# Patient Record
Sex: Female | Born: 1965 | Race: White | Hispanic: No | Smoking: Former smoker
Health system: Southern US, Community
[De-identification: ages and names within clinical notes are randomized; demographics above are authoritative.]

## PROBLEM LIST (undated history)

## (undated) DIAGNOSIS — O24419 Gestational diabetes mellitus in pregnancy, unspecified control: Secondary | ICD-10-CM

## (undated) DIAGNOSIS — R87619 Unspecified abnormal cytological findings in specimens from cervix uteri: Secondary | ICD-10-CM

## (undated) DIAGNOSIS — M2550 Pain in unspecified joint: Secondary | ICD-10-CM

## (undated) DIAGNOSIS — K589 Irritable bowel syndrome without diarrhea: Secondary | ICD-10-CM

## (undated) DIAGNOSIS — F329 Major depressive disorder, single episode, unspecified: Secondary | ICD-10-CM

## (undated) DIAGNOSIS — F419 Anxiety disorder, unspecified: Secondary | ICD-10-CM

## (undated) DIAGNOSIS — F32A Depression, unspecified: Secondary | ICD-10-CM

## (undated) DIAGNOSIS — IMO0001 Reserved for inherently not codable concepts without codable children: Secondary | ICD-10-CM

## (undated) DIAGNOSIS — G8929 Other chronic pain: Secondary | ICD-10-CM

## (undated) DIAGNOSIS — K219 Gastro-esophageal reflux disease without esophagitis: Secondary | ICD-10-CM

## (undated) DIAGNOSIS — A0472 Enterocolitis due to Clostridium difficile, not specified as recurrent: Secondary | ICD-10-CM

## (undated) HISTORY — PX: ESOPHAGOGASTRODUODENOSCOPY: SHX1529

## (undated) HISTORY — DX: Gastro-esophageal reflux disease without esophagitis: K21.9

## (undated) HISTORY — DX: Depression, unspecified: F32.A

## (undated) HISTORY — PX: TONSILLECTOMY: SUR1361

## (undated) HISTORY — PX: SHOULDER SURGERY: SHX246

## (undated) HISTORY — DX: Anxiety disorder, unspecified: F41.9

## (undated) HISTORY — PX: BREAST BIOPSY: SHX20

## (undated) HISTORY — DX: Unspecified abnormal cytological findings in specimens from cervix uteri: R87.619

## (undated) HISTORY — DX: Pain in unspecified joint: M25.50

## (undated) HISTORY — DX: Enterocolitis due to Clostridium difficile, not specified as recurrent: A04.72

## (undated) HISTORY — PX: REDUCTION MAMMAPLASTY: SUR839

## (undated) HISTORY — DX: Irritable bowel syndrome, unspecified: K58.9

## (undated) HISTORY — DX: Major depressive disorder, single episode, unspecified: F32.9

## (undated) HISTORY — DX: Gestational diabetes mellitus in pregnancy, unspecified control: O24.419

## (undated) HISTORY — DX: Other chronic pain: G89.29

## (undated) HISTORY — DX: Reserved for inherently not codable concepts without codable children: IMO0001

---

## 1991-01-03 HISTORY — PX: TUBAL LIGATION: SHX77

## 1993-01-02 HISTORY — PX: LAPAROSCOPIC CHOLECYSTECTOMY: SUR755

## 2000-01-03 HISTORY — PX: ABDOMINAL HYSTERECTOMY: SHX81

## 2001-04-11 ENCOUNTER — Encounter: Payer: Self-pay | Admitting: Obstetrics and Gynecology

## 2001-04-11 ENCOUNTER — Encounter: Admission: RE | Admit: 2001-04-11 | Discharge: 2001-04-11 | Payer: Self-pay | Admitting: Obstetrics and Gynecology

## 2002-04-28 ENCOUNTER — Ambulatory Visit (HOSPITAL_COMMUNITY): Admission: RE | Admit: 2002-04-28 | Discharge: 2002-04-28 | Payer: Self-pay | Admitting: Internal Medicine

## 2002-04-29 ENCOUNTER — Encounter: Payer: Self-pay | Admitting: Internal Medicine

## 2002-04-29 ENCOUNTER — Ambulatory Visit (HOSPITAL_COMMUNITY): Admission: RE | Admit: 2002-04-29 | Discharge: 2002-04-29 | Payer: Self-pay | Admitting: Internal Medicine

## 2002-09-06 ENCOUNTER — Encounter: Payer: Self-pay | Admitting: Family Medicine

## 2002-09-06 ENCOUNTER — Emergency Department (HOSPITAL_COMMUNITY): Admission: EM | Admit: 2002-09-06 | Discharge: 2002-09-06 | Payer: Self-pay | Admitting: *Deleted

## 2002-09-24 ENCOUNTER — Encounter: Payer: Self-pay | Admitting: Family Medicine

## 2002-09-24 ENCOUNTER — Ambulatory Visit (HOSPITAL_COMMUNITY): Admission: RE | Admit: 2002-09-24 | Discharge: 2002-09-24 | Payer: Self-pay | Admitting: Family Medicine

## 2002-09-30 ENCOUNTER — Encounter: Payer: Self-pay | Admitting: Family Medicine

## 2002-09-30 ENCOUNTER — Ambulatory Visit (HOSPITAL_COMMUNITY): Admission: RE | Admit: 2002-09-30 | Discharge: 2002-09-30 | Payer: Self-pay | Admitting: Family Medicine

## 2002-12-05 ENCOUNTER — Ambulatory Visit (HOSPITAL_COMMUNITY): Admission: RE | Admit: 2002-12-05 | Discharge: 2002-12-05 | Payer: Self-pay | Admitting: Gastroenterology

## 2003-01-03 HISTORY — PX: BACK SURGERY: SHX140

## 2003-01-15 ENCOUNTER — Ambulatory Visit (HOSPITAL_COMMUNITY): Admission: RE | Admit: 2003-01-15 | Discharge: 2003-01-15 | Payer: Self-pay | Admitting: Family Medicine

## 2003-03-26 ENCOUNTER — Inpatient Hospital Stay (HOSPITAL_COMMUNITY): Admission: RE | Admit: 2003-03-26 | Discharge: 2003-03-28 | Payer: Self-pay | Admitting: Neurosurgery

## 2006-06-18 ENCOUNTER — Encounter (HOSPITAL_COMMUNITY): Admission: RE | Admit: 2006-06-18 | Discharge: 2006-07-18 | Payer: Self-pay | Admitting: Family Medicine

## 2006-07-27 ENCOUNTER — Ambulatory Visit (HOSPITAL_COMMUNITY): Admission: RE | Admit: 2006-07-27 | Discharge: 2006-07-27 | Payer: Self-pay | Admitting: Obstetrics & Gynecology

## 2007-08-19 ENCOUNTER — Other Ambulatory Visit: Admission: RE | Admit: 2007-08-19 | Discharge: 2007-08-19 | Payer: Self-pay | Admitting: Obstetrics and Gynecology

## 2007-08-23 ENCOUNTER — Ambulatory Visit (HOSPITAL_COMMUNITY): Admission: RE | Admit: 2007-08-23 | Discharge: 2007-08-23 | Payer: Self-pay | Admitting: Obstetrics & Gynecology

## 2008-03-09 ENCOUNTER — Ambulatory Visit (HOSPITAL_COMMUNITY): Admission: RE | Admit: 2008-03-09 | Discharge: 2008-03-09 | Payer: Self-pay | Admitting: Family Medicine

## 2008-08-24 ENCOUNTER — Ambulatory Visit (HOSPITAL_COMMUNITY): Admission: RE | Admit: 2008-08-24 | Discharge: 2008-08-24 | Payer: Self-pay | Admitting: Obstetrics & Gynecology

## 2008-08-24 ENCOUNTER — Other Ambulatory Visit: Admission: RE | Admit: 2008-08-24 | Discharge: 2008-08-24 | Payer: Self-pay | Admitting: Obstetrics and Gynecology

## 2009-07-03 ENCOUNTER — Emergency Department (HOSPITAL_COMMUNITY): Admission: EM | Admit: 2009-07-03 | Discharge: 2009-07-03 | Payer: Self-pay | Admitting: Emergency Medicine

## 2009-08-26 ENCOUNTER — Ambulatory Visit (HOSPITAL_COMMUNITY): Admission: RE | Admit: 2009-08-26 | Discharge: 2009-08-26 | Payer: Self-pay | Admitting: Obstetrics & Gynecology

## 2009-11-09 ENCOUNTER — Ambulatory Visit (HOSPITAL_COMMUNITY): Admission: RE | Admit: 2009-11-09 | Discharge: 2009-11-09 | Payer: Self-pay | Admitting: Family Medicine

## 2010-01-02 HISTORY — PX: MASTOPEXY: SUR857

## 2010-03-20 LAB — BASIC METABOLIC PANEL WITH GFR
BUN: 9 mg/dL (ref 6–23)
CO2: 27 meq/L (ref 19–32)
Calcium: 9.2 mg/dL (ref 8.4–10.5)
Chloride: 107 meq/L (ref 96–112)
Creatinine, Ser: 0.74 mg/dL (ref 0.4–1.2)
GFR calc non Af Amer: >60 mL/min
Glucose, Bld: 96 mg/dL (ref 70–99)
Potassium: 3.9 meq/L (ref 3.5–5.1)
Sodium: 138 meq/L (ref 135–145)

## 2010-03-20 LAB — DIFFERENTIAL
Basophils Absolute: 0 10*3/uL (ref 0.0–0.1)
Basophils Relative: 0 % (ref 0–1)
Eosinophils Relative: 1 % (ref 0–5)
Monocytes Absolute: 0.3 10*3/uL (ref 0.1–1.0)

## 2010-03-20 LAB — CBC
HCT: 37.9 % (ref 36.0–46.0)
MCHC: 35.4 g/dL (ref 30.0–36.0)
MCV: 91.7 fL (ref 78.0–100.0)
RDW: 12.6 % (ref 11.5–15.5)

## 2010-05-20 NOTE — Consult Note (Signed)
NAME:  Angel Silva, Angel Silva                       ACCOUNT NO.:  [id number]   MEDICAL RECORD NO.:  [id number]                  PATIENT TYPE:   LOCATION:                                       FACILITY:   PHYSICIAN:  R. Roetta Sessions, M.D.              DATE OF BIRTH:  1965/01/11   DATE OF CONSULTATION:  DATE OF DISCHARGE:                                   CONSULTATION   REQUESTING PHYSICIAN:  Donna Bernard, M.D.   REASON FOR CONSULTATION:  Upper abdominal pain.   HISTORY OF PRESENT ILLNESS:  The patient is a pleasant, 45 year old  [name], patient of Dr. Lubertha South, who presents today for  further evaluation of upper abdominal pain. She states that she has had  epigastric/left upper quadrant abdominal pain which radiates into her back  intermittently for several years.  Over the last several months this has  been progressively worsening.  Dr. Jena Gauss saw her for similar pain in October  1998.  At that time she presented to the ED at [facility].  She had elevation of SGOT to 327, amylase 283, lipase 112, total bilirubin  1.9.  Ultrasound revealed a common bile duct measuring 6 mm but no obvious  choledocholithiasis.  It was felt that she may have developed biliary  pancreatitis due to a small calculus in the common bile duct which passed  through the ampulla of Vater.   The patient states that over the last several months he pain has been  worsening.  It occurs in her epigastric and left upper quadrant region and  radiates into her back.  This was severe sharp back pain. She notices it  particularly in the morning and sometimes it wakes her up at night.  It is  not necessarily related to eating.  She also has had a lot of bad  indigestion especially at nighttime.  She recently began taking her  prednisone daily and has noted some improvement of these symptoms. She  denies dysphagia or odynophagia.  Notably Prevacid has not improved her  upper abdominal  pain.  She is having a bowel movement every few days. She  has chronically had constipation.  She denies any melena or rectal bleeding.  She has gained about 15 pounds in the last several months.  She denies any  fever or chills.  She states that she was treated for H. pylori several  years ago which seemed to help some of symptoms similar to this.   CURRENT MEDICATIONS:  1. Prevacid 30 mg daily.  2. Sucralfate 1 g tablet q.a.c. and q.h.s.  3. Effexor XR 75 mg b.i.d.  4. Tylenol p.r.n.   ALLERGIES:  No known drug allergies.   PAST MEDICAL HISTORY:  1. Anxiety depression.  2. Stress induced hypertension.  3. Gastroesophageal reflux disease.   PAST SURGICAL HISTORY:  1. Tonsillectomy.  2. Cesarean section 1987.  3. Cholecystectomy for cholelithiasis in 1995.  4. Tubal ligation in 1993.  5. Exploratory surgery for lower abdominal pain in 2002 followed by a     partial hysterectomy.   FAMILY HISTORY:  Father has Crohn's disease; his family history is unknown  as he was adopted.  The patient's maternal aunt has a history of multiple  colonic polyps requiring frequent colonoscopy.   SOCIAL HISTORY:  She is divorced, has 3 children ages 22, 52, and 85.  She  is employed with [employer].  She has never been a smoker.  Denies any alcohol use.   REVIEW OF SYSTEMS:  Please see HPI for GI and General. CARDIOPULMONARY:  Denies any chest pain or shortness of breath.   PHYSICAL EXAMINATION:  VITAL SIGNS:  Weight 141.  Height 5 feet 3 inches.  Temperature 97.4, blood pressure 110/70, pulse 66.  GENERAL:  A pleasant, well-nourished, well-developed, [name] in no  acute distress.  SKIN:  Warm and dry.  No jaundice.  HEENT:  Conjunctivae are pink.  Sclerae anicteric.  Oropharyngeal mucosa  moist and pink.  No lesions, erythema or exudate.  No lymphadenopathy or  thyromegaly or carotid bruits.  CHEST:  Lungs are clear to auscultation.  CARDIOVASCULAR:  Cardiac exam  reveals regular rate and rhythm.  Normal S1,  S2.  No murmurs, rubs, or gallops.  ABDOMEN:  Positive bowel sounds, soft, nondistended.  She has moderate  epigastric tenderness to deep palpation. No organomegaly or masses.  No  rebound, tenderness or guarding.  EXTREMITIES:  No edema.   IMPRESSION:  The patient is a pleasant 45 year old lady who has had a  several year history of intermittent epigastric and left upper quadrant  abdominal pain.  In 1998 it is felt that she had biliary pancreatitis due to  a small calculus.  She did not require any interventional therapy.  In the  last month her upper abdominal pain seems to have been worsening.  She has  also had typical reflux symptoms which have responded to Prevacid.  Given  chronicity of symptoms, I feel that she should undergo EGD for further  evaluation of her symptoms.  Also, I am going to repeat her LFTs, amylase  and lipase; although I feel that it would be very unusual for her to have  developed a biliary pancreatitis at this point. I cannot rule out  pancreatitis due to other causes, however.   PLAN:  1. EGD in the near future.  2. LFTs, amylase, lipase, and a CBC.  3.     She will continue Prevacid 30 mg p.o. daily.  I will provide her with #30     samples. She will call when she finds out the preferred PPI therapy for     her drug coverage and will call in a prescription.  She may continue     sucralfate for now.  I would like to thank Dr. Donna Bernard for     allowing [place] to take part in the care of this patient.     Tana Coast, Pricilla Larsson, M.D.    LL/MEDQ  D:  04/22/2002  T:  04/22/2002  Job:  [id number]   cc:   R. Roetta Sessions, M.D.  [address]  [place]  [place] [address]  Fax: [phone]   W. Simone Curia, M.D.  [address]  [place]  [place] [address]  Fax: [phone]

## 2010-05-20 NOTE — Op Note (Signed)
NAME:  Angel Silva, Angel Silva                     ACCOUNT NO.:  [id number]   MEDICAL RECORD NO.:  [id number]                   PATIENT TYPE:  INP   LOCATION:  3004                                 FACILITY:  [facility]   PHYSICIAN:  Danae Orleans. Venetia Maxon, M.D.               DATE OF BIRTH:  [birth year]   DATE OF PROCEDURE:  03/26/2003  DATE OF DISCHARGE:                                 OPERATIVE REPORT   PREOPERATIVE DIAGNOSIS:  Herniated lumbar disc L5-S1 with spondylosis,  degenerative disc disease, and radiculopathy.   POSTOPERATIVE DIAGNOSIS:  Herniated lumbar disc L5-S1 with spondylosis,  degenerative disc disease, and radiculopathy.   PROCEDURE:  Bilateral L5-S1 laminectomies with L5 through S1 bilateral  discectomies with transverse lumbar interbody fusion with 8 mm carbon fiber  TLIF cage and morselized autograft and pedicle screw fixation L5 through S1  bilaterally with posterolateral arthrodesis.   SURGEON:  Danae Orleans. Venetia Maxon, M.D.   ASSISTANT:  Hewitt Shorts, M.D.   ANESTHESIA:  General endotracheal anesthesia.   ESTIMATED BLOOD LOSS:  100 mL.   COMPLICATIONS:  None.   DISPOSITION:  Recovery room.   INDICATIONS FOR PROCEDURE:  Angel Silva is a 45 year old woman with  low back pain and bilateral lower extremity pain with bilobed disc  herniation at L5-S1 with significant S1 nerve root compression bilaterally.  It was elected to take her to surgery for lumbar decompression and fusion at  the affected level.   DESCRIPTION OF PROCEDURE:  Angel Silva was brought to the operating room.  Following satisfactory uncomplicated induction of general endotracheal  anesthesia and placement of a intravenous  lines, the patient was placed in  the prone position on the operating room table.  A Foley catheter was placed  prior to turning.  Her low back was then prepped and draped in the usual  sterile fashion.  The area of planned incision was infiltrated with 0.25%  Marcaine and 0.5%  lidocaine with 1:200,000 epinephrine.  An incision was  made in the midline overlying the L5-S1 interspace and carried through to  the lumbodorsal fascia which was incised bilaterally.  Subperiosteal  dissection was performed exposing  the L5 transverse processes and sacral  ala bilaterally.  Self-retaining Versatack retractor was placed to  facilitate exposure.  After confirmatory lateral lumbar radiograph  demonstrating marker probes at the L5 transverse process and overlying the  L5-S1 interspace, the hemilaminectomy of L5 was performed bilaterally with  decompression of the lateral aspect of the thecal sac, L5 nerve root and the  lateral recess of the spinal canal.  Using loupe magnification, the S1 nerve  root was initially mobilized on the left which demonstrated a very large  disc herniation contained within the annulus.  This was then incised with a  15 blade and disc material was removed in a piecemeal fashion.  Large  osteophytes were taken down with an osteophyte tool and Kerrison rongeurs.  Subsequently, similar decompression was  performed on the right side and,  again, a very large foraminal disc herniation was identified and this was  removed.  An interspace TLIF spreader was then placed, initially an 8 mm  spreader and then a 9 mm spreader and this opened up the interspace  considerably.  Using a variety of TLIF box cutting curets and straight  curets, the endplates of L5 and S1 were stripped of residual disc material  and cartilaginous material was also removed.  Subsequently, an 8 mm trial  sizer was placed and this was found to fit snugly and the 8 mm lordotic  carbon fiber TLIF cage was then filled with morselized bone autograft mixed  with platelet concentrate from the Symphony system and this was tapped into  position and counter sunk appropriately.  It was positioned just at the  anterior aspect of the interspace.  The morselized bone autograft was then  used to fill  the interspace posteriorly and this was tapped into position.  Subsequently, using Alphatek pedicle screw fixation system, pedicle screws  were placed, 6.5 by 40 mm screws at the sacrum and 5.5 by 40 mm screws were  placed at L5.  All screws had excellent purchase and the position was  confirmed on AP and lateral fluoroscopy.  The 60 mm pre-lordosed rod was  then cut to two 30 mm pieces and this was placed over the screws and locking  caps were placed and torqued into position.  The construct was compressed  prior to torquing the screws final torque.  Morselized bone allograft which  was also reconstituted with Symphony platelet concentrating system was then  placed over the decorticated transverse processes of L5 and S1 bilaterally  and this was also tapped into position.  Prior to doing so, the wound was  copiously irrigated with Bacitracin saline.  The soft tissues were inspected  and found to be in good repair.  The self-retaining retractor was removed  and the lumbodorsal fascia was closed with #1 Vicryl sutures, the  subcutaneous tissues were reapproximated with 2-0 Vicryl interrupted  inverted sutures, and the skin edges were reapproximated with interrupted 3-  0 Vicryl subcuticular stitch.  The wound was dressed with Dermabond.  The  patient was extubated in the operating room and taken to the recovery room  in stable condition having tolerated the operation well.  Counts were  correct at the end of the case.                                               Danae Orleans. Venetia Maxon, M.D.    JDS/MEDQ  D:  03/26/2003  T:  03/27/2003  Job:  [id number]

## 2010-05-20 NOTE — Group Therapy Note (Signed)
   NAME:  Angel Silva, Angel Silva                     ACCOUNT NO.:  [id number]   MEDICAL RECORD NO.:  [id number]                   PATIENT TYPE:  EMS   LOCATION:  ED                                   FACILITY:  [facility]   PHYSICIAN:  Scott A. Gerda Diss, M.D.               DATE OF BIRTH:  March 23, 1965   DATE OF PROCEDURE:  DATE OF DISCHARGE:  09/06/2002                                   PROGRESS NOTE   EMERGENCY ROOM VISIT   CHIEF COMPLAINT:  Abdominal pain, discomfort.   HISTORY OF PRESENT ILLNESS:  This lady presents with severe abdominal pain  and discomfort present over the past couple of days, kept her up during the  night, felt nausea.  The patient has had ongoing abdominal pains and  discomfort for months that was treated as an outpatient and then about a  month ago had a surgical procedure done to place a stent in the pancreas via  ERCP.  This triggered a severe pancreatitis which in turn caused near-death  experience due to the complications of the pancreatitis.   PAST MEDICAL HISTORY:  See per above.   FAMILY HISTORY:  Noncontributory.   REVIEW OF SYSTEMS:  Negative headache, fever, chills, cough.  Positive  vomiting, positive severe upper abdominal pain.  Negative for diarrhea,  dysuria, urinary frequency, or swelling in the legs.   PHYSICAL EXAMINATION:  GENERAL:  Looks to feel ill.  HEENT:  Benign.  NECK:  Supple.  CHEST:  CTA, R&L.  HEART:  Regular.  ABDOMEN:  Soft with moderate tenderness in the epigastrium region.  No  guarding or rebound.  EXTREMITIES:  Warm, dry.  NEUROLOGIC:  Grossly normal.   LABORATORY DATA:  Shows a normal liver, lipase, CBC, MET-7.   ASSESSMENT AND PLAN:  Severe abdominal pain.  Do not find evidence of  pancreatitis reoccurring.  If the pain is to get worse she is to notify us.  Follow-up otherwise.  She can use Lortab 10 mg one q.4h. p.r.n. severe pain,  cautioned drowsiness, along with Protonix 40 mg one daily and she is to  follow up with Dr.  Lubertha South in the office in a couple of days, sooner if  any problems.     SAL/MEDQ  D:  09/11/2002  T:  09/11/2002  Job:  [id number]

## 2010-05-20 NOTE — Op Note (Signed)
   NAME:  Angel Silva, Angel Silva                     ACCOUNT NO.:  [id number]   MEDICAL RECORD NO.:  [id number]                   PATIENT TYPE:  AMB   LOCATION:  DAY                                  FACILITY:  [facility]   PHYSICIAN:  R. Roetta Sessions, M.D.              DATE OF BIRTH:  03-18-1965   DATE OF PROCEDURE:  04/28/2002  DATE OF DISCHARGE:                                 OPERATIVE REPORT   PROCEDURE:  Diagnostic esophagogastroduodenoscopy.   INDICATIONS:  The patient is a 45 year old lady with a history of  intermittent epigastric and left upper quadrant abdominal pain and a bout of  pancreatitis in 1998 felt to be biliary in origin.  She has typical reflux  symptoms.  She has had good improvement of the symptoms with a course of  Prevacid more recently as well as Carafate.  Her LFTs, amylase, lipase are  normal.  EGD is now being done to further evaluate her symptoms.  This  approach has been discussed with the patient previously and again at the  bedside.  The potential risks, benefits and alternatives have been reviewed,  questions answered and she is agreeable.  Please see my dictated H&P for  more information.   DESCRIPTION OF PROCEDURE:  Oxygen saturation, blood pressure, pulse and  respiration were monitored throughout the entire procedure.  Conscious  sedation with Versed 3 mg IV, Demerol 50 mg IV in divided doses.  The  instrument was the Olympus video tip adult gastroscope.   FINDINGS:  Esophagus:  Examination of the tubular esophagus revealed no  mucosal abnormalities.  The EG junction was easily traversed.  Stomach:  The gastric cavity was emptied and insufflated well with air.  A  thorough examination of the gastric mucosa including retroflexed view of the  proximal stomach and esophagogastric junction demonstrated no abnormalities.  Pylorus was patent and easily traversed.  Duodenum:  Bulb and second portion appeared normal.   THERAPY AND DIAGNOSTIC MANEUVERS:   None.  The patient tolerated the procedure well and was reactive after endoscopy.   IMPRESSION:  Normal esophagus, stomach and D1, D2.    RECOMMENDATIONS:  Will proceed with abdominal ultrasound.  Continue Carafate  and Prevacid for the time being.  Further recommendations to follow.                                                Jonathon Bellows, M.D.    RMR/MEDQ  D:  04/28/2002  T:  04/28/2002  Job:  [id number]   cc:   Donna Bernard, M.D.  [address]  [place]  [place] [address]  Fax: [phone]

## 2010-07-20 ENCOUNTER — Other Ambulatory Visit: Payer: Self-pay | Admitting: Obstetrics & Gynecology

## 2010-07-20 DIAGNOSIS — Z139 Encounter for screening, unspecified: Secondary | ICD-10-CM

## 2010-08-29 ENCOUNTER — Ambulatory Visit (HOSPITAL_COMMUNITY)
Admission: RE | Admit: 2010-08-29 | Discharge: 2010-08-29 | Disposition: A | Discharge status: Home / Self Care | Payer: BC Managed Care – PPO | Source: Ambulatory Visit | Attending: Obstetrics & Gynecology | Admitting: Obstetrics & Gynecology

## 2010-08-29 DIAGNOSIS — Z139 Encounter for screening, unspecified: Secondary | ICD-10-CM

## 2010-08-29 DIAGNOSIS — Z1231 Encounter for screening mammogram for malignant neoplasm of breast: Secondary | ICD-10-CM | POA: Insufficient documentation

## 2011-03-20 ENCOUNTER — Ambulatory Visit (HOSPITAL_COMMUNITY)
Admission: RE | Admit: 2011-03-20 | Discharge: 2011-03-20 | Disposition: A | Discharge status: Home / Self Care | Payer: BC Managed Care – PPO | Source: Ambulatory Visit | Attending: Physical Medicine and Rehabilitation | Admitting: Physical Medicine and Rehabilitation

## 2011-03-20 ENCOUNTER — Other Ambulatory Visit (HOSPITAL_COMMUNITY): Payer: Self-pay | Admitting: Physical Medicine and Rehabilitation

## 2011-03-20 DIAGNOSIS — M25569 Pain in unspecified knee: Secondary | ICD-10-CM | POA: Insufficient documentation

## 2011-03-20 DIAGNOSIS — M47812 Spondylosis without myelopathy or radiculopathy, cervical region: Secondary | ICD-10-CM

## 2011-03-20 DIAGNOSIS — M542 Cervicalgia: Secondary | ICD-10-CM

## 2011-07-04 IMAGING — MG MM DIGITAL SCREENING
4 series · 4 of 4 positions shown · non-contrast
Comparison: none

DG SCREEN MAMMOGRAM BILATERAL
Bilateral CC and MLO view(s) were taken.
Technologist: [REDACTED]

DIGITAL SCREENING MAMMOGRAM WITH CAD:
The breast tissue is heterogeneously dense.  No masses or malignant type calcifications are 
identified.  Compared with prior studies.
Images were processed with CAD.

[L CC]
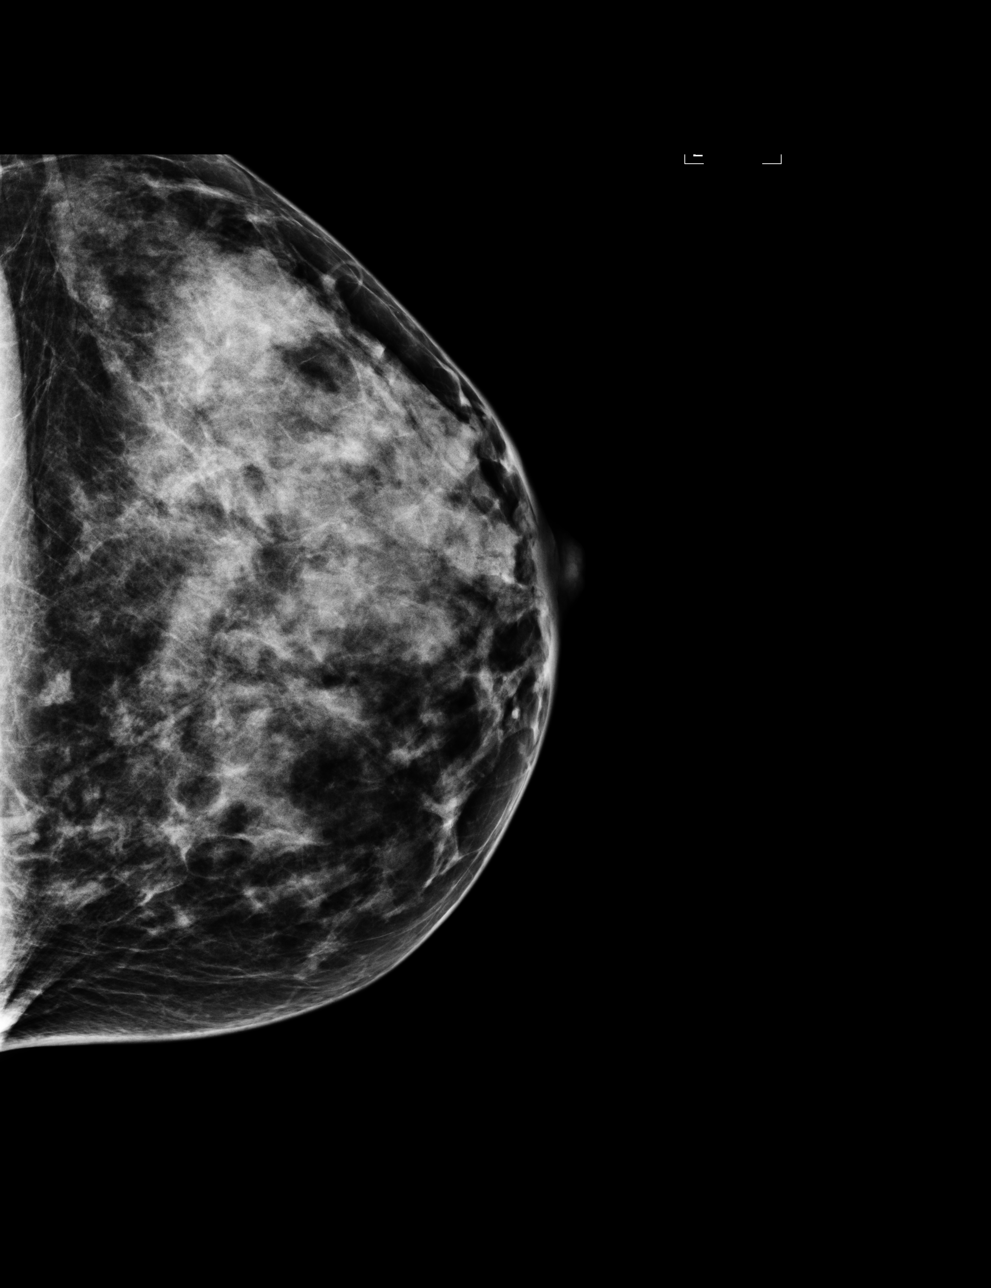

[L MLO]
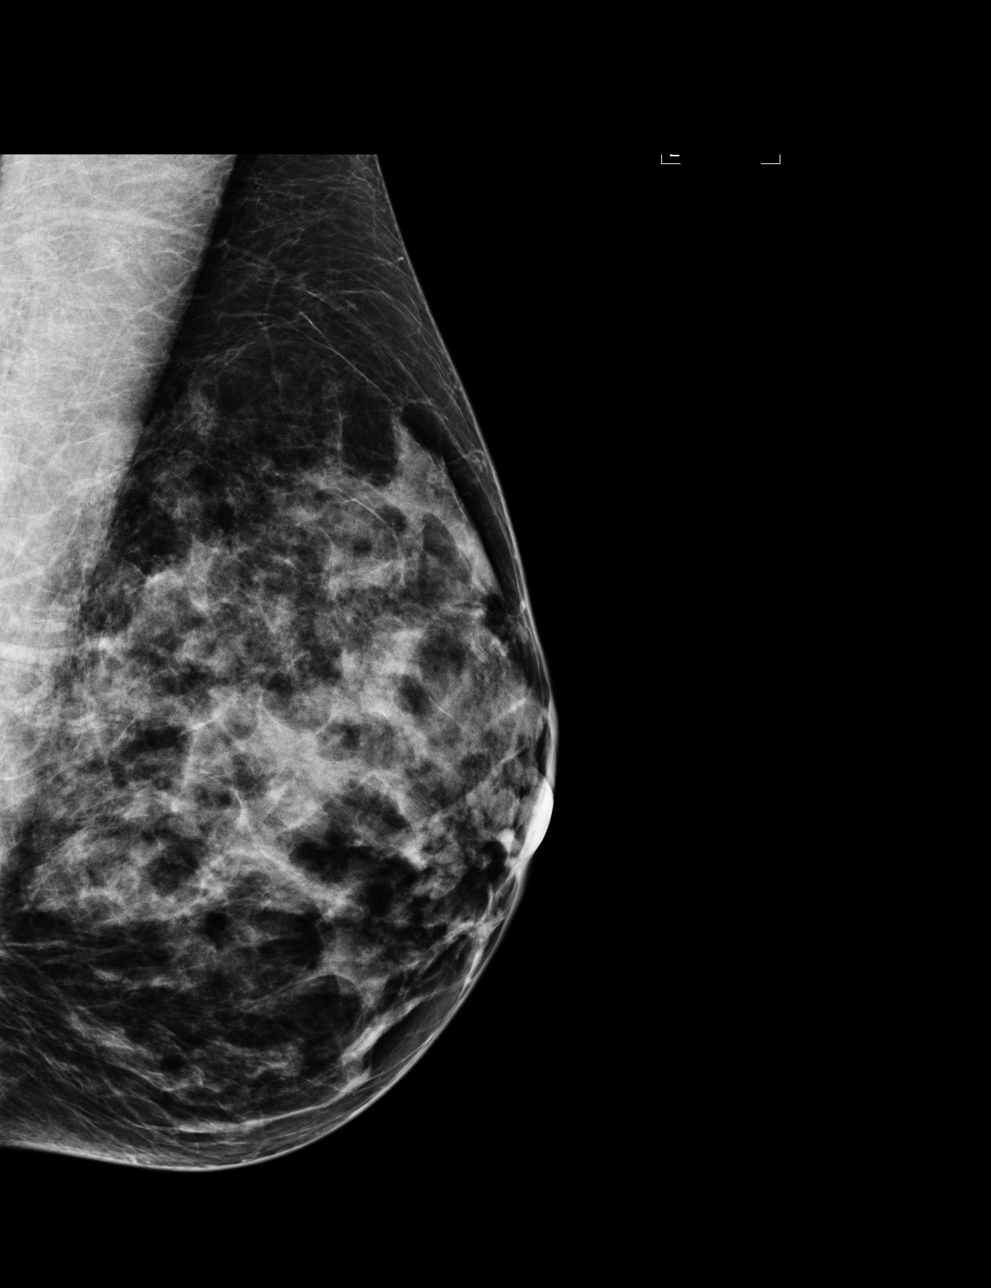

[R CC]
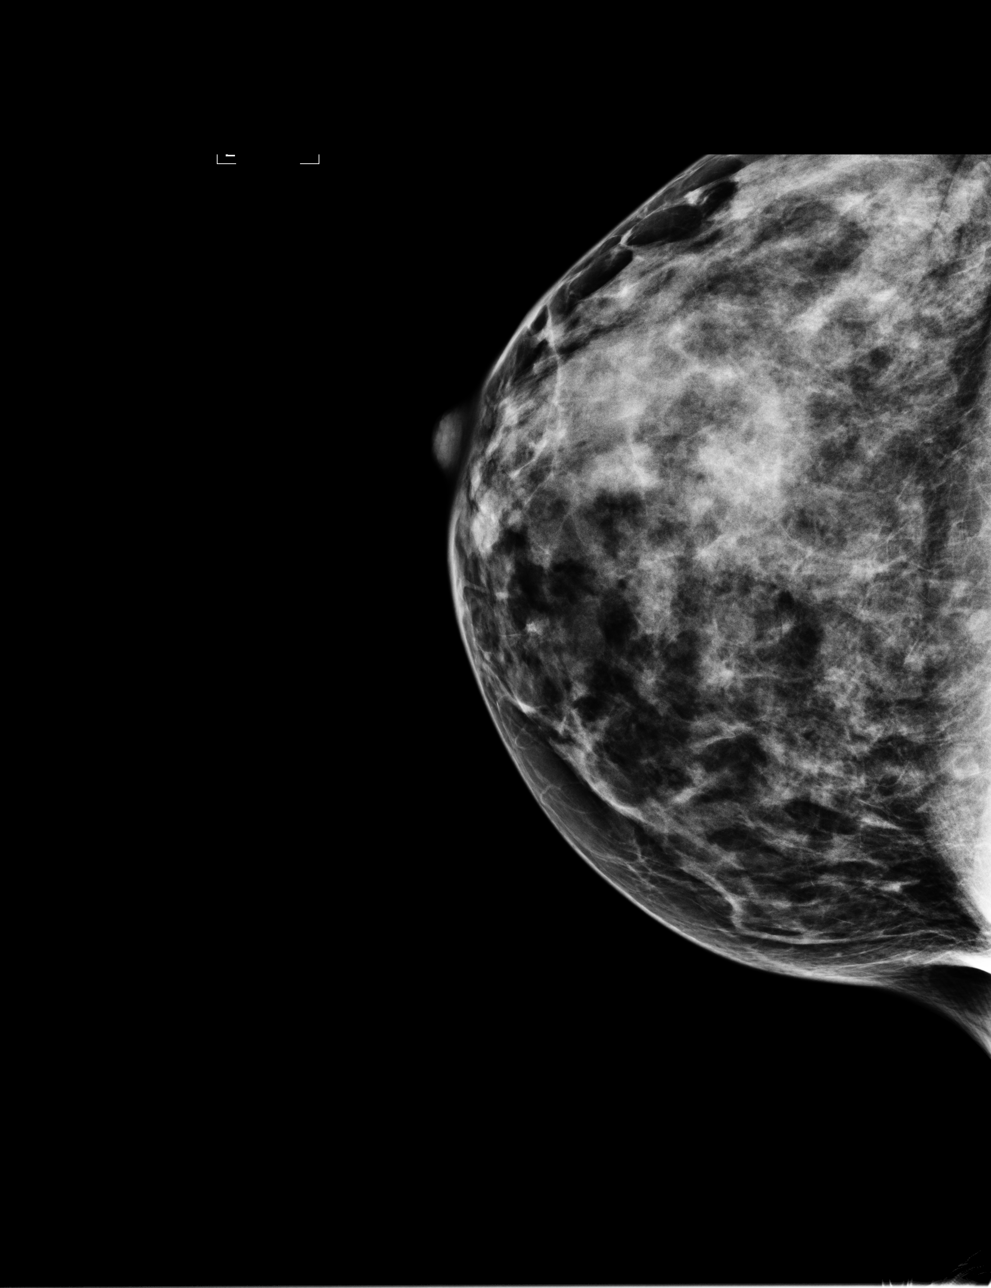

[R MLO]
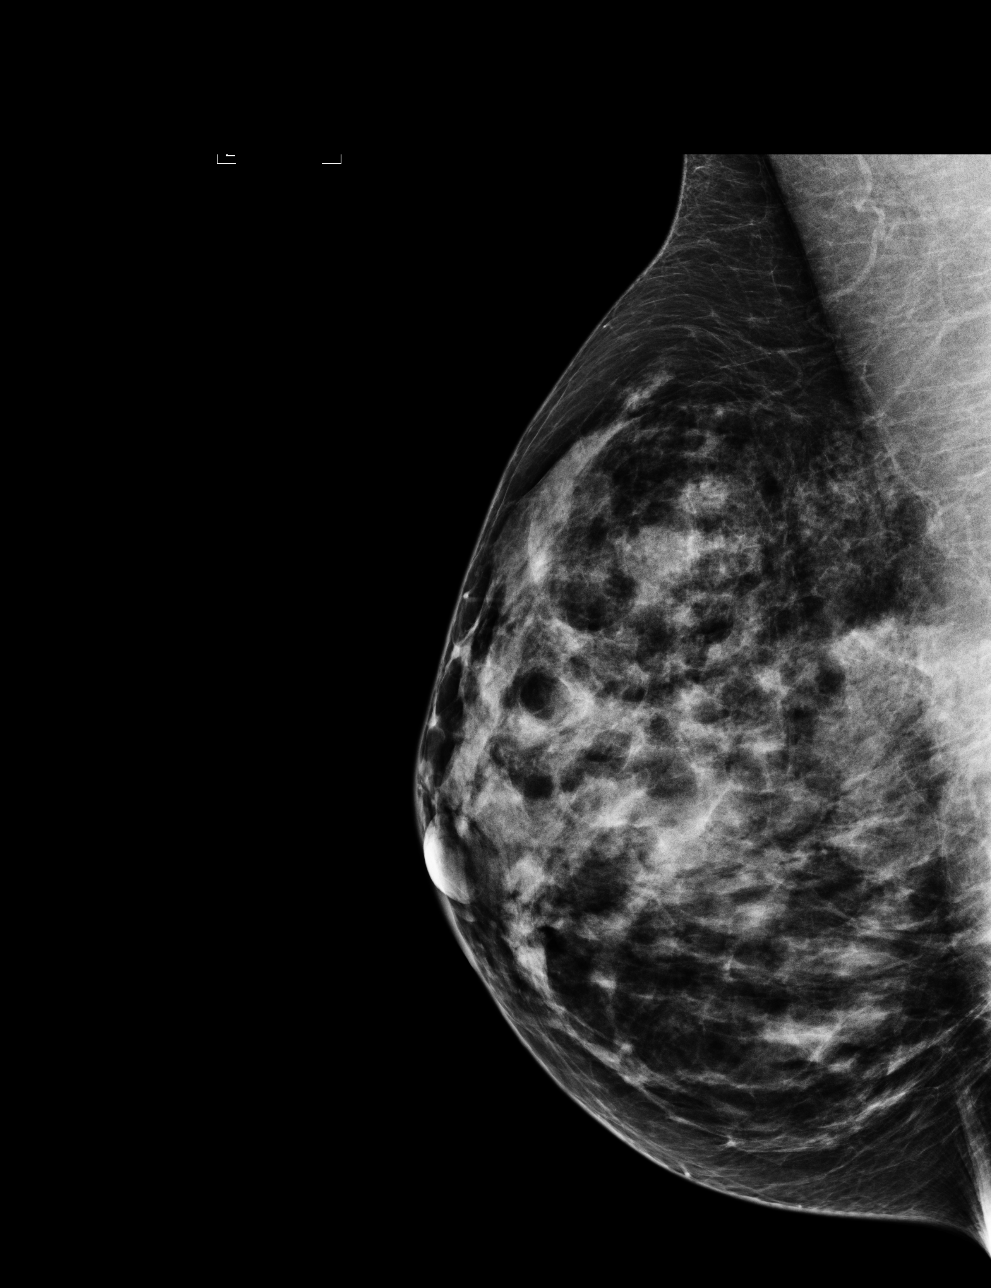

[4 of 4 positions shown; findings below may reference images not displayed]

IMPRESSION: No specific mammographic evidence of malignancy.  Next screening mammogram is recommended in one 
year.

A result letter of this screening mammogram will be mailed directly to the patient.

ASSESSMENT: Negative - BI-RADS 1

Screening mammogram in 1 year.
,

## 2012-03-01 ENCOUNTER — Other Ambulatory Visit (HOSPITAL_COMMUNITY): Payer: Self-pay | Admitting: Obstetrics and Gynecology

## 2012-03-01 DIAGNOSIS — Z139 Encounter for screening, unspecified: Secondary | ICD-10-CM

## 2012-03-07 ENCOUNTER — Ambulatory Visit (HOSPITAL_COMMUNITY)
Admission: RE | Admit: 2012-03-07 | Discharge: 2012-03-07 | Disposition: A | Discharge status: Home / Self Care | Payer: BC Managed Care – PPO | Source: Ambulatory Visit | Attending: Obstetrics and Gynecology | Admitting: Obstetrics and Gynecology

## 2012-03-07 DIAGNOSIS — Z139 Encounter for screening, unspecified: Secondary | ICD-10-CM

## 2012-03-07 DIAGNOSIS — Z1231 Encounter for screening mammogram for malignant neoplasm of breast: Secondary | ICD-10-CM | POA: Insufficient documentation

## 2012-05-09 ENCOUNTER — Encounter: Payer: Self-pay | Admitting: *Deleted

## 2012-05-10 ENCOUNTER — Ambulatory Visit: Payer: Self-pay | Admitting: Family Medicine

## 2012-05-29 ENCOUNTER — Ambulatory Visit (INDEPENDENT_AMBULATORY_CARE_PROVIDER_SITE_OTHER): Payer: BC Managed Care – PPO | Admitting: Family Medicine

## 2012-05-29 ENCOUNTER — Encounter: Payer: Self-pay | Admitting: Family Medicine

## 2012-05-29 VITALS — BP 132/88 | Temp 98.3°F | Wt 123.4 lb

## 2012-05-29 DIAGNOSIS — F329 Major depressive disorder, single episode, unspecified: Secondary | ICD-10-CM

## 2012-05-29 DIAGNOSIS — R05 Cough: Secondary | ICD-10-CM

## 2012-05-29 DIAGNOSIS — F32A Depression, unspecified: Secondary | ICD-10-CM

## 2012-05-29 DIAGNOSIS — R059 Cough, unspecified: Secondary | ICD-10-CM

## 2012-05-29 DIAGNOSIS — R053 Chronic cough: Secondary | ICD-10-CM

## 2012-05-29 DIAGNOSIS — K219 Gastro-esophageal reflux disease without esophagitis: Secondary | ICD-10-CM

## 2012-05-29 NOTE — Patient Instructions (Addendum)
Use your omeprazole and zyrtec daily for thirty days, and the antibiotic until finished

## 2012-05-29 NOTE — Progress Notes (Signed)
  Subjective:    Patient ID: Angel Silva, female    DOB: 10-Feb-1965, 47 y.o.   MRN: [id number]  Sinus Problem This is a chronic problem. The current episode started more than 1 month ago. The problem has been gradually worsening since onset. There has been no fever. Her pain is at a severity of 6/10. The pain is moderate. Associated symptoms include coughing, headaches and sinus pressure. Past treatments include acetaminophen (severe cold and sinus lallergy and mucinex.).   No sig change in reflux. Not nec cough associated.   Chem expsoure as work Printmaker too. Patient does not smoke. She has been having some challenges with allergies this spring. Cough is almost always at night.  Patient has headache frontal in nature, worse with positional change.  Review of Systems  HENT: Positive for sinus pressure.   Respiratory: Positive for cough.   Neurological: Positive for headaches.   ROS otherwise negative.    Objective:   Physical Exam Alert HEENT moderate nasal congestion neck supple. Lungs clear. Heart regular rate and rhythm. Possible frontal and maxillary tenderness to palpation.       Assessment & Plan:  Impression #1 chronic cough discussed at great length. With 4 months duration, patient advised that we need to cleared up with our initial approach. If this does not happen patient will require further workup. Multifactorial nature of chronic cough discussed at great length. 25 minutes spent most in discussion. Plan Zyrtec 10 mg each bedtime for a month. Omeprazole faithfully each morning for a month. Appropriate antibiotics prescribed. WSL

## 2012-05-30 ENCOUNTER — Other Ambulatory Visit: Payer: Self-pay | Admitting: *Deleted

## 2012-05-30 ENCOUNTER — Telehealth: Payer: Self-pay | Admitting: Family Medicine

## 2012-05-30 DIAGNOSIS — F32A Depression, unspecified: Secondary | ICD-10-CM | POA: Insufficient documentation

## 2012-05-30 DIAGNOSIS — R053 Chronic cough: Secondary | ICD-10-CM | POA: Insufficient documentation

## 2012-05-30 DIAGNOSIS — K219 Gastro-esophageal reflux disease without esophagitis: Secondary | ICD-10-CM | POA: Insufficient documentation

## 2012-05-30 DIAGNOSIS — F329 Major depressive disorder, single episode, unspecified: Secondary | ICD-10-CM | POA: Insufficient documentation

## 2012-05-30 DIAGNOSIS — R05 Cough: Secondary | ICD-10-CM | POA: Insufficient documentation

## 2012-05-30 MED ORDER — OMEPRAZOLE 40 MG PO CPDR: 40.0000 mg | DELAYED_RELEASE_CAPSULE | Freq: Every day | ORAL | Status: DC

## 2012-05-30 MED ORDER — CEFPROZIL 500 MG PO TABS: 500.0000 mg | ORAL_TABLET | Freq: Two times a day (BID) | ORAL | Status: DC

## 2012-05-30 MED ORDER — CETIRIZINE HCL 10 MG PO TABS: 10.0000 mg | ORAL_TABLET | Freq: Every day | ORAL | Status: DC

## 2012-05-30 NOTE — Telephone Encounter (Signed)
Pt to check with pharmacy later this morning.  Medications ordered.

## 2012-05-30 NOTE — Telephone Encounter (Signed)
Patient was seen on 05/29/12.  Patient called this AM to let us know [facility] in [place] did not receive her medications yesterday.  States she was informed there should be Antibiotic, Allergy Medication and Omeprazole 40mg .  Please call patient.  Thanks

## 2012-07-02 ENCOUNTER — Ambulatory Visit (INDEPENDENT_AMBULATORY_CARE_PROVIDER_SITE_OTHER): Payer: BC Managed Care – PPO | Admitting: Family Medicine

## 2012-07-02 ENCOUNTER — Encounter: Payer: Self-pay | Admitting: Family Medicine

## 2012-07-02 VITALS — BP 122/78 | Temp 98.1°F | Wt 124.0 lb

## 2012-07-02 DIAGNOSIS — R21 Rash and other nonspecific skin eruption: Secondary | ICD-10-CM

## 2012-07-02 MED ORDER — PREDNISONE (PAK) 10 MG PO TABS: 10.0000 mg | ORAL_TABLET | Freq: Every day | ORAL | Status: DC

## 2012-07-02 NOTE — Progress Notes (Signed)
  Subjective:    Patient ID: Angel Silva, female    DOB: 03-26-1965, 47 y.o.   MRN: [id number]  Poison [place]ce [place] This is a new problem. The current episode started in the past 7 days. The problem has been gradually worsening since onset. The rash is diffuse. The rash is characterized by blistering, redness, itchiness and burning.   History a reaction the past not this severe.   Review of Systems    no headache no chest pain no abdominal pain. ROS otherwise negative. Objective:   Physical Exam  Alert lungs clear. Heart regular rate and rhythm. HEENT intermittent macular rash. Some blistering. Similar on arm.      Assessment & Plan:  Impression reduced dermatitis-discussed. Plan prednisone taper. Topical agents discussed. WSL

## 2012-10-07 ENCOUNTER — Ambulatory Visit (INDEPENDENT_AMBULATORY_CARE_PROVIDER_SITE_OTHER): Payer: BC Managed Care – PPO | Admitting: Nurse Practitioner

## 2012-10-07 ENCOUNTER — Encounter: Payer: Self-pay | Admitting: Family Medicine

## 2012-10-07 ENCOUNTER — Encounter: Payer: Self-pay | Admitting: Nurse Practitioner

## 2012-10-07 VITALS — BP 122/86 | Ht 63.0 in | Wt 124.2 lb

## 2012-10-07 DIAGNOSIS — G4700 Insomnia, unspecified: Secondary | ICD-10-CM

## 2012-10-07 DIAGNOSIS — F329 Major depressive disorder, single episode, unspecified: Secondary | ICD-10-CM

## 2012-10-07 DIAGNOSIS — F32A Depression, unspecified: Secondary | ICD-10-CM

## 2012-10-07 DIAGNOSIS — Z634 Disappearance and death of family member: Secondary | ICD-10-CM

## 2012-10-07 DIAGNOSIS — Z733 Stress, not elsewhere classified: Secondary | ICD-10-CM

## 2012-10-07 DIAGNOSIS — F4321 Adjustment disorder with depressed mood: Secondary | ICD-10-CM

## 2012-10-07 MED ORDER — TEMAZEPAM 30 MG PO CAPS: 30.0000 mg | ORAL_CAPSULE | Freq: Every day | ORAL | Status: DC

## 2012-10-09 ENCOUNTER — Encounter: Payer: Self-pay | Admitting: Nurse Practitioner

## 2012-10-09 NOTE — Assessment & Plan Note (Signed)
.   temazepam (RESTORIL) 30 MG capsule    Sig: Take 1 capsule (30 mg total) by mouth at bedtime.    Dispense:  30 capsule    Refill:  5    Order Specific Question:  Supervising Provider    Answer:  Riccardo Dubin   Continue followup with mental health for other medications. Recheck here as needed.

## 2012-10-09 NOTE — Progress Notes (Signed)
Subjective:  Presents requesting a refill on her temazepam for sleep. Currently under care of mental health for other medications. Lost her father in August. Has noticed an increase in anxiety and depression symptoms. Denies suicidal or homicidal thoughts or ideation. Temazepam is not working as well as it did but does help some with sleep.  Objective:   BP 122/86  Ht 5\' 3"  (1.6 m)  Wt 124 lb 4 oz (56.359 kg)  BMI 22.02 kg/m2 NAD. Alert, oriented. Crying at times during office visit. Lungs clear. Heart regular rate rhythm. Thoughts logical coherent and relevant.  Assessment:Insomnia  Bereavement   Plan: Meds ordered this encounter  Medications  . temazepam (RESTORIL) 30 MG capsule    Sig: Take 1 capsule (30 mg total) by mouth at bedtime.    Dispense:  30 capsule    Refill:  5    Order Specific Question:  Supervising Provider    Answer:  Riccardo Dubin   Continue followup with mental health for other medications. Recheck here as needed.

## 2012-10-22 ENCOUNTER — Other Ambulatory Visit: Payer: Self-pay | Admitting: Family Medicine

## 2012-11-24 ENCOUNTER — Other Ambulatory Visit: Payer: Self-pay | Admitting: Family Medicine

## 2012-11-25 ENCOUNTER — Other Ambulatory Visit: Payer: Self-pay | Admitting: *Deleted

## 2012-11-25 MED ORDER — OMEPRAZOLE 40 MG PO CPDR: 40.0000 mg | DELAYED_RELEASE_CAPSULE | Freq: Every day | ORAL | Status: DC

## 2013-01-03 ENCOUNTER — Ambulatory Visit (HOSPITAL_COMMUNITY)
Admission: RE | Admit: 2013-01-03 | Discharge: 2013-01-03 | Disposition: A | Discharge status: Home / Self Care | Payer: BC Managed Care – PPO | Source: Ambulatory Visit | Attending: Family Medicine | Admitting: Family Medicine

## 2013-01-03 ENCOUNTER — Encounter: Payer: Self-pay | Admitting: Family Medicine

## 2013-01-03 ENCOUNTER — Ambulatory Visit (INDEPENDENT_AMBULATORY_CARE_PROVIDER_SITE_OTHER): Payer: BC Managed Care – PPO | Admitting: Family Medicine

## 2013-01-03 VITALS — BP 140/82 | Temp 98.4°F | Ht 63.0 in | Wt 129.8 lb

## 2013-01-03 DIAGNOSIS — R0609 Other forms of dyspnea: Secondary | ICD-10-CM

## 2013-01-03 DIAGNOSIS — R0789 Other chest pain: Secondary | ICD-10-CM | POA: Insufficient documentation

## 2013-01-03 DIAGNOSIS — R5381 Other malaise: Secondary | ICD-10-CM

## 2013-01-03 DIAGNOSIS — R5383 Other fatigue: Secondary | ICD-10-CM

## 2013-01-03 DIAGNOSIS — R079 Chest pain, unspecified: Secondary | ICD-10-CM

## 2013-01-03 DIAGNOSIS — R0600 Dyspnea, unspecified: Secondary | ICD-10-CM

## 2013-01-03 DIAGNOSIS — R0989 Other specified symptoms and signs involving the circulatory and respiratory systems: Secondary | ICD-10-CM

## 2013-01-03 NOTE — Progress Notes (Signed)
   Subjective:    Patient ID: Angel Silva, female    DOB: 12/24/1965, 48 y.o.   MRN: [id number]  HPI Patient arrives with tightness in chest, ear pain, congestion, cough and sore throat for a few weeks. Patient states this been going on for a few weeks. She states that there is been off and on tightness in the chest off and on shortness of breath sometimes occurs with activity sometimes at rest denies any sweats with it no crushing chest pain. Energy level otherwise fairly good. Been under some stress. Denies being depressed There is a family history of heart disease although not premature Feels tight and heavy in chest DOE over past couple months  Review of Systems  Constitutional: Positive for fatigue. Negative for activity change and appetite change.  Respiratory: Positive for chest tightness and shortness of breath. Negative for cough and wheezing.   Cardiovascular: Positive for chest pain and palpitations. Negative for leg swelling.  Gastrointestinal: Positive for constipation. Negative for abdominal pain, blood in stool and rectal pain.  Endocrine: Negative for polydipsia and polyphagia.  Genitourinary: Negative for urgency, frequency, decreased urine volume, vaginal bleeding, menstrual problem and pelvic pain.  Neurological: Negative for weakness.  Psychiatric/Behavioral: Negative for confusion.       Objective:   Physical Exam  Vitals reviewed. Constitutional: She appears well-nourished. No distress.  Cardiovascular: Normal rate, regular rhythm and normal heart sounds.   No murmur heard. Pulmonary/Chest: Effort normal and breath sounds normal. No respiratory distress.  Musculoskeletal: She exhibits no edema.  Lymphadenopathy:    She has no cervical adenopathy.  Neurological: She is alert. She exhibits normal muscle tone.  Psychiatric: Her behavior is normal.          Assessment & Plan:  Chest discomfort intermittent both with rest and with activity also dyspnea  on exertion we will go ahead and do some lab work including BNP d-dimer and also CBC thyroid function chest x-ray. EKG looked good. If lab work looks good next step would be to order echo of the heart plus also will schedule followup do not feel patient has coronary artery disease I don't feel she needs to see cardiology currently she is under some stress this could be stress related but we need to do these tests to rule out other problems. 25 minutes spent with patient. If worse call us.

## 2013-01-06 ENCOUNTER — Telehealth: Payer: Self-pay | Admitting: Family Medicine

## 2013-01-06 LAB — CBC WITH DIFFERENTIAL/PLATELET
BASOS PCT: 0 % (ref 0–1)
Basophils Absolute: 0 10*3/uL (ref 0.0–0.1)
Eosinophils Absolute: 0 10*3/uL (ref 0.0–0.7)
Eosinophils Relative: 1 % (ref 0–5)
HEMATOCRIT: 41.8 % (ref 36.0–46.0)
HEMOGLOBIN: 15 g/dL (ref 12.0–15.0)
LYMPHS PCT: 24 % (ref 12–46)
Lymphs Abs: 1.2 10*3/uL (ref 0.7–4.0)
MCH: 31.6 pg (ref 26.0–34.0)
MCHC: 35.9 g/dL (ref 30.0–36.0)
MCV: 88 fL (ref 78.0–100.0)
MONO ABS: 0.4 10*3/uL (ref 0.1–1.0)
MONOS PCT: 7 % (ref 3–12)
NEUTROS ABS: 3.4 10*3/uL (ref 1.7–7.7)
NEUTROS PCT: 68 % (ref 43–77)
Platelets: 276 10*3/uL (ref 150–400)
RBC: 4.75 MIL/uL (ref 3.87–5.11)
RDW: 12.7 % (ref 11.5–15.5)
WBC: 5 10*3/uL (ref 4.0–10.5)

## 2013-01-06 LAB — BASIC METABOLIC PANEL
BUN: 10 mg/dL (ref 6–23)
CALCIUM: 9.6 mg/dL (ref 8.4–10.5)
CO2: 27 mEq/L (ref 19–32)
CREATININE: 0.8 mg/dL (ref 0.50–1.10)
Chloride: 103 mEq/L (ref 96–112)
Glucose, Bld: 71 mg/dL (ref 70–99)
Potassium: 4.4 mEq/L (ref 3.5–5.3)
SODIUM: 137 meq/L (ref 135–145)

## 2013-01-06 LAB — TSH: TSH: 2.116 u[IU]/mL (ref 0.350–4.500)

## 2013-01-06 NOTE — Telephone Encounter (Signed)
Spoke with patient and explained to her that one of the tests (brain) is to be done in April, but the other ones could be done now. Pt verbalized understanding.

## 2013-01-06 NOTE — Telephone Encounter (Signed)
Patient was instructed by Dr Lorin PicketScott to have blood work over the weekend done, but [facility] that [facility] was some kind of internal note saying that she could not have this done until April .Marland Kitchen. Please advise.

## 2013-01-07 ENCOUNTER — Ambulatory Visit (HOSPITAL_COMMUNITY)
Admission: RE | Admit: 2013-01-07 | Discharge: 2013-01-07 | Disposition: A | Discharge status: Home / Self Care | Payer: BC Managed Care – PPO | Source: Ambulatory Visit | Attending: Family Medicine | Admitting: Family Medicine

## 2013-01-07 ENCOUNTER — Other Ambulatory Visit: Payer: Self-pay | Admitting: Family Medicine

## 2013-01-07 DIAGNOSIS — R079 Chest pain, unspecified: Secondary | ICD-10-CM

## 2013-01-07 DIAGNOSIS — R799 Abnormal finding of blood chemistry, unspecified: Secondary | ICD-10-CM | POA: Insufficient documentation

## 2013-01-07 DIAGNOSIS — R0602 Shortness of breath: Secondary | ICD-10-CM | POA: Insufficient documentation

## 2013-01-07 LAB — BRAIN NATRIURETIC PEPTIDE

## 2013-01-07 LAB — D-DIMER, QUANTITATIVE (NOT AT ARMC): D DIMER QUANT: 0.63 ug{FEU}/mL — AB (ref 0.00–0.48)

## 2013-01-07 MED ORDER — IOHEXOL 350 MG/ML SOLN
100.0000 mL | Freq: Once | INTRAVENOUS | Status: AC | PRN
  Administered 2013-01-07: 100 mL via INTRAVENOUS

## 2013-01-07 NOTE — Progress Notes (Signed)
Dr. Lorin PicketScott spoke with patient on the phone

## 2013-01-07 NOTE — Progress Notes (Signed)
Spoke with patient and told her she needs to have a STAT CT scan done of the chest d/t elevated D-Dimer. Pt verbalized understanding.

## 2013-01-07 NOTE — Addendum Note (Signed)
Addended byOneal Deputy: Rudolph Daoust D on: 01/07/2013 01:30 PM   Modules accepted: Orders

## 2013-01-08 ENCOUNTER — Encounter: Payer: Self-pay | Admitting: Family Medicine

## 2013-01-08 NOTE — Progress Notes (Signed)
Notified patient that she is set up for echo at [facility] next Wed (Jan 14) at 9:15am. Patient verbalized understanding.

## 2013-01-08 NOTE — Addendum Note (Signed)
Addended byOneal Deputy: Frederich Montilla D on: 01/08/2013 08:13 AM   Modules accepted: Orders

## 2013-01-15 ENCOUNTER — Ambulatory Visit (HOSPITAL_COMMUNITY)
Admission: RE | Admit: 2013-01-15 | Discharge: 2013-01-15 | Disposition: A | Discharge status: Home / Self Care | Payer: BC Managed Care – PPO | Source: Ambulatory Visit | Attending: Family Medicine | Admitting: Family Medicine

## 2013-01-15 DIAGNOSIS — R0600 Dyspnea, unspecified: Secondary | ICD-10-CM

## 2013-01-15 DIAGNOSIS — R072 Precordial pain: Secondary | ICD-10-CM

## 2013-01-15 DIAGNOSIS — R079 Chest pain, unspecified: Secondary | ICD-10-CM

## 2013-01-15 DIAGNOSIS — R0989 Other specified symptoms and signs involving the circulatory and respiratory systems: Secondary | ICD-10-CM | POA: Insufficient documentation

## 2013-01-15 DIAGNOSIS — R0609 Other forms of dyspnea: Secondary | ICD-10-CM | POA: Insufficient documentation

## 2013-01-15 NOTE — Progress Notes (Signed)
*  PRELIMINARY RESULTS* Echocardiogram 2D Echocardiogram has been performed.  Angel Silva 01/15/2013, 10:52 AM

## 2013-01-17 ENCOUNTER — Encounter: Payer: Self-pay | Admitting: Cardiology

## 2013-01-17 ENCOUNTER — Ambulatory Visit (INDEPENDENT_AMBULATORY_CARE_PROVIDER_SITE_OTHER): Payer: BC Managed Care – PPO | Admitting: Cardiology

## 2013-01-17 VITALS — BP 136/87 | HR 84 | Ht 63.0 in | Wt 131.0 lb

## 2013-01-17 DIAGNOSIS — K219 Gastro-esophageal reflux disease without esophagitis: Secondary | ICD-10-CM

## 2013-01-17 DIAGNOSIS — R0789 Other chest pain: Secondary | ICD-10-CM | POA: Insufficient documentation

## 2013-01-17 NOTE — Assessment & Plan Note (Signed)
Could potentially be aggravating at least some of her symptoms.

## 2013-01-17 NOTE — Assessment & Plan Note (Signed)
Present intermittently for the last few months, reportedly has been improving of late. Recent testing reviewed and overall reassuring including ECG, echocardiogram, and chest CT angiogram. She does have some family history of premature CAD on her mother's side. No known personal risk factors. PFTs are being considered with further primary care workup. Will obtain a basic GXT for ischemic screening, although obstructive CAD unlikely as etiology.

## 2013-01-17 NOTE — Patient Instructions (Signed)
Your physician recommends that you schedule a follow-up appointment in: to be determined  The office will call you of your results  Your physician has requested that you have an exercise tolerance test. For further information please visit [email or web address]. Please also follow instruction sheet, as given.

## 2013-01-17 NOTE — Progress Notes (Signed)
Clinical Summary Ms. Jodelle GreenWhitley is a 48 y.o.female referred for cardiology consultation by Dr. Gerda DissLuking. She describes a two-month history of chest tightness and shortness of breath, fairly prolonged at times, not always with exertion. She seems to notice it more when she is in a hot environment, does not report any wheezing, occasionally has had some cough. She thought that it may be related to stress as she has been working long hours, and her father also passed away around the time that the symptoms began.  Recent ECG reviewed finding normal sinus rhythm. Echocardiogram from January 14 showed normal LVEF of 55-60% without wall motion abnormalities, normal diastolic function, no significant valvular abnormalities. Recent lab work shows potassium 4.4, BUN 10, creatinine 0.8, hemoglobin 15.0, platelets 276, TSH 2.1, normal BNP, d-dimer 0.63. CT and examined the chest also done recently showed no evidence of pulmonary embolus - overall normal findings.  She has never undergone any ischemic testing.   Allergies  Allergen Reactions  . Ativan [Lorazepam]     Side effects  . Effexor [Venlafaxine Hcl] Nausea Only  . Prozac [Fluoxetine Hcl]     Side effects  . Relafen [Nabumetone]     Side effects  . Xanax [Alprazolam]   . Zoloft [Sertraline Hcl] Rash    Current Outpatient Prescriptions  Medication Sig Dispense Refill  . buPROPion (WELLBUTRIN XL) 300 MG 24 hr tablet Take 300 mg by mouth daily.      . cetirizine (ZYRTEC ALLERGY) 10 MG tablet Take 1 tablet (10 mg total) by mouth daily.  30 tablet  5  . cycloSPORINE (RESTASIS) 0.05 % ophthalmic emulsion Place 1 drop into both eyes 2 (two) times daily.      . metroNIDAZOLE (METROGEL) 1 % gel Apply topically daily.      . Multiple Vitamin (MULTIVITAMIN) tablet Take 1 tablet by mouth daily.      Marland Kitchen. omeprazole (PRILOSEC) 40 MG capsule Take 1 capsule (40 mg total) by mouth daily.  90 capsule  1  . temazepam (RESTORIL) 30 MG capsule Take 1 capsule (30  mg total) by mouth at bedtime.  30 capsule  5  . Vortioxetine HBr (BRINTELLIX) 5 MG TABS Take by mouth.       No current facility-administered medications for this visit.    Past Medical History  Diagnosis Date  . IBS (irritable bowel syndrome)   . Reflux   . Chronic joint pain   . Depression   . Anxiety     Past Surgical History  Procedure Laterality Date  . Cesarean section    . Tonsillectomy    . Esophagogastroduodenoscopy    . Abdominal hysterectomy      Family History  Problem Relation Age of Onset  . Hypertension Mother   . Diabetes Mother   . Hypertension Father   . Heart attack Maternal Grandfather   . Atrial fibrillation Father   . Cancer Father   . CAD Maternal Uncle     Social History Ms. Jodelle GreenWhitley reports that she has never smoked. She does not have any smokeless tobacco history on file. Ms. Jodelle GreenWhitley reports that she does not drink alcohol.  Review of Systems No palpitations, dizziness, or syncope. No claudication. No orthopnea or PND. Stable appetite.   Physical Examination Filed Vitals:   01/17/13 0842  BP: 136/87  Pulse: 84   Filed Weights   01/17/13 0842  Weight: 131 lb (59.421 kg)   Normally nourished woman, appears comfortable at rest. HEENT: Conjunctiva and lids normal,  oropharynx clear. Neck: Supple, no elevated JVP or carotid bruits, no thyromegaly. Lungs: Clear to auscultation, nonlabored breathing at rest. Cardiac: Regular rate and rhythm, no S3 or significant systolic murmur, no pericardial rub. Abdomen: Soft, nontender, bowel sounds present, no guarding or rebound. Extremities: No pitting edema, distal pulses 2+. Skin: Warm and dry. Musculoskeletal: No kyphosis. Neuropsychiatric: Alert and oriented x3, affect grossly appropriate.   Problem List and Plan   Atypical chest pain Present intermittently for the last few months, reportedly has been improving of late. Recent testing reviewed and overall reassuring including ECG,  echocardiogram, and chest CT angiogram. She does have some family history of premature CAD on her mother's side. No known personal risk factors. PFTs are being considered with further primary care workup. Will obtain a basic GXT for ischemic screening, although obstructive CAD unlikely as etiology.  Esophageal reflux Could potentially be aggravating at least some of her symptoms.    Jonelle Sidle, M.D., F.A.C.C.

## 2013-01-20 ENCOUNTER — Encounter: Payer: Self-pay | Admitting: Cardiology

## 2013-01-24 ENCOUNTER — Encounter: Payer: Self-pay | Admitting: Cardiology

## 2013-01-24 ENCOUNTER — Ambulatory Visit (HOSPITAL_COMMUNITY)
Admission: RE | Admit: 2013-01-24 | Discharge: 2013-01-24 | Disposition: A | Discharge status: Home / Self Care | Payer: BC Managed Care – PPO | Source: Ambulatory Visit | Attending: Cardiology | Admitting: Cardiology

## 2013-01-24 ENCOUNTER — Telehealth: Payer: Self-pay

## 2013-01-24 DIAGNOSIS — R079 Chest pain, unspecified: Secondary | ICD-10-CM

## 2013-01-24 DIAGNOSIS — R0789 Other chest pain: Secondary | ICD-10-CM

## 2013-01-24 DIAGNOSIS — R0609 Other forms of dyspnea: Secondary | ICD-10-CM | POA: Insufficient documentation

## 2013-01-24 DIAGNOSIS — R0989 Other specified symptoms and signs involving the circulatory and respiratory systems: Secondary | ICD-10-CM | POA: Insufficient documentation

## 2013-01-24 DIAGNOSIS — R0600 Dyspnea, unspecified: Secondary | ICD-10-CM

## 2013-01-24 NOTE — Telephone Encounter (Signed)
Left message on patients personal vm that GXT normal

## 2013-01-24 NOTE — Progress Notes (Signed)
Stress Lab Nurses Notes - [facility]   Wynona NeatCatherine L Whitley  01/24/2013  Reason for doing test: Chest Pain and Dyspnea  Type of test: Regular GTX  Nurse performing test: Parke PoissonPhyllis Billingsly, RN  MD performing test: S. Divinity Kyler/K.LawrenceNP  Family MD: Lilyan PuntScott Luking  Test explained and consent signed: Yes  IV started: No IV started  Symptoms: Dizziness  Treatment/Intervention: None  Reason test stopped: Fatigue and reached target HR  After recovery IV was: NA  Patient discharged: Home  Patient's Condition upon discharge was: Stable  Comments: During test peak BP 164/86 & HR 160. Recovery BP 133/89 & HR 88. Symptoms resolved in recovery.  Erskine SpeedBillingsley, Phyllis T  Attending note:  Patient exercise on a Bruce protocol for 9:10 reaching peak HR 162 BPM, 93% MPHR. Maximum workload 10.6 METS. No chest pain was reported. Peak BP 164/86. No diagnostic ST segment changes or significant arrhythmia were noted. Negative for ischemia with low risk Duke treadmill score of 8.  Jonelle SidleSamuel G. Jiovani Mccammon, M.D., F.A.C.C.

## 2013-01-24 NOTE — Progress Notes (Signed)
Stress Lab Nurses Notes - [facility]  Wynona NeatCatherine L Whitley 01/24/2013 Reason for doing test: Chest Pain and Dyspnea Type of test: Regular GTX Nurse performing test: Parke PoissonPhyllis Billingsly, RN Nuclear Medicine Tech: Not Applicable Echo Tech: Not Applicable MD performing test: S. McDowell/K.LawrenceNP Family MD: Lilyan PuntScott Luking  Test explained and consent signed: yes IV started: No IV started Symptoms: Dizziness  Treatment/Intervention: None Reason test stopped: fatigue and reached target HR After recovery IV was: NA Patient to return to Nuc. Med at : NA Patient discharged: Home Patient's Condition upon discharge was: stable Comments: During test peak BP 164/86 & HR 160.  Recovery BP 133/89 & HR 88.  Symptoms resolved in recovery. Erskine SpeedBillingsley, Burns Timson T

## 2013-01-24 NOTE — Telephone Encounter (Signed)
Message copied by Nori RiisARLTON, Clover A on Fri Jan 24, 2013  2:59 PM ------      Message from: Jonelle SidleMCDOWELL, SAMUEL G      Created: Fri Jan 24, 2013  2:41 PM       See GXT report. Please let her know that test indicates low risk for cardiac events at this time.      She can followup with Dr. Gerda DissLuking.            ----- Message -----         From: Jonelle SidleSamuel G McDowell, MD         Sent: 01/24/2013   2:40 PM           To: Jonelle SidleSamuel G McDowell, MD                   ------

## 2013-02-05 ENCOUNTER — Encounter: Payer: Self-pay | Admitting: Obstetrics and Gynecology

## 2013-02-07 ENCOUNTER — Ambulatory Visit: Payer: Self-pay | Admitting: Gynecology

## 2013-02-07 ENCOUNTER — Ambulatory Visit: Payer: Self-pay | Admitting: Obstetrics and Gynecology

## 2013-02-07 ENCOUNTER — Telehealth: Payer: Self-pay | Admitting: Gynecology

## 2013-02-07 NOTE — Telephone Encounter (Signed)
Patient canceled her aex appointment today @9 :30 due to a "stomach bug". Patient rescheduled 05/14/13 @ 12:30 with Dr. Farrel GobbleLathrop.

## 2013-02-28 ENCOUNTER — Other Ambulatory Visit: Payer: Self-pay

## 2013-02-28 DIAGNOSIS — Z1231 Encounter for screening mammogram for malignant neoplasm of breast: Secondary | ICD-10-CM

## 2013-02-28 DIAGNOSIS — Z9882 Breast implant status: Secondary | ICD-10-CM

## 2013-03-14 ENCOUNTER — Ambulatory Visit
Admission: RE | Admit: 2013-03-14 | Discharge: 2013-03-14 | Disposition: A | Discharge status: Home / Self Care | Payer: BC Managed Care – PPO | Source: Ambulatory Visit

## 2013-03-14 DIAGNOSIS — Z1231 Encounter for screening mammogram for malignant neoplasm of breast: Secondary | ICD-10-CM

## 2013-03-14 DIAGNOSIS — Z9882 Breast implant status: Secondary | ICD-10-CM

## 2013-03-18 ENCOUNTER — Encounter: Payer: Self-pay | Admitting: Family Medicine

## 2013-03-18 ENCOUNTER — Ambulatory Visit (INDEPENDENT_AMBULATORY_CARE_PROVIDER_SITE_OTHER): Payer: BC Managed Care – PPO | Admitting: Family Medicine

## 2013-03-18 VITALS — BP 128/80 | Temp 98.8°F | Ht 63.0 in | Wt 132.0 lb

## 2013-03-18 DIAGNOSIS — Z0289 Encounter for other administrative examinations: Secondary | ICD-10-CM

## 2013-03-18 DIAGNOSIS — T300 Burn of unspecified body region, unspecified degree: Secondary | ICD-10-CM

## 2013-03-18 MED ORDER — SILVER SULFADIAZINE 1 % EX CREA: 1.0000 "application " | TOPICAL_CREAM | Freq: Two times a day (BID) | CUTANEOUS | Status: DC
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
## 2013-03-19 ENCOUNTER — Encounter: Payer: Self-pay | Admitting: Family Medicine

## 2013-03-24 ENCOUNTER — Other Ambulatory Visit: Payer: Self-pay | Admitting: Family Medicine

## 2013-04-10 ENCOUNTER — Encounter: Payer: Self-pay | Admitting: Family Medicine

## 2013-04-10 ENCOUNTER — Ambulatory Visit (INDEPENDENT_AMBULATORY_CARE_PROVIDER_SITE_OTHER): Payer: BC Managed Care – PPO | Admitting: Family Medicine

## 2013-04-10 VITALS — BP 110/80 | Ht 63.0 in | Wt 132.0 lb

## 2013-04-10 DIAGNOSIS — F329 Major depressive disorder, single episode, unspecified: Secondary | ICD-10-CM

## 2013-04-10 DIAGNOSIS — K219 Gastro-esophageal reflux disease without esophagitis: Secondary | ICD-10-CM

## 2013-04-10 DIAGNOSIS — G4700 Insomnia, unspecified: Secondary | ICD-10-CM

## 2013-04-10 DIAGNOSIS — F32A Depression, unspecified: Secondary | ICD-10-CM

## 2013-04-10 DIAGNOSIS — F3289 Other specified depressive episodes: Secondary | ICD-10-CM

## 2013-04-10 DIAGNOSIS — R0789 Other chest pain: Secondary | ICD-10-CM

## 2013-04-10 NOTE — Patient Instructions (Signed)
Every other day for thirty days  Every third day for 30 days  Then stop the med  If in three mo would like to stop wellbutrin cal or come in (might not be a good idea)

## 2013-04-10 NOTE — Progress Notes (Signed)
   Subjective:    Patient ID: Angel Silva, female    DOB: 11/21/1965, 48 y.o.   MRN: [id number]  HPIFollow up on depression. Has been seeing a specialist. Patient wants to come off of meds. Taking wellbutrin and Brintellix.   Feels clumsy and unsteady and naueseated, feels it most with the mreds  Feeling anxious about the meds in terms of liver   Depression is almost not there  Feels like feeling no emotions  Work going good but working a lot of hours, but can back off  Anxiety acts up at times   Patient notes trouble sleeping at times. This is worsened by anxiety. Feels overall Wellbutrin is still helping this.  Working tremendous hours and not exercising regularly unfortunately.  Overall reflux is stable and in good control medications.  Insomnia stable and in good control with current medications. Review of Systems No headache no chest pain no back pain no abdominal pain no change in bowel habits no blood in stool    Objective:   Physical Exam  Alert no acute distress. Lungs clear. Heart regular in rhythm. HEENT normal. Neuro exam intact.      Assessment & Plan:  Impression 1 unsteadiness and imbalance patient relates to #3 #2 patient notes nausea patient relates #3. #3 now on new medicine called brintellex. for depression. Patient feels not helping and causing side effects. #4 reflux overall stable. #5 insomnia but stable. #6 chronic anxiety ongoing. #7 depression patient states not present. Plan wean off current medication. Every other day for a month then every third day for month then stop. Exercise strongly encourage. Maintain Wellbutrin. Maintain Restoril. Maintain Prilosec exercise discussed easily 25 minutes spent most in discussion. WSL

## 2013-04-11 DIAGNOSIS — G4700 Insomnia, unspecified: Secondary | ICD-10-CM | POA: Insufficient documentation

## 2013-05-01 ENCOUNTER — Telehealth: Payer: Self-pay | Admitting: Family Medicine

## 2013-05-01 MED ORDER — OMEPRAZOLE 40 MG PO CPDR: 40.0000 mg | DELAYED_RELEASE_CAPSULE | Freq: Every day | ORAL | Status: DC

## 2013-05-01 NOTE — Telephone Encounter (Signed)
done

## 2013-05-01 NOTE — Telephone Encounter (Signed)
Patient needs Rx for omeprazole DR 40 mg capsule

## 2013-05-14 ENCOUNTER — Ambulatory Visit: Payer: Self-pay | Admitting: Gynecology

## 2013-05-27 ENCOUNTER — Encounter: Payer: Self-pay | Admitting: Gynecology

## 2013-05-27 ENCOUNTER — Ambulatory Visit (INDEPENDENT_AMBULATORY_CARE_PROVIDER_SITE_OTHER): Payer: BC Managed Care – PPO | Admitting: Gynecology

## 2013-05-27 VITALS — BP 106/72 | HR 70 | Resp 16 | Ht 62.25 in | Wt 128.0 lb

## 2013-05-27 DIAGNOSIS — Z01419 Encounter for gynecological examination (general) (routine) without abnormal findings: Secondary | ICD-10-CM

## 2013-05-27 DIAGNOSIS — Z0000 Encounter for general adult medical examination without abnormal findings: Secondary | ICD-10-CM

## 2013-05-27 LAB — POCT URINALYSIS DIPSTICK
Bilirubin, UA: NEGATIVE
Blood, UA: NEGATIVE
Glucose, UA: NEGATIVE
Ketones, UA: NEGATIVE
LEUKOCYTES UA: NEGATIVE
Nitrite, UA: NEGATIVE
Protein, UA: NEGATIVE
UROBILINOGEN UA: NEGATIVE
pH, UA: 5

## 2013-05-27 NOTE — Patient Instructions (Signed)

## 2013-05-27 NOTE — Progress Notes (Signed)
48 y.o.divorced [name]   [phone] here for annual exam. Pt is not currently sexually active.  No dyspareunia.  Pt denies hot flashes.    Patient's last menstrual period was 01/03/2000.          Sexually active: no  The current method of family planning is status post hysterectomy.    Exercising: yes  walking Last pap: 2012 neg Mammo:03-14-13 normal BiRADS 1 Alcohol:none Tobacco:none BSE: not done Poct urine-neg   Health Maintenance  Topic Date Due  . Pap Smear  01/02/2013  . Influenza Vaccine  08/02/2013  . Tetanus/tdap  02/01/2022    Family History  Problem Relation Age of Onset  . Hypertension Mother   . Diabetes Mother     Type 2  . Thyroid disease Mother   . Hypertension Father   . Atrial fibrillation Father   . Cancer Father     bladder & liver  . Heart disease Father   . Crohn's disease Father   . Heart attack Maternal Grandfather   . CAD Maternal Uncle   . Hypertension Brother   . Diabetes Maternal Grandmother     Type 2  . Hypertension Maternal Grandmother   . Heart disease Maternal Grandmother   . Heart disease Paternal Grandmother     Patient Active Problem List   Diagnosis Date Noted  . Insomnia 04/11/2013  . Atypical chest pain 01/17/2013  . Dyspnea 01/03/2013  . Esophageal reflux 05/30/2012  . Depression 05/30/2012  . Chronic cough 05/30/2012    Past Medical History  Diagnosis Date  . IBS (irritable bowel syndrome)   . Reflux   . Chronic joint pain   . Depression   . Anxiety   . GDM (gestational diabetes mellitus)     History    Past Surgical History  Procedure Laterality Date  . Tonsillectomy    . Esophagogastroduodenoscopy    . Cesarean section  1987  . Back surgery  2005    L5-S1 Fusion  . Mastopexy  2012  . Tubal ligation  1993    BTSP  . Laparoscopic cholecystectomy  1995  . Abdominal hysterectomy  2002    TAH- Fibroids , endometriosis; ov retained    Allergies: Ativan; Effexor; Prozac; Relafen; Xanax; and  Zoloft  Current Outpatient Prescriptions  Medication Sig Dispense Refill  . Acetaminophen (TYLENOL PO) Take by mouth as needed.      Marland Kitchen. buPROPion (WELLBUTRIN XL) 300 MG 24 hr tablet Take 300 mg by mouth daily.      . cetirizine (ZYRTEC ALLERGY) 10 MG tablet Take 1 tablet (10 mg total) by mouth daily.  30 tablet  5  . cycloSPORINE (RESTASIS) 0.05 % ophthalmic emulsion Place 1 drop into both eyes 2 (two) times daily.      . Flaxseed, Linseed, (FLAX SEEDS PO) Take by mouth.      . Multiple Vitamin (MULTIVITAMIN) tablet Take 1 tablet by mouth daily.      Marland Kitchen. omeprazole (PRILOSEC) 40 MG capsule Take 1 capsule (40 mg total) by mouth daily.  90 capsule  1  . OVER THE COUNTER MEDICATION Vit A, Vit E, Vit B 12, Lutien      . temazepam (RESTORIL) 30 MG capsule Take 1 capsule (30 mg total) by mouth at bedtime.  30 capsule  5   No current facility-administered medications for this visit.    ROS: Pertinent items are noted in HPI.  Exam:    BP 106/72  Pulse 70  Resp 16  Ht 5' 2.25" (1.581 m)  Wt 128 lb (58.06 kg)  BMI 23.23 kg/m2  LMP 01/03/2000 Weight change: @WEIGHTCHANGE @ Last 3 height recordings:  Ht Readings from Last 3 Encounters:  05/27/13 5' 2.25" (1.581 m)  04/10/13 5\' 3"  (1.6 m)  03/18/13 5\' 3"  (1.6 m)   General appearance: alert, cooperative and appears stated age Head: Normocephalic, without obvious abnormality, atraumatic Neck: no adenopathy, no carotid bruit, no JVD, supple, symmetrical, trachea midline and thyroid not enlarged, symmetric, no tenderness/mass/nodules Lungs: clear to auscultation bilaterally Breasts: normal appearance, no masses or tenderness Heart: regular rate and rhythm, S1, S2 normal, no murmur, click, rub or gallop Abdomen: soft, non-tender; bowel sounds normal; no masses,  no organomegaly Extremities: extremities normal, atraumatic, no cyanosis or edema Skin: Skin color, texture, turgor normal. No rashes or lesions Lymph nodes: Cervical, supraclavicular,  and axillary nodes normal. no inguinal nodes palpated Neurologic: Grossly normal   Pelvic: External genitalia:  normal escutcheon              Urethra: normal appearing urethra with no masses, tenderness or lesions              Bartholins and Skenes: Bartholin's, Urethra, Skene's normal                 Vagina: normal appearing vagina with normal color and discharge, no lesions              Cervix: absent              Pap taken: no        Bimanual Exam:  Uterus:  absent                                      Adnexa:    normal adnexa in size, nontender and no masses                                      Rectovaginal: Confirms                                      Anus:  normal sphincter tone, no lesions  A: well woman      P: mammogram pap smear guidelines reviewed counseled on breast self exam, mammography screening, menopause, adequate intake of calcium and vitamin D, diet and exercise return annually or prn   An After Visit Summary was printed and given to the patient.

## 2013-06-30 ENCOUNTER — Telehealth: Payer: Self-pay | Admitting: Family Medicine

## 2013-06-30 MED ORDER — TEMAZEPAM 30 MG PO CAPS: 30.0000 mg | ORAL_CAPSULE | Freq: Every day | ORAL | Status: DC

## 2013-06-30 NOTE — Telephone Encounter (Signed)
[facility].

## 2013-06-30 NOTE — Telephone Encounter (Signed)
Ok one mo 

## 2013-06-30 NOTE — Telephone Encounter (Signed)
Pt does need a refill on her temazepam (RESTORIL) 30 MG capsule  She can't get in to be seen till 7/8 but will be out of her med on 7/3  Last filled 6/2 on or around pt didn't really know for sure   [facility]

## 2013-07-03 ENCOUNTER — Telehealth: Payer: Self-pay | Admitting: Family Medicine

## 2013-07-03 NOTE — Telephone Encounter (Signed)
[facility] notified[facility]

## 2013-07-03 NOTE — Telephone Encounter (Signed)
[facility] said that [facility] says that they still have not received her Rx for temazepam (RESTORIL) 30 MG capsule. Can we send this back over?

## 2013-07-09 ENCOUNTER — Ambulatory Visit (INDEPENDENT_AMBULATORY_CARE_PROVIDER_SITE_OTHER): Payer: BC Managed Care – PPO | Admitting: Family Medicine

## 2013-07-09 ENCOUNTER — Encounter: Payer: Self-pay | Admitting: Family Medicine

## 2013-07-09 VITALS — BP 118/84 | Ht 62.25 in | Wt 130.0 lb

## 2013-07-09 DIAGNOSIS — R0989 Other specified symptoms and signs involving the circulatory and respiratory systems: Secondary | ICD-10-CM

## 2013-07-09 DIAGNOSIS — F3289 Other specified depressive episodes: Secondary | ICD-10-CM

## 2013-07-09 DIAGNOSIS — K219 Gastro-esophageal reflux disease without esophagitis: Secondary | ICD-10-CM

## 2013-07-09 DIAGNOSIS — R0789 Other chest pain: Secondary | ICD-10-CM

## 2013-07-09 DIAGNOSIS — G8929 Other chronic pain: Secondary | ICD-10-CM

## 2013-07-09 DIAGNOSIS — F32A Depression, unspecified: Secondary | ICD-10-CM

## 2013-07-09 DIAGNOSIS — F329 Major depressive disorder, single episode, unspecified: Secondary | ICD-10-CM

## 2013-07-09 DIAGNOSIS — M25512 Pain in left shoulder: Secondary | ICD-10-CM

## 2013-07-09 DIAGNOSIS — R0609 Other forms of dyspnea: Secondary | ICD-10-CM

## 2013-07-09 DIAGNOSIS — M25519 Pain in unspecified shoulder: Secondary | ICD-10-CM

## 2013-07-09 DIAGNOSIS — R0600 Dyspnea, unspecified: Secondary | ICD-10-CM

## 2013-07-09 MED ORDER — CLONAZEPAM 1 MG PO TABS: 1.0000 mg | ORAL_TABLET | Freq: Four times a day (QID) | ORAL | Status: DC | PRN

## 2013-07-09 MED ORDER — TEMAZEPAM 30 MG PO CAPS: 30.0000 mg | ORAL_CAPSULE | Freq: Every day | ORAL | Status: DC

## 2013-07-09 MED ORDER — BUPROPION HCL ER (XL) 300 MG PO TB24: 300.0000 mg | ORAL_TABLET | Freq: Every day | ORAL | Status: DC

## 2013-07-09 NOTE — Progress Notes (Signed)
   Subjective:    Patient ID: Angel Silva, female    DOB: 10/13/1965, 48 y.o.   MRN: [id number]  Anxiety Presents for follow-up visit. Symptoms include nervous/anxious behavior and panic.     Still having sig stress and feeling dosn about gfather's death last fall  Had anxiety this weekend and chest pressure  Had chest pressure and sob this weeekend, felt bad. Generally does not get chest pressure with exercise.  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 also advised to avoid repetitive activity involving left shoulder. And also to avoid any repetitive activity involving lifting greater than 25 pounds.  Flares up at times  Reflux better  Now off the other m h med.  Chronic labral tear, no more twenty five, no longer,    No sig p   Review of Systems  Psychiatric/Behavioral: The patient is nervous/anxious.    No nausea no diaphoresis no abdominal pain no loss of consciousness    Objective:   Physical Exam  Alert no apparent distress vitals stable. Lungs clear. Heart regular in rhythm. Left shoulder compromise range of motion. Positive impingement sign. Inability to completely rotate. Ankles without edema.      Assessment & Plan:  Impression dyspnea likely related to #3 highly doubt significant pathology hold off on further workup discussed #2 chest pain highly likely related to #3 hold off on major workup at this time. Rationale discussed. #3 anxiety. With home and depression. #4 chronic shoulder pain. Compromise patient's ability to do certain work tasks. Patient requests a note in this regard. WSL

## 2013-07-11 ENCOUNTER — Telehealth: Payer: Self-pay | Admitting: *Deleted

## 2013-07-11 DIAGNOSIS — M25512 Pain in left shoulder: Secondary | ICD-10-CM

## 2013-07-11 DIAGNOSIS — G8929 Other chronic pain: Secondary | ICD-10-CM | POA: Insufficient documentation

## 2013-08-03 ENCOUNTER — Other Ambulatory Visit: Payer: Self-pay | Admitting: Family Medicine

## 2013-08-04 ENCOUNTER — Telehealth: Payer: Self-pay | Admitting: Family Medicine

## 2013-08-04 MED ORDER — TEMAZEPAM 30 MG PO CAPS: 30.0000 mg | ORAL_CAPSULE | Freq: Every day | ORAL | Status: DC

## 2013-08-04 NOTE — Telephone Encounter (Signed)
Patient called drugstore for refill on Tamazepam 30 mg.  She is out of refills and tonight is her last pill.  [facility]

## 2013-08-04 NOTE — Telephone Encounter (Signed)
Rx sent electronically to pharmacy. Patient notified. 

## 2013-08-04 NOTE — Telephone Encounter (Signed)
Ok for 6 mo worth 

## 2013-09-21 ENCOUNTER — Other Ambulatory Visit: Payer: Self-pay | Admitting: Family Medicine

## 2013-09-26 ENCOUNTER — Telehealth: Payer: Self-pay | Admitting: Family Medicine

## 2013-09-26 MED ORDER — CLONAZEPAM 1 MG PO TABS: 1.0000 mg | ORAL_TABLET | Freq: Four times a day (QID) | ORAL | Status: DC | PRN

## 2013-09-26 NOTE — Telephone Encounter (Signed)
Patient needs Rx for clonazePAM (KLONOPIN) 1 MG tablet.   [facility]

## 2013-09-26 NOTE — Telephone Encounter (Signed)
Ok plus three monthly ref 

## 2013-09-26 NOTE — Telephone Encounter (Signed)
Patient was notified that refill was approved and script will be faxed to pharmacy today.

## 2013-09-26 NOTE — Telephone Encounter (Signed)
Last seen 07/09/13  

## 2013-09-26 NOTE — Telephone Encounter (Signed)
Sorry change that to just one ref

## 2013-10-05 NOTE — Telephone Encounter (Signed)
Ok, may fill monthly six ref

## 2013-11-03 ENCOUNTER — Encounter: Payer: Self-pay | Admitting: Family Medicine

## 2014-01-09 ENCOUNTER — Ambulatory Visit: Payer: BC Managed Care – PPO | Admitting: Family Medicine

## 2014-01-09 ENCOUNTER — Ambulatory Visit (INDEPENDENT_AMBULATORY_CARE_PROVIDER_SITE_OTHER): Payer: BLUE CROSS/BLUE SHIELD | Admitting: Family Medicine

## 2014-01-09 ENCOUNTER — Encounter: Payer: Self-pay | Admitting: Family Medicine

## 2014-01-09 VITALS — BP 130/86 | Temp 98.4°F | Ht 62.25 in | Wt 132.0 lb

## 2014-01-09 DIAGNOSIS — B349 Viral infection, unspecified: Secondary | ICD-10-CM

## 2014-01-09 DIAGNOSIS — J0100 Acute maxillary sinusitis, unspecified: Secondary | ICD-10-CM

## 2014-01-09 MED ORDER — FLUCONAZOLE 150 MG PO TABS: 150.0000 mg | ORAL_TABLET | Freq: Once | ORAL | Status: DC

## 2014-01-09 MED ORDER — CEFPROZIL 500 MG PO TABS: 500.0000 mg | ORAL_TABLET | Freq: Two times a day (BID) | ORAL | Status: DC

## 2014-01-09 NOTE — Progress Notes (Signed)
   Subjective:    Patient ID: Angel Silva, female    DOB: 10/16/1965, 49 y.o.   MRN: [id number]  Sinusitis This is a new problem. The current episode started 1 to 4 weeks ago. The problem is unchanged. There has been no fever. The pain is moderate. Associated symptoms include congestion, coughing, ear pain, headaches, a hoarse voice, sneezing and a sore throat. Past treatments include acetaminophen (humidifer, cough drops). The treatment provided no relief.  Patient states that she has no concerns at this time.   PMH benign  Review of Systems  HENT: Positive for congestion, ear pain, hoarse voice, sneezing and sore throat.   Respiratory: Positive for cough.   Neurological: Positive for headaches.       Objective:   Physical Exam  Constitutional: She appears well-developed.  HENT:  Head: Normocephalic.  Nose: Nose normal.  Mouth/Throat: Oropharynx is clear and moist. No oropharyngeal exudate.  Neck: Neck supple.  Cardiovascular: Normal rate and normal heart sounds.   No murmur heard. Pulmonary/Chest: Effort normal and breath sounds normal. She has no wheezes.  Lymphadenopathy:    She has no cervical adenopathy.  Skin: Skin is warm and dry.  Nursing note and vitals reviewed.         Assessment & Plan:  Viral syndrome Sinusitis as a result of the virus Antibiotics prescribed Warning signs discussed Follow-up if ongoing troubles or worse.

## 2014-01-13 ENCOUNTER — Encounter: Payer: Self-pay | Admitting: Family Medicine

## 2014-01-13 ENCOUNTER — Other Ambulatory Visit: Payer: Self-pay | Admitting: Family Medicine

## 2014-01-13 ENCOUNTER — Ambulatory Visit (INDEPENDENT_AMBULATORY_CARE_PROVIDER_SITE_OTHER): Payer: BLUE CROSS/BLUE SHIELD | Admitting: Family Medicine

## 2014-01-13 ENCOUNTER — Encounter (INDEPENDENT_AMBULATORY_CARE_PROVIDER_SITE_OTHER): Payer: Self-pay

## 2014-01-13 VITALS — BP 110/74 | Ht 63.0 in | Wt 128.0 lb

## 2014-01-13 DIAGNOSIS — G4700 Insomnia, unspecified: Secondary | ICD-10-CM

## 2014-01-13 DIAGNOSIS — F32A Depression, unspecified: Secondary | ICD-10-CM

## 2014-01-13 DIAGNOSIS — K219 Gastro-esophageal reflux disease without esophagitis: Secondary | ICD-10-CM

## 2014-01-13 DIAGNOSIS — F329 Major depressive disorder, single episode, unspecified: Secondary | ICD-10-CM

## 2014-01-13 MED ORDER — OMEPRAZOLE 40 MG PO CPDR: 40.0000 mg | DELAYED_RELEASE_CAPSULE | Freq: Every day | ORAL | Status: DC

## 2014-01-13 MED ORDER — BUPROPION HCL ER (XL) 300 MG PO TB24: 300.0000 mg | ORAL_TABLET | Freq: Every day | ORAL | Status: DC

## 2014-01-13 MED ORDER — TEMAZEPAM 30 MG PO CAPS: 30.0000 mg | ORAL_CAPSULE | Freq: Every day | ORAL | Status: DC

## 2014-01-13 NOTE — Progress Notes (Signed)
   Subjective:    Patient ID: Angel Silva, female    DOB: 07/24/1965, 49 y.o.   MRN: [id number]  HPIMed check up.  Needs refill on wellbutrin, temazepam, and omeprazole.    Full time work, with o t   Heartburn and reflux stable, no bad abd pains . History of recurrent pancreatitis. No symptoms reminiscent of this lately.  Insomnia ongoing. Definitely needs medication for this. Without the medicine cannot sleep.  Depression anxiety ongoing. Overall stable. Frustrated by her her sons challenges in school.  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   Not so hot on the exercise side of things  11 am to 1 pm work schedule    Review of Systems No headache no chest pain no back pain no abdominal pain no change in bowel habits    Objective:   Physical Exam Alert no acute distress. Vital stable. HEENT normal. Lungs clear heart regular in rhythm. Next   Impression 1 reflux clinically stable on meds #2 insomnia ongoing definitely needs medicine #3 depression with element of anxiety clinically stable Plan maintain all medications. Diet discussed exercise discussed. Follow-up as scheduled. 25 minutes spent most in discussion        Assessment & Plan:  See above

## 2014-03-25 ENCOUNTER — Other Ambulatory Visit: Payer: Self-pay

## 2014-03-25 DIAGNOSIS — Z1231 Encounter for screening mammogram for malignant neoplasm of breast: Secondary | ICD-10-CM

## 2014-04-27 ENCOUNTER — Ambulatory Visit
Admission: RE | Admit: 2014-04-27 | Discharge: 2014-04-27 | Disposition: A | Discharge status: Home / Self Care | Payer: BLUE CROSS/BLUE SHIELD | Source: Ambulatory Visit

## 2014-04-27 DIAGNOSIS — Z1231 Encounter for screening mammogram for malignant neoplasm of breast: Secondary | ICD-10-CM

## 2014-04-28 ENCOUNTER — Other Ambulatory Visit: Payer: Self-pay | Admitting: Nurse Practitioner

## 2014-04-28 DIAGNOSIS — R928 Other abnormal and inconclusive findings on diagnostic imaging of breast: Secondary | ICD-10-CM

## 2014-05-01 ENCOUNTER — Other Ambulatory Visit: Payer: Self-pay | Admitting: Family Medicine

## 2014-05-01 NOTE — Telephone Encounter (Signed)
Ok plus 2 ref 

## 2014-05-05 ENCOUNTER — Ambulatory Visit
Admission: RE | Admit: 2014-05-05 | Discharge: 2014-05-05 | Disposition: A | Discharge status: Home / Self Care | Payer: BLUE CROSS/BLUE SHIELD | Source: Ambulatory Visit | Attending: Nurse Practitioner | Admitting: Nurse Practitioner

## 2014-05-05 DIAGNOSIS — R928 Other abnormal and inconclusive findings on diagnostic imaging of breast: Secondary | ICD-10-CM

## 2014-05-29 ENCOUNTER — Telehealth: Payer: Self-pay | Admitting: Family Medicine

## 2014-05-29 MED ORDER — PREDNISONE 20 MG PO TABS: ORAL_TABLET | ORAL | Status: DC

## 2014-05-29 NOTE — Telephone Encounter (Signed)
Pt is requesting something be called in for poison ivy.   [facility] [place] [place]

## 2014-05-29 NOTE — Telephone Encounter (Signed)
Left message on voicemail notifying patient that medication was sent to pharmacy.  

## 2014-05-29 NOTE — Telephone Encounter (Signed)
Adult pred taper 

## 2014-06-02 ENCOUNTER — Encounter: Payer: Self-pay | Admitting: Certified Nurse Midwife

## 2014-06-02 ENCOUNTER — Ambulatory Visit (INDEPENDENT_AMBULATORY_CARE_PROVIDER_SITE_OTHER): Payer: BLUE CROSS/BLUE SHIELD | Admitting: Certified Nurse Midwife

## 2014-06-02 ENCOUNTER — Ambulatory Visit: Payer: BC Managed Care – PPO | Admitting: Gynecology

## 2014-06-02 VITALS — BP 108/70 | HR 70 | Resp 16 | Ht 62.75 in | Wt 130.0 lb

## 2014-06-02 DIAGNOSIS — Z0000 Encounter for general adult medical examination without abnormal findings: Secondary | ICD-10-CM

## 2014-06-02 DIAGNOSIS — Z01419 Encounter for gynecological examination (general) (routine) without abnormal findings: Secondary | ICD-10-CM

## 2014-06-02 DIAGNOSIS — R32 Unspecified urinary incontinence: Secondary | ICD-10-CM | POA: Diagnosis not present

## 2014-06-02 DIAGNOSIS — N6452 Nipple discharge: Secondary | ICD-10-CM

## 2014-06-02 LAB — POCT URINALYSIS DIPSTICK
Bilirubin, UA: NEGATIVE
GLUCOSE UA: NEGATIVE
Ketones, UA: NEGATIVE
Leukocytes, UA: NEGATIVE
Nitrite, UA: NEGATIVE
PH UA: 5
Protein, UA: NEGATIVE
RBC UA: NEGATIVE
UROBILINOGEN UA: NEGATIVE

## 2014-06-02 LAB — LIPID PANEL
Cholesterol: 152 mg/dL (ref 0–200)
HDL: 54 mg/dL (ref >=46)
LDL CALC: 60 mg/dL (ref 0–99)
Total CHOL/HDL Ratio: 2.8 Ratio
Triglycerides: 188 mg/dL — ABNORMAL HIGH (ref <150)
VLDL: 38 mg/dL (ref 0–40)

## 2014-06-02 NOTE — Progress Notes (Signed)
Reviewed personally.  M. Suzanne Terry Abila, MD.  

## 2014-06-02 NOTE — Patient Instructions (Signed)

## 2014-06-02 NOTE — Progress Notes (Addendum)
49 y.o. [id number] Divorced  Caucasian Fe here for annual exam. Denies any vaginal bleeding. ? Vaginal dryness, feels irritation sometimes. Being treated for Poison Ivy with PCP at present. Sees PCP prn Dr.Luking, no routine labs, medication management for anxiety. Stable medications. Patient has noted some spontaneous urinary leakage on occasion, not with coughing or exercise. Drinks very little water, mainly other beverages with caffeine. Denies urinary frequency, urgency or dysuria or back pain. No thong use. Patient also noted some flakes around nipple area ? Nipple discharge. She has seen nothing in her bra and none on SBE. No other concerns today.  Patient's last menstrual period was 01/03/2000.          Sexually active: No.  The current method of family planning is status post hysterectomy.    Exercising: Yes.    cardio,pilates & yoga Smoker:  no  Health Maintenance: Pap:  2012 neg  TAH MMG:  04-27-14 f/u rt breast category c density,birads 3: prob benign Colonoscopy:  none BMD:   none TDaP:  2014 Labs: Poct urine-neg, Hgb-14.4 Self breast exam: not done  reports that she has quit smoking. She does not have any smokeless tobacco history on file. She reports that she does not drink alcohol or use illicit drugs.  Past Medical History  Diagnosis Date  . IBS (irritable bowel syndrome)   . Reflux   . Chronic joint pain   . Depression   . Anxiety   . GDM (gestational diabetes mellitus)     History    Past Surgical History  Procedure Laterality Date  . Tonsillectomy    . Esophagogastroduodenoscopy    . Cesarean section  1987  . Back surgery  2005    L5-S1 Fusion  . Mastopexy  2012  . Tubal ligation  1993    BTSP  . Laparoscopic cholecystectomy  1995  . Abdominal hysterectomy  2002    TAH- Fibroids , endometriosis; ov retained    Current Outpatient Prescriptions  Medication Sig Dispense Refill  . Acetaminophen (TYLENOL PO) Take by mouth as needed.    Marland Kitchen. buPROPion (WELLBUTRIN  XL) 300 MG 24 hr tablet Take 1 tablet (300 mg total) by mouth daily. 90 tablet 1  . cetirizine (ZYRTEC ALLERGY) 10 MG tablet Take 1 tablet (10 mg total) by mouth daily. 30 tablet 5  . clonazePAM (KLONOPIN) 1 MG tablet TAKE 1 TABLET EVERY 6 HOURS AS NEEDED 24 tablet 2  . cycloSPORINE (RESTASIS) 0.05 % ophthalmic emulsion Place 1 drop into both eyes 2 (two) times daily.    . Flaxseed, Linseed, (FLAX SEEDS PO) Take by mouth.    . Multiple Vitamin (MULTIVITAMIN) tablet Take 1 tablet by mouth daily.    Marland Kitchen. omeprazole (PRILOSEC) 40 MG capsule Take 1 capsule (40 mg total) by mouth daily. 90 capsule 1  . predniSONE (DELTASONE) 20 MG tablet Take 3 tabs qd x 3 days, 2 tabs qd x 3 days then 1 tab qd x 2 days 17 tablet 0  . temazepam (RESTORIL) 30 MG capsule Take 1 capsule (30 mg total) by mouth at bedtime. 30 capsule 5   No current facility-administered medications for this visit.    Family History  Problem Relation Age of Onset  . Hypertension Mother   . Diabetes Mother     Type 2  . Thyroid disease Mother   . Hypertension Father   . Atrial fibrillation Father   . Cancer Father     bladder & liver  . Heart disease Father   .  Crohn's disease Father   . Heart attack Maternal Grandfather   . CAD Maternal Uncle   . Hypertension Brother   . Diabetes Maternal Grandmother     Type 2  . Hypertension Maternal Grandmother   . Heart disease Maternal Grandmother   . Heart disease Paternal Grandmother     ROS:  Pertinent items are noted in HPI.  Otherwise, a comprehensive ROS was negative.  Exam:   LMP 01/03/2000   Ht Readings from Last 3 Encounters:  01/13/14  (1.6 m)  01/09/14 5' 2.25" (1.581 m)  07/09/13 5' 2.25" (1.581 m)    General appearance: alert, cooperative and appears stated age Head: Normocephalic, without obvious abnormality, atraumatic Neck: no adenopathy, supple, symmetrical, trachea midline and thyroid normal to inspection and palpation and non-palpable Lungs: clear to  auscultation bilaterally Breasts: normal appearance, no masses or tenderness, No nipple retraction or dimpling, No nipple discharge or bleeding, No axillary or supraclavicular adenopathy scarring from breast "lift" per patient Heart: regular rate and rhythm Abdomen: soft, non-tender; no masses,  no organomegaly, negative suprappubic Extremities: extremities normal, atraumatic, no cyanosis or edema, papular rash noted on left lower forearm Skin: Skin color, texture, turgor normal. No  lesions Lymph nodes: Cervical, supraclavicular, and axillary nodes normal. No abnormal inguinal nodes palpated Neurologic: Grossly normal   Pelvic: External genitalia:  no lesions, normal female              Urethra:  normal appearing urethra with no masses, tenderness or lesions, bladder and urethral meatus not tender              Bartholin's and Skene's: normal                 Vagina: normal appearing vagina with normal color and discharge, no lesions, moisture noted, good support, no cystocele              Cervix: absent              Pap taken: No. Bimanual Exam:  Uterus:  uterus absent              Adnexa: normal adnexa and no mass, fullness, tenderness               Rectovaginal: Confirms               Anus:  normal sphincter tone, no lesions  Chaperone present: Yes  A:  Well Woman with normal exam  Perimenopausal S/P TAH ovaries retained for endometriosis, fibroid  Spontaneous sporadic urinary incontinence, no cyctocele ? Excessive caffeine use  ? nipple discharge with flakes noted on area, no actual discharge seen. Recent diagnostic mammogram with [place] all negative  Poison ivy under treatment with PCP  Anxiety with PCP management  Screening labs, not done at PCP  P:   Reviewed health and wellness pertinent to exam  Aware if vaginal bleeding to advise  Discussed caffeine on bladder if in excess, patient will monitor and increase water intake. Will advise if increases.   Lab: Urine culture  Patient  had breast lift with nipple placement change, discussed SBE and if sees nipple discharge or change needs to report.  [id number]  Continue follow up with PCP as indcated  Labs: Lipid panel, Vitamin D  Pap smear not taken  counseled on mammography screening and need for 3-D due to Class C density, STD prevention, adequate intake of calcium and vitamin D, diet and exercise  return annually or prn  An  After Visit Summary was printed and given to the patient.

## 2014-06-03 LAB — HEMOGLOBIN, FINGERSTICK: Hemoglobin, fingerstick: 14.4 g/dL (ref 12.0–16.0)

## 2014-06-03 LAB — PROLACTIN: PROLACTIN: 9.8 ng/mL

## 2014-06-03 LAB — VITAMIN D 25 HYDROXY (VIT D DEFICIENCY, FRACTURES): VIT D 25 HYDROXY: 26 ng/mL — AB (ref 30–100)

## 2014-06-03 NOTE — Addendum Note (Signed)
Addended by: Verner CholLEONARD, Tashi Band S on: 06/03/2014 08:04 AM   Modules accepted: Kipp BroodSmartSet

## 2014-06-04 LAB — URINE CULTURE
Colony Count: NO GROWTH
ORGANISM ID, BACTERIA: NO GROWTH

## 2014-07-03 ENCOUNTER — Other Ambulatory Visit: Payer: Self-pay | Admitting: Family Medicine

## 2014-07-03 NOTE — Telephone Encounter (Signed)
Last seen 12-03-14

## 2014-07-03 NOTE — Telephone Encounter (Signed)
Ok plus five monthly ref if time

## 2014-07-14 ENCOUNTER — Ambulatory Visit: Payer: BLUE CROSS/BLUE SHIELD | Admitting: Family Medicine

## 2014-07-23 ENCOUNTER — Ambulatory Visit (INDEPENDENT_AMBULATORY_CARE_PROVIDER_SITE_OTHER): Payer: BLUE CROSS/BLUE SHIELD | Admitting: Certified Nurse Midwife

## 2014-07-23 ENCOUNTER — Encounter: Payer: Self-pay | Admitting: Certified Nurse Midwife

## 2014-07-23 ENCOUNTER — Telehealth: Payer: Self-pay | Admitting: Certified Nurse Midwife

## 2014-07-23 VITALS — BP 110/80 | HR 70 | Resp 16 | Ht 62.75 in | Wt 132.0 lb

## 2014-07-23 DIAGNOSIS — N6310 Unspecified lump in the right breast, unspecified quadrant: Secondary | ICD-10-CM

## 2014-07-23 DIAGNOSIS — N63 Unspecified lump in breast: Secondary | ICD-10-CM

## 2014-07-23 NOTE — Telephone Encounter (Signed)
Spoke with patient. Patient states that she began to have pain in her right breast last night. Noticed a "large lump" in the right breast close to the nipple. This morning feels lump in close to and behind the nipple. When she presses on the lump pain radiates through the nipple. Breast is slightly swollen without any redness. Advised will need to be seen in office with Verner Chol CNM for a breast check. Patient is agreeable. Appointment scheduled for today at 11am with Verner Chol CNM. Agreeable to date and time.  Routing to provider for final review. Patient agreeable to disposition. Will close encounter.   Patient aware provider will review message and nurse will return call if any additional advice or change of disposition.

## 2014-07-23 NOTE — Telephone Encounter (Signed)
Patient has a "rather large painful lump in right breast". Patient would like an appointment. Last seen 06/02/14.

## 2014-07-23 NOTE — Progress Notes (Signed)
Scheduled patient while in office for right breast diagnostic mammogram with right breast ultrasound for 7/22 at 9:20am at [facility]. Patient is agreeable to date and time.

## 2014-07-23 NOTE — Progress Notes (Signed)
Reviewed personally.  M. Suzanne Blanch Stang, MD.  

## 2014-07-23 NOTE — Progress Notes (Signed)
   Subjective:   49 y.o. Divorced [name] presents for evaluation of right breast mass. Onset of the symptoms was 24 hours ago.. Patient sought evaluation because of breast lump and breast tenderness. No nipple discharge noted. Contributing factors include none  Denies none. Patient denies history of trauma, bites, or injuries. Last mammogram was 05/05/14 which was normal on same breast, a follow up from ? On routine mammogram.  Previous evaluation has included mammogram (05/05/14) and ultrasound (05/15/14)   Review of Systems Pertinent items are noted in HPI.   Objective:   General appearance: alert, cooperative and no distress Breasts: normal appearance, no masses or tenderness, No nipple retraction or dimpling, No nipple discharge or bleeding, No axillary or supraclavicular adenopathy, positive findings: right breast mass at 9 o'clock, tender,, mobile cystic feel. 2-3 cm  Assessment:   ASSESSMENT:Patient is diagnosed with right breast mass possible cyst.   Plan:   PLAN: The patient has a documented plan to follow with further care of diagnostic 3D mammogram with [place] if needed.  Patient will be scheduled prior to leaving.

## 2014-07-24 ENCOUNTER — Ambulatory Visit
Admission: RE | Admit: 2014-07-24 | Discharge: 2014-07-24 | Disposition: A | Discharge status: Home / Self Care | Payer: BLUE CROSS/BLUE SHIELD | Source: Ambulatory Visit | Attending: Certified Nurse Midwife | Admitting: Certified Nurse Midwife

## 2014-07-24 DIAGNOSIS — N6310 Unspecified lump in the right breast, unspecified quadrant: Secondary | ICD-10-CM

## 2014-07-30 ENCOUNTER — Ambulatory Visit (INDEPENDENT_AMBULATORY_CARE_PROVIDER_SITE_OTHER): Payer: BLUE CROSS/BLUE SHIELD | Admitting: Family Medicine

## 2014-07-30 ENCOUNTER — Encounter: Payer: Self-pay | Admitting: Family Medicine

## 2014-07-30 VITALS — BP 122/70 | Ht 63.0 in | Wt 129.4 lb

## 2014-07-30 DIAGNOSIS — K219 Gastro-esophageal reflux disease without esophagitis: Secondary | ICD-10-CM | POA: Diagnosis not present

## 2014-07-30 DIAGNOSIS — M25512 Pain in left shoulder: Secondary | ICD-10-CM

## 2014-07-30 DIAGNOSIS — G4700 Insomnia, unspecified: Secondary | ICD-10-CM | POA: Diagnosis not present

## 2014-07-30 DIAGNOSIS — F32A Depression, unspecified: Secondary | ICD-10-CM

## 2014-07-30 DIAGNOSIS — G8929 Other chronic pain: Secondary | ICD-10-CM

## 2014-07-30 DIAGNOSIS — F329 Major depressive disorder, single episode, unspecified: Secondary | ICD-10-CM | POA: Diagnosis not present

## 2014-07-30 MED ORDER — CLONAZEPAM 1 MG PO TABS: 1.0000 mg | ORAL_TABLET | Freq: Four times a day (QID) | ORAL | Status: DC | PRN

## 2014-07-30 MED ORDER — BUPROPION HCL ER (XL) 300 MG PO TB24: 300.0000 mg | ORAL_TABLET | Freq: Every day | ORAL | Status: DC

## 2014-07-30 MED ORDER — TEMAZEPAM 30 MG PO CAPS: 30.0000 mg | ORAL_CAPSULE | Freq: Every day | ORAL | Status: DC

## 2014-07-30 NOTE — Progress Notes (Signed)
   Subjective:    Patient ID: Wynona Neat, female    DOB: Jun 09, 1965, 49 y.o.   MRN: [id number]  HPI  Patient arrives to follow up on depression and anxiety.   Patient reports no problems or concerns.  Stopped taking omeprazole,,  relux overall is better  Sleeping ok, but ndoes nt feel rested  Gets around seven hrs, but does not feel rested   Was exercising regularly   Reports ongoing challenges getting to sleep. Uses Klonopin for this as needed.  Reports depression overall stable. No suicidal or homicidal thoughts.  Ongoing challenges with rosacea. Patient uses metronidazole gel. Definitely helps.  Review of Systems No headache some chronic back pain no chest pain no abdominal pain some diminished energy.    Objective:   Physical Exam Alert vital stable. HEENT facial rosacea good control next supple. Lungs clear. Heart rare rhythm. Abdomen benign. Ankles without edema.       Assessment & Plan:  Impression 1 rosacea good control #2 insomnia ongoing #3 chronic anxiety depression clinically stable on medications #4 reflux stable on just when necessary use of over-the-counter meds plan diet exercise discussed all medication refill multiple questions answered 25 minutes spent most in discussion recheck every 6 months WSL

## 2014-09-04 ENCOUNTER — Other Ambulatory Visit: Payer: Self-pay | Admitting: Family Medicine

## 2014-09-08 NOTE — Telephone Encounter (Signed)
Puzzling cause i thought we wrote ref in July --see note. Ok plus four monthly ref

## 2014-09-30 ENCOUNTER — Other Ambulatory Visit: Payer: Self-pay | Admitting: Family Medicine

## 2014-09-30 NOTE — Telephone Encounter (Signed)
Last seen 07/30/14

## 2014-09-30 NOTE — Telephone Encounter (Signed)
Ok plus two monthly ref 

## 2014-11-09 ENCOUNTER — Other Ambulatory Visit: Payer: Self-pay | Admitting: Certified Nurse Midwife

## 2014-11-09 DIAGNOSIS — R928 Other abnormal and inconclusive findings on diagnostic imaging of breast: Secondary | ICD-10-CM

## 2014-11-27 ENCOUNTER — Other Ambulatory Visit: Payer: Self-pay | Admitting: *Deleted

## 2014-11-27 MED ORDER — BUPROPION HCL ER (XL) 300 MG PO TB24: 300.0000 mg | ORAL_TABLET | Freq: Every day | ORAL | Status: DC

## 2014-11-30 ENCOUNTER — Telehealth: Payer: Self-pay | Admitting: Family Medicine

## 2014-11-30 ENCOUNTER — Other Ambulatory Visit: Payer: BLUE CROSS/BLUE SHIELD

## 2014-11-30 MED ORDER — CLONAZEPAM 1 MG PO TABS: 1.0000 mg | ORAL_TABLET | Freq: Three times a day (TID) | ORAL | Status: DC | PRN

## 2014-11-30 MED ORDER — TEMAZEPAM 30 MG PO CAPS: 30.0000 mg | ORAL_CAPSULE | Freq: Every day | ORAL | Status: DC

## 2014-11-30 NOTE — Telephone Encounter (Signed)
Patient needs Rx for temazepam (RESTORIL) 30 MG capsule and clonazePAM (KLONOPIN) 1 MG tablet 90 day supply to [facility].  Please call when complete.

## 2014-11-30 NOTE — Telephone Encounter (Signed)
Patient was notified meds will be faxed to pharmacy. Klonopin was written for #24 on 09/30/14. What amount do you want me to write script for since you approved a 90 day supply?

## 2014-11-30 NOTE — Telephone Encounter (Signed)
Scripts were faxed.

## 2014-11-30 NOTE — Telephone Encounter (Signed)
Last seen 07/30/14

## 2014-11-30 NOTE — Telephone Encounter (Signed)
This patient needs office visit scheduled up in December with Dr. Brett CanalesSteve or with [name]. Once the patient understands that she needs to schedule the office visit and gives us her word that she will keep the office visit we will be happy to go ahead and send in 90 day prescription

## 2014-11-30 NOTE — Telephone Encounter (Signed)
I would not be comfortable prescribing more than 60 of these at a time one 3 times a day when necessary with one refill. She will need to discuss this with Dr. Brett CanalesSteve when she follows up for her office visit typically controlled medications we do not do large amounts at one time

## 2014-12-08 ENCOUNTER — Ambulatory Visit
Admission: RE | Admit: 2014-12-08 | Discharge: 2014-12-08 | Disposition: A | Discharge status: Home / Self Care | Payer: BLUE CROSS/BLUE SHIELD | Source: Ambulatory Visit | Attending: Certified Nurse Midwife | Admitting: Certified Nurse Midwife

## 2014-12-08 DIAGNOSIS — R928 Other abnormal and inconclusive findings on diagnostic imaging of breast: Secondary | ICD-10-CM

## 2014-12-09 ENCOUNTER — Telehealth: Payer: Self-pay | Admitting: Emergency Medicine

## 2014-12-09 NOTE — Telephone Encounter (Signed)
-----   Message from Verner Choleborah S Leonard, CNM sent at 12/09/2014  8:11 AM EST ----- Diagnostic mammogram and US of right breast shows resolution of cyst palpated at OV and fibrocystic changes. Birads negative 1, Density c needs 3 D yearly Out of MM hold if MSM agrees Repeat mammogram one year

## 2014-12-29 ENCOUNTER — Encounter: Payer: Self-pay | Admitting: Family Medicine

## 2014-12-29 ENCOUNTER — Ambulatory Visit (INDEPENDENT_AMBULATORY_CARE_PROVIDER_SITE_OTHER): Payer: BLUE CROSS/BLUE SHIELD | Admitting: Family Medicine

## 2014-12-29 VITALS — BP 112/84 | Ht 63.0 in | Wt 137.1 lb

## 2014-12-29 DIAGNOSIS — F411 Generalized anxiety disorder: Secondary | ICD-10-CM | POA: Insufficient documentation

## 2014-12-29 DIAGNOSIS — G4700 Insomnia, unspecified: Secondary | ICD-10-CM | POA: Diagnosis not present

## 2014-12-29 DIAGNOSIS — F32A Depression, unspecified: Secondary | ICD-10-CM

## 2014-12-29 DIAGNOSIS — M722 Plantar fascial fibromatosis: Secondary | ICD-10-CM

## 2014-12-29 DIAGNOSIS — F329 Major depressive disorder, single episode, unspecified: Secondary | ICD-10-CM | POA: Diagnosis not present

## 2014-12-29 MED ORDER — BUPROPION HCL ER (XL) 300 MG PO TB24: 300.0000 mg | ORAL_TABLET | Freq: Every day | ORAL | Status: DC

## 2014-12-29 MED ORDER — NABUMETONE 750 MG PO TABS: 750.0000 mg | ORAL_TABLET | Freq: Two times a day (BID) | ORAL | Status: DC

## 2014-12-29 MED ORDER — TEMAZEPAM 30 MG PO CAPS: 30.0000 mg | ORAL_CAPSULE | Freq: Every day | ORAL | Status: DC

## 2014-12-29 MED ORDER — CLONAZEPAM 1 MG PO TABS: 1.0000 mg | ORAL_TABLET | Freq: Every day | ORAL | Status: DC | PRN

## 2014-12-29 NOTE — Progress Notes (Signed)
   Subjective:    Patient ID: Angel Silva, female    DOB: 01/12/1965, 49 y.o.   MRN: [id number]  patient arrives to the office with multiple concerns. HPI Patient is here today for a follow up visit on depression. Patient is doing very well. Patient has no concerns at this time.  Has periods of feeling down but overall the antidepressant is working well. Meds reviewed today. No obvious side effects.  Ongoing challenges with insomnia. Needs Restoril and or to get a good night sleep. Medication review today. Compliant no obvious side effects.   takes an average of one nerve pill per day. On further history is been doing this while. States she definitely needs it. Usually takes in the evening slight sedating impact. meds reviewed today  Son lives with pt now,  Plantar fascitis, had injections of fascia, helped a little. But not a lot   Patient wants Dr. Brett CanalesSteve to take over prescribing her nabumetone so she doesn't have to see the podiatrist just to have this med prescribed.   Review of Systems  no headache no chest pain no back pain no abdominal pain no change in bowel habits    Objective:   Physical Exam   alert vitals stable. HEENT normal. Lungs clear. Heart regular in rhythm. Neuro exam intact.  left foot lateral foot tender to palpation pulses good no edema     Assessment & Plan:   impression 1 depression stable on meds meds reviewed continue same #2 insomnia stable on meds meds reviewed continued same #3 anxiety stable on anxiolytic need for giving a new prescription each month reviewed with multiple questions maintain same number for plantar fasciitis. Relafen refilled local measures discussed plan all meds refills no diet exercise discussed in encourage recheck every 6 months WSL

## 2015-01-15 NOTE — Telephone Encounter (Signed)
Out of current recall per Dr. Hyacinth MeekerMiller.

## 2015-02-01 ENCOUNTER — Ambulatory Visit: Payer: Self-pay | Admitting: Family Medicine

## 2015-03-05 ENCOUNTER — Telehealth: Payer: Self-pay

## 2015-03-23 ENCOUNTER — Other Ambulatory Visit: Payer: Self-pay | Admitting: Certified Nurse Midwife

## 2015-03-23 DIAGNOSIS — Z1231 Encounter for screening mammogram for malignant neoplasm of breast: Secondary | ICD-10-CM

## 2015-04-29 ENCOUNTER — Ambulatory Visit (HOSPITAL_COMMUNITY): Payer: BLUE CROSS/BLUE SHIELD

## 2015-04-29 ENCOUNTER — Ambulatory Visit (HOSPITAL_COMMUNITY)
Admission: RE | Admit: 2015-04-29 | Discharge: 2015-04-29 | Disposition: A | Discharge status: Home / Self Care | Payer: BLUE CROSS/BLUE SHIELD | Source: Ambulatory Visit | Attending: Certified Nurse Midwife | Admitting: Certified Nurse Midwife

## 2015-04-29 DIAGNOSIS — Z1231 Encounter for screening mammogram for malignant neoplasm of breast: Secondary | ICD-10-CM | POA: Diagnosis present

## 2015-06-10 ENCOUNTER — Ambulatory Visit: Payer: BLUE CROSS/BLUE SHIELD | Admitting: Certified Nurse Midwife

## 2015-06-10 ENCOUNTER — Other Ambulatory Visit: Payer: Self-pay | Admitting: Family Medicine

## 2015-06-10 NOTE — Telephone Encounter (Signed)
Ok plus one ref 

## 2015-06-30 ENCOUNTER — Ambulatory Visit (INDEPENDENT_AMBULATORY_CARE_PROVIDER_SITE_OTHER): Payer: Managed Care, Other (non HMO) | Admitting: Family Medicine

## 2015-06-30 VITALS — BP 120/76 | Ht 63.0 in | Wt 133.8 lb

## 2015-06-30 DIAGNOSIS — M6530 Trigger finger, unspecified finger: Secondary | ICD-10-CM | POA: Diagnosis not present

## 2015-06-30 DIAGNOSIS — G4700 Insomnia, unspecified: Secondary | ICD-10-CM | POA: Diagnosis not present

## 2015-06-30 DIAGNOSIS — F329 Major depressive disorder, single episode, unspecified: Secondary | ICD-10-CM | POA: Diagnosis not present

## 2015-06-30 DIAGNOSIS — F32A Depression, unspecified: Secondary | ICD-10-CM

## 2015-06-30 MED ORDER — BUPROPION HCL ER (XL) 300 MG PO TB24: 300.0000 mg | ORAL_TABLET | Freq: Every day | ORAL | Status: DC

## 2015-06-30 NOTE — Progress Notes (Signed)
   Subjective:    Patient ID: Angel Silva, female    DOB: 08/13/1965, 50 y.o.   MRN: [id number]  HPI  Patient arrives for follow up on depression. Depression overall under good control with current medications. No obvious side effects. Takes faithfully does not miss a dose  Patient also with c/o stiffness in right hand for 6-8 weeks.hand disc for the past six weeks,  Flex and extend clicking of the right thumb, last wk hand was actually swollen, also sticfe, with job uses hands a fair amnthad to use right hand fair amnt, but htumb haw been going on for about six sven weeks   meds for depr and anxiety for the most part ok, if rushed and forgets has incr troubles  Uses rest qhs and the clonopin ea evening . States with these medicines definitely has improved sleeping. Definitely needs to stay with them.    Pt states so so on hth eexercise, btter than before Walking some but not a lot     Review of Systems No headache, no major weight loss or weight gain, no chest pain no back pain abdominal pain no change in bowel habits complete ROS otherwise negative     Objective:   Physical Exam Alert vitals stable. HEENT normal. Lungs clear. Heart and rhythm. Right hand tender at the metacarpophalangeal joint positive clicking with flexion and extension.      Assessment & Plan:  Impression insomnia multiple meds discussed patient definitely needs #2 depression clinically stable meds reviewed to maintain same #3 and pain with trigger finger discussed to use anti-inflammatory medicines previously prescribed for plantar fascitis plan all medications refilled diet exercise discussed follow-up every 6 months WSL

## 2015-07-07 ENCOUNTER — Telehealth: Payer: Self-pay | Admitting: Family Medicine

## 2015-07-07 MED ORDER — NAPROXEN 500 MG PO TABS: 500.0000 mg | ORAL_TABLET | Freq: Two times a day (BID) | ORAL | Status: DC | PRN

## 2015-07-07 NOTE — Telephone Encounter (Signed)
Prescription sent electronically to pharmacy. Patient notified. 

## 2015-07-07 NOTE — Telephone Encounter (Signed)
Naprosyn 500 bid 28 one bid with food prn

## 2015-07-07 NOTE — Telephone Encounter (Signed)
Pt called stating that she was seen last week for her hand and was told to try the anti-inflammatory med that she had at home. Pt states that she forgot that it makes her sick to her stomach. Pt would like for something else to be called in. Pt states that it helped but made her sick on her stomach. Please advise.     [facility]

## 2015-07-07 NOTE — Telephone Encounter (Signed)
Patient states the relafen 750 mg one BID is making her nauseated

## 2015-07-09 ENCOUNTER — Other Ambulatory Visit: Payer: Self-pay | Admitting: Family Medicine

## 2015-07-12 NOTE — Telephone Encounter (Signed)
osx six mo worth

## 2015-07-19 ENCOUNTER — Telehealth: Payer: Self-pay | Admitting: Family Medicine

## 2015-07-19 DIAGNOSIS — M79646 Pain in unspecified finger(s): Secondary | ICD-10-CM

## 2015-07-19 MED ORDER — NAPROXEN 500 MG PO TABS: 500.0000 mg | ORAL_TABLET | Freq: Two times a day (BID) | ORAL | Status: DC | PRN

## 2015-07-19 NOTE — Telephone Encounter (Signed)
Pt called stating that the medication has helped but it is not completely helping like she would like. Pt wants to know if the dr wants to call in a refill and/or send her to an orthopedic? Please advise.

## 2015-07-19 NOTE — Telephone Encounter (Signed)
[facility] (med sent in) 

## 2015-07-19 NOTE — Telephone Encounter (Signed)
She may have a refill, I would recommend consultation with orthopedics either [facility] or [facility] or Dr. Romeo AppleHarrison here in town

## 2015-07-19 NOTE — Telephone Encounter (Signed)
Pt has 2 or 3 days worth of medication left.

## 2015-07-19 NOTE — Telephone Encounter (Signed)
Patient states it is the naproxen.

## 2015-07-20 ENCOUNTER — Ambulatory Visit: Payer: BLUE CROSS/BLUE SHIELD | Admitting: Certified Nurse Midwife

## 2015-07-20 NOTE — Addendum Note (Signed)
Addended by: Dereck LigasJOHNSON, Manuel Dall P on: 07/20/2015 08:33 AM   Modules accepted: Orders

## 2015-07-23 ENCOUNTER — Telehealth: Payer: Self-pay | Admitting: Family Medicine

## 2015-07-23 ENCOUNTER — Encounter: Payer: Self-pay | Admitting: Family Medicine

## 2015-07-23 NOTE — Telephone Encounter (Signed)
Pt DOES NOT want to see Dr. Romeo AppleHarrison and is requesting the referral to be sent to someone in [place].

## 2015-07-23 NOTE — Telephone Encounter (Signed)
This was sent to Dr. Mort SawyersHarrison's office on mistake.  [facility]

## 2015-07-23 NOTE — Telephone Encounter (Signed)
In the notes of the [place] [facility] was [place] [facility]

## 2015-08-01 ENCOUNTER — Other Ambulatory Visit: Payer: Self-pay | Admitting: Family Medicine

## 2015-08-06 ENCOUNTER — Encounter: Payer: Self-pay | Admitting: Certified Nurse Midwife

## 2015-08-06 ENCOUNTER — Ambulatory Visit (INDEPENDENT_AMBULATORY_CARE_PROVIDER_SITE_OTHER): Payer: Managed Care, Other (non HMO) | Admitting: Certified Nurse Midwife

## 2015-08-06 VITALS — BP 110/72 | HR 70 | Resp 16 | Ht 62.25 in | Wt 132.0 lb

## 2015-08-06 DIAGNOSIS — Z01419 Encounter for gynecological examination (general) (routine) without abnormal findings: Secondary | ICD-10-CM

## 2015-08-06 NOTE — Progress Notes (Signed)
Encounter reviewed Jill Jertson, MD   

## 2015-08-06 NOTE — Patient Instructions (Signed)
EXERCISE AND DIET:  We recommended that you start or continue a regular exercise program for good health. Regular exercise means any activity that makes your heart beat faster and makes you sweat.  We recommend exercising at least 30 minutes per day at least 3 days a week, preferably 4 or 5.  We also recommend a diet low in fat and sugar.  Inactivity, poor dietary choices and obesity can cause diabetes, heart attack, stroke, and kidney damage, among others.    ALCOHOL AND SMOKING:  Women should limit their alcohol intake to no more than 7 drinks/beers/glasses of wine (combined, not each!) per week. Moderation of alcohol intake to this level decreases your risk of breast cancer and liver damage. And of course, no recreational drugs are part of a healthy lifestyle.  And absolutely no smoking or even second hand smoke. Most people know smoking can cause heart and lung diseases, but did you know it also contributes to weakening of your bones? Aging of your skin?  Yellowing of your teeth and nails?  CALCIUM AND VITAMIN D:  Adequate intake of calcium and Vitamin D are recommended.  The recommendations for exact amounts of these supplements seem to change often, but generally speaking 600 mg of calcium (either carbonate or citrate) and 800 units of Vitamin D per day seems prudent. Certain women may benefit from higher intake of Vitamin D.  If you are among these women, your doctor will have told you during your visit.    PAP SMEARS:  Pap smears, to check for cervical cancer or precancers,  have traditionally been done yearly, although recent scientific advances have shown that most women can have pap smears less often.  However, every woman still should have a physical exam from her gynecologist every year. It will include a breast check, inspection of the vulva and vagina to check for abnormal growths or skin changes, a visual exam of the cervix, and then an exam to evaluate the size and shape of the uterus and  ovaries.  And after 49 years of age, a rectal exam is indicated to check for rectal cancers. We will also provide age appropriate advice regarding health maintenance, like when you should have certain vaccines, screening for sexually transmitted diseases, bone density testing, colonoscopy, mammograms, etc.   MAMMOGRAMS:  All women over 40 years old should have a yearly mammogram. Many facilities now offer a "3D" mammogram, which may cost around $50 extra out of pocket. If possible,  we recommend you accept the option to have the 3D mammogram performed.  It both reduces the number of women who will be called back for extra views which then turn out to be normal, and it is better than the routine mammogram at detecting truly abnormal areas.    COLONOSCOPY:  Colonoscopy to screen for colon cancer is recommended for all women at age 50.  We know, you hate the idea of the prep.  We agree, BUT, having colon cancer and not knowing it is worse!!  Colon cancer so often starts as a polyp that can be seen and removed at colonscopy, which can quite literally save your life!  And if your first colonoscopy is normal and you have no family history of colon cancer, most women don't have to have it again for 10 years.  Once every ten years, you can do something that may end up saving your life, right?  We will be happy to help you get it scheduled when you are ready.    Be sure to check your insurance coverage so you understand how much it will cost.  It may be covered as a preventative service at no cost, but you should check your particular policy.     Perimenopause Perimenopause is the time when your body begins to move into the menopause (no menstrual period for 12 straight months). It is a natural process. Perimenopause can begin 2-8 years before the menopause and usually lasts for 1 year after the menopause. During this time, your ovaries may or may not produce an egg. The ovaries vary in their production of estrogen and  progesterone hormones each month. This can cause irregular menstrual periods, difficulty getting pregnant, vaginal bleeding between periods, and uncomfortable symptoms. CAUSES  Irregular production of the ovarian hormones, estrogen and progesterone, and not ovulating every month.  Other causes include:  Tumor of the pituitary gland in the brain.  Medical disease that affects the ovaries.  Radiation treatment.  Chemotherapy.  Unknown causes.  Heavy smoking and excessive alcohol intake can bring on perimenopause sooner. SIGNS AND SYMPTOMS   Hot flashes.  Night sweats.  Irregular menstrual periods.  Decreased sex drive.  Vaginal dryness.  Headaches.  Mood swings.  Depression.  Memory problems.  Irritability.  Tiredness.  Weight gain.  Trouble getting pregnant.  The beginning of losing bone cells (osteoporosis).  The beginning of hardening of the arteries (atherosclerosis). DIAGNOSIS  Your health care provider will make a diagnosis by analyzing your age, menstrual history, and symptoms. He or she will do a physical exam and note any changes in your body, especially your female organs. Female hormone tests may or may not be helpful depending on the amount of female hormones you produce and when you produce them. However, other hormone tests may be helpful to rule out other problems. TREATMENT  In some cases, no treatment is needed. The decision on whether treatment is necessary during the perimenopause should be made by you and your health care provider based on how the symptoms are affecting you and your lifestyle. Various treatments are available, such as:  Treating individual symptoms with a specific medicine for that symptom.  Herbal medicines that can help specific symptoms.  Counseling.  Group therapy. HOME CARE INSTRUCTIONS   Keep track of your menstrual periods (when they occur, how heavy they are, how long between periods, and how long they last) as  well as your symptoms and when they started.  Only take over-the-counter or prescription medicines as directed by your health care provider.  Sleep and rest.  Exercise.  Eat a diet that contains calcium (good for your bones) and soy (acts like the estrogen hormone).  Do not smoke.  Avoid alcoholic beverages.  Take vitamin supplements as recommended by your health care provider. Taking vitamin E may help in certain cases.  Take calcium and vitamin D supplements to help prevent bone loss.  Group therapy is sometimes helpful.  Acupuncture may help in some cases. SEEK MEDICAL CARE IF:   You have questions about any symptoms you are having.  You need a referral to a specialist (gynecologist, psychiatrist, or psychologist). SEEK IMMEDIATE MEDICAL CARE IF:   You have vaginal bleeding.  Your period lasts longer than 8 days.  Your periods are recurring sooner than 21 days.  You have bleeding after intercourse.  You have severe depression.  You have pain when you urinate.  You have severe headaches.  You have vision problems.   This information is not intended to replace advice   given to you by your health care provider. Make sure you discuss any questions you have with your health care provider.   Document Released: 01/27/2004 Document Revised: 01/09/2014 Document Reviewed: 07/18/2012 Elsevier Interactive Patient Education 2016 [employer] Inc.  

## 2015-08-06 NOTE — Progress Notes (Signed)
50 y.o. [id number] Divorced  Caucasian Fe here for annual exam. Denies vaginal bleeding or vaginal dryness. No hot flashes or night sweats as of yet. Sees PCP for aex([facility]), labs and medication management anxiety and depression. Currently having right had evaluation from work related activity. No other health issues today.  Planning trip to [place] soon.  Patient's last menstrual period was 01/03/2000.  TAH with ovaries retained         Sexually active: Yes.    The current method of family planning is status post hysterectomy.    Exercising: Yes.    yoga & pilates Smoker:  no  Health Maintenance: Pap:  2012 neg MMG: 04-29-15 category c density birads 1:neg Colonoscopy:  none BMD:   none TDaP:  2014 Shingles: no Pneumonia: no Hep C and HIV: not done Labs: none Self breast exam: not done   reports that she has quit smoking. She has never used smokeless tobacco. She reports that she does not drink alcohol or use drugs.  Past Medical History:  Diagnosis Date  . Anxiety   . Chronic joint pain   . Depression   . GDM (gestational diabetes mellitus)    History  . IBS (irritable bowel syndrome)   . Reflux     Past Surgical History:  Procedure Laterality Date  . ABDOMINAL HYSTERECTOMY  2002   TAH- Fibroids , endometriosis; ov retained  . BACK SURGERY  2005   L5-S1 Fusion  . CESAREAN SECTION  1987  . ESOPHAGOGASTRODUODENOSCOPY    . LAPAROSCOPIC CHOLECYSTECTOMY  1995  . MASTOPEXY  2012  . TONSILLECTOMY    . TUBAL LIGATION  1993   BTSP    Current Outpatient Prescriptions  Medication Sig Dispense Refill  . Acetaminophen (TYLENOL PO) Take by mouth as needed.    Marland Kitchen buPROPion (WELLBUTRIN XL) 300 MG 24 hr tablet TAKE 1 TABLET DAILY 90 tablet 1  . cetirizine (ZYRTEC ALLERGY) 10 MG tablet Take 1 tablet (10 mg total) by mouth daily. 30 tablet 5  . clonazePAM (KLONOPIN) 1 MG tablet TAKE 1 TABLET BY MOUTH DAILY AS NEEDED 30 tablet 5  . Multiple Vitamin (MULTIVITAMIN) tablet Take 1  tablet by mouth daily.    . naproxen (NAPROSYN) 500 MG tablet Take 1 tablet (500 mg total) by mouth 2 (two) times daily as needed. With food 28 tablet 0  . temazepam (RESTORIL) 30 MG capsule TAKE ONE CAPSULE BY MOUTH AT BEDTIME 30 capsule 1  . triamcinolone cream (KENALOG) 0.1 % as needed.  3   No current facility-administered medications for this visit.     Family History  Problem Relation Age of Onset  . Hypertension Mother   . Diabetes Mother     Type 2  . Thyroid disease Mother   . Glaucoma Mother   . Hypertension Father   . Atrial fibrillation Father   . Cancer Father     bladder & liver  . Heart disease Father   . Crohn's disease Father   . Heart attack Maternal Grandfather   . CAD Maternal Uncle   . Hypertension Brother   . Melanoma Brother   . Diabetes Maternal Grandmother     Type 2  . Hypertension Maternal Grandmother   . Heart disease Maternal Grandmother   . Heart disease Paternal Grandmother     ROS:  Pertinent items are noted in HPI.  Otherwise, a comprehensive ROS was negative.  Exam:   LMP 01/03/2000    Ht Readings from Last 3  Encounters:  06/30/15  (1.6 m)  12/29/14  (1.6 m)  07/30/14  (1.6 m)    General appearance: alert, cooperative and appears stated age Head: Normocephalic, without obvious abnormality, atraumatic Neck: no adenopathy, supple, symmetrical, trachea midline and thyroid normal to inspection and palpation Lungs: clear to auscultation bilaterally Breasts: normal appearance, no masses or tenderness, No nipple retraction or dimpling, No nipple discharge or bleeding, No axillary or supraclavicular adenopathy Heart: regular rate and rhythm Abdomen: soft, non-tender; no masses,  no organomegaly Extremities: extremities normal, atraumatic, no cyanosis or edema Skin: Skin color, texture, turgor normal. No rashes or lesions Lymph nodes: Cervical, supraclavicular, and axillary nodes normal. No abnormal inguinal nodes  palpated Neurologic: Grossly normal   Pelvic: External genitalia:  no lesions              Urethra:  normal appearing urethra with no masses, tenderness or lesions              Bartholin's and Skene's: normal                 Vagina: normal appearing vagina with normal color and discharge, no lesions              Cervix: absent              Pap taken: No. Bimanual Exam:  Uterus:  uterus absent              Adnexa: normal adnexa and no mass, fullness, tenderness               Rectovaginal: Confirms               Anus:  normal sphincter tone, no lesions  Chaperone present: yes  A:  Well Woman with normal exam  Perimenopausal S/P TAH for fibroids/endometriosis  Right breast follow up completed now on yearly mammogram,   Anxiety/depression with PCP management  Right had under evaluation due to work activity  Colonoscopy due next year  P:   Reviewed health and wellness pertinent to exam  Discussed etiology and information given. Questions addressed.  Continue MD follow up as indicated  Patient will call for referral to Dr. Loreta Ave after she turns 51  Pap smear as above not taken   counseled on breast self exam, mammography screening, adequate intake of calcium and vitamin D, diet and exercise  return annually or prn  An After Visit Summary was printed and given to the patient.

## 2015-08-19 ENCOUNTER — Other Ambulatory Visit: Payer: Self-pay | Admitting: Family Medicine

## 2015-08-19 ENCOUNTER — Telehealth: Payer: Self-pay | Admitting: Family Medicine

## 2015-08-19 MED ORDER — TEMAZEPAM 30 MG PO CAPS: 30.0000 mg | ORAL_CAPSULE | Freq: Every day | ORAL | 4 refills | Status: DC

## 2015-08-19 NOTE — Telephone Encounter (Signed)
May have this +4 refills 

## 2015-08-19 NOTE — Telephone Encounter (Signed)
Faxed and pt notified.

## 2015-08-19 NOTE — Telephone Encounter (Signed)
temazepam (RESTORIL) 30 MG capsule   Pt needs this today, she is out and the Pharmacy told her that she had no refills   [facility]

## 2015-12-30 ENCOUNTER — Ambulatory Visit: Payer: Managed Care, Other (non HMO) | Admitting: Family Medicine

## 2016-01-05 ENCOUNTER — Ambulatory Visit (INDEPENDENT_AMBULATORY_CARE_PROVIDER_SITE_OTHER): Payer: Managed Care, Other (non HMO) | Admitting: Family Medicine

## 2016-01-05 ENCOUNTER — Telehealth: Payer: Self-pay | Admitting: Family Medicine

## 2016-01-05 ENCOUNTER — Encounter: Payer: Self-pay | Admitting: Family Medicine

## 2016-01-05 ENCOUNTER — Other Ambulatory Visit: Payer: Self-pay | Admitting: Family Medicine

## 2016-01-05 VITALS — BP 128/84 | Ht 63.0 in | Wt 137.0 lb

## 2016-01-05 DIAGNOSIS — F5101 Primary insomnia: Secondary | ICD-10-CM | POA: Diagnosis not present

## 2016-01-05 DIAGNOSIS — J4520 Mild intermittent asthma, uncomplicated: Secondary | ICD-10-CM | POA: Diagnosis not present

## 2016-01-05 DIAGNOSIS — M79646 Pain in unspecified finger(s): Secondary | ICD-10-CM

## 2016-01-05 DIAGNOSIS — F321 Major depressive disorder, single episode, moderate: Secondary | ICD-10-CM

## 2016-01-05 MED ORDER — ALBUTEROL SULFATE HFA 108 (90 BASE) MCG/ACT IN AERS: 2.0000 | INHALATION_SPRAY | Freq: Four times a day (QID) | RESPIRATORY_TRACT | 0 refills | Status: DC | PRN

## 2016-01-05 MED ORDER — DICLOFENAC SODIUM 75 MG PO TBEC: 75.0000 mg | DELAYED_RELEASE_TABLET | Freq: Two times a day (BID) | ORAL | 5 refills | Status: DC

## 2016-01-05 MED ORDER — TEMAZEPAM 30 MG PO CAPS: 30.0000 mg | ORAL_CAPSULE | Freq: Every day | ORAL | 5 refills | Status: DC

## 2016-01-05 MED ORDER — BUPROPION HCL ER (XL) 300 MG PO TB24: 300.0000 mg | ORAL_TABLET | Freq: Every day | ORAL | 1 refills | Status: DC

## 2016-01-05 MED ORDER — CLONAZEPAM 1 MG PO TABS: 1.0000 mg | ORAL_TABLET | Freq: Every day | ORAL | 5 refills | Status: DC | PRN

## 2016-01-05 NOTE — Telephone Encounter (Signed)
Patient says she needs the Clonazepam for tonight.

## 2016-01-05 NOTE — Telephone Encounter (Signed)
[facility] says [facility]  has told her they do not have the Rx for temazepam and clonazepam.  Please advise.

## 2016-01-05 NOTE — Progress Notes (Signed)
   Subjective:    Patient ID: Angel Silva, female    DOB: 04/01/1965, 51 y.o.   MRN: [id number]  Anxiety  Presents for follow-up visit. Primary symptoms comment: wheezing, tightness, and shortness of breath almost daily for 2 -3 months.    wheezing and tightness int he chest Occurs off and on with any type of exertion  Actually heard wheezing a few times  No initial illness, had a stomach bug, but not respiratory  No hx of asthma as child  Was doing well on exercise, but fell off past wk, Can be a bit sob with cardio, but the wheeezing can occur any time   Has occas cough , warm air can trigger incr tendecny towards   Lasts a few minutes '  Patient notes ongoing compliance with antidepressant medication. No obvious side effects. Reports does not miss a dose. Overall continues to help depression substantially. No thoughts of homicide or suicide. Would like to maintain medication.  Patient compliant with insomnia medication. Generally takes most nights. No obvious morning drowsiness. Definitely helps patient sleep. Without it patient states would not get a good nights rest.  Patient also notes ongoing hand pain. Carpal tunnel syndrome symptoms. Saw the specialists an injection. Still needs will anti-inflammatory meds trying to put up follow-up visit she is afraid will lead to surgery. Tolerating anti-inflammatories well   Review of Systems No headache, no major weight loss or weight gain, no chest pain no back pain abdominal pain no change in bowel habits complete ROS otherwise negative     Objective:   Physical Exam Alert vitals stable, NAD. Blood pressure good on repeat. HEENT normal. Lungs clear. Heart regular rate and rhythm. Positive Tinel's sign positive Phalen's sign bilateral       Assessment & Plan:  Impression 1 reactive airways discussed potentially related to #2 #2 chronic allergic rhinitis #3 chronic hand pain with request for new meds 4 insomnia ongoing  need for meds discussed compliance discussed #5 depression anxiety clinically fair control. No suicidal thoughts meds were refilled diet exercise discussed

## 2016-01-05 NOTE — Telephone Encounter (Signed)
Called and spoke with pharmacy.  Faxed was not received earlier.  Refills re faxed.  Patient aware that refills sent over again she will check with pharmacy this afternoon.

## 2016-04-19 DIAGNOSIS — Z1283 Encounter for screening for malignant neoplasm of skin: Secondary | ICD-10-CM | POA: Diagnosis not present

## 2016-04-19 DIAGNOSIS — L718 Other rosacea: Secondary | ICD-10-CM | POA: Diagnosis not present

## 2016-05-17 ENCOUNTER — Telehealth: Payer: Self-pay | Admitting: Certified Nurse Midwife

## 2016-05-17 DIAGNOSIS — Z1211 Encounter for screening for malignant neoplasm of colon: Secondary | ICD-10-CM

## 2016-05-17 NOTE — Telephone Encounter (Signed)
Spoke with patient. Patient request referral for screening colonoscopy, discussed at last AEX on 08/06/15. Referral placed for Dr. Loreta AveMann. Advised patient our referral department will return call for scheduling. Patient request appointment before 10am for consult. Patient verbalizes understanding.  Routing to provider for final review. Patient is agreeable to disposition. Will close encounter.  Cc: Angel Feathersebecca Frahm

## 2016-05-17 NOTE — Telephone Encounter (Signed)
Patient is calling to get help with scheduling a colonoscopy.

## 2016-05-26 ENCOUNTER — Telehealth: Payer: Self-pay | Admitting: Family Medicine

## 2016-05-26 ENCOUNTER — Other Ambulatory Visit: Payer: Self-pay | Admitting: *Deleted

## 2016-05-26 MED ORDER — SULFACETAMIDE SODIUM 10 % OP SOLN: OPHTHALMIC | 0 refills | Status: DC

## 2016-05-26 MED ORDER — SULFACETAMIDE SODIUM 10 % OP SOLN
OPHTHALMIC | 0 refills | Status: DC
Start: 2016-05-26 — End: 2016-09-14

## 2016-05-26 NOTE — Telephone Encounter (Signed)
Pt is requesting something to be called in for pink eye.     [facility]

## 2016-05-26 NOTE — Telephone Encounter (Signed)
Eye is red, draining and itchy. Sulfacetamide eye drops 2 drops qid for 3 -5 days. . Med sent to pharm. Pt notified.

## 2016-05-26 NOTE — Telephone Encounter (Signed)
Left message to return call to get symptoms °

## 2016-06-08 DIAGNOSIS — Z1211 Encounter for screening for malignant neoplasm of colon: Secondary | ICD-10-CM | POA: Diagnosis not present

## 2016-06-08 DIAGNOSIS — R140 Abdominal distension (gaseous): Secondary | ICD-10-CM | POA: Diagnosis not present

## 2016-06-08 DIAGNOSIS — K5904 Chronic idiopathic constipation: Secondary | ICD-10-CM | POA: Diagnosis not present

## 2016-06-21 ENCOUNTER — Other Ambulatory Visit: Payer: Self-pay | Admitting: *Deleted

## 2016-06-21 MED ORDER — CLONAZEPAM 1 MG PO TABS: 1.0000 mg | ORAL_TABLET | Freq: Every day | ORAL | 0 refills | Status: DC | PRN

## 2016-06-21 NOTE — Telephone Encounter (Signed)
Ok times 30 dp

## 2016-07-04 ENCOUNTER — Ambulatory Visit: Payer: Managed Care, Other (non HMO) | Admitting: Family Medicine

## 2016-07-10 ENCOUNTER — Other Ambulatory Visit: Payer: Self-pay | Admitting: *Deleted

## 2016-07-10 MED ORDER — TEMAZEPAM 30 MG PO CAPS: 30.0000 mg | ORAL_CAPSULE | Freq: Every day | ORAL | 0 refills | Status: DC

## 2016-07-12 ENCOUNTER — Ambulatory Visit: Payer: Self-pay | Admitting: Family Medicine

## 2016-07-12 ENCOUNTER — Other Ambulatory Visit: Payer: Self-pay | Admitting: Family Medicine

## 2016-07-14 ENCOUNTER — Other Ambulatory Visit: Payer: Self-pay | Admitting: Family Medicine

## 2016-07-14 NOTE — Telephone Encounter (Signed)
Last seen in 01/2016 for finger pain

## 2016-07-14 NOTE — Telephone Encounter (Signed)
30 d only , needs o v

## 2016-07-31 DIAGNOSIS — Z1211 Encounter for screening for malignant neoplasm of colon: Secondary | ICD-10-CM | POA: Diagnosis not present

## 2016-08-01 ENCOUNTER — Encounter: Payer: Self-pay | Admitting: Nurse Practitioner

## 2016-08-01 ENCOUNTER — Ambulatory Visit (INDEPENDENT_AMBULATORY_CARE_PROVIDER_SITE_OTHER): Payer: 59 | Admitting: Nurse Practitioner

## 2016-08-01 VITALS — BP 132/94 | Ht 63.0 in | Wt 137.0 lb

## 2016-08-01 DIAGNOSIS — F411 Generalized anxiety disorder: Secondary | ICD-10-CM | POA: Diagnosis not present

## 2016-08-01 DIAGNOSIS — F321 Major depressive disorder, single episode, moderate: Secondary | ICD-10-CM | POA: Diagnosis not present

## 2016-08-01 DIAGNOSIS — F5101 Primary insomnia: Secondary | ICD-10-CM | POA: Diagnosis not present

## 2016-08-01 MED ORDER — CLONAZEPAM 1 MG PO TABS: 1.0000 mg | ORAL_TABLET | Freq: Every day | ORAL | 5 refills | Status: DC | PRN

## 2016-08-01 MED ORDER — BUPROPION HCL ER (XL) 300 MG PO TB24: 300.0000 mg | ORAL_TABLET | Freq: Every day | ORAL | 1 refills | Status: DC

## 2016-08-01 MED ORDER — TRAZODONE HCL 50 MG PO TABS: 25.0000 mg | ORAL_TABLET | Freq: Every evening | ORAL | 0 refills | Status: DC | PRN

## 2016-08-01 NOTE — Progress Notes (Signed)
Subjective:  Presents for recheck on her depression. Overall stable on Wellbutrin. Continues to have significant issues with insomnia intermittently up to 3-4 days at a time. Has not identified any specific triggers. Could not take Ambien or Lunesta due to side effects. Has been taking temazepam 30 mg along with clonazepam 1 mg at bedtime for sleep. Has a history of chronic insomnia. Sometimes even these 2 medications together will not work. Denies any previous diagnosis of bipolar disorder.  Objective:   BP (!) 132/94   Ht 5\' 3"  (1.6 m)   Wt 137 lb 0.2 oz (62.1 kg)   LMP 01/03/2000   BMI 24.27 kg/m  NAD. Alert, oriented. Mildly anxious affect. Thoughts logical coherent and relevant. Dressed appropriately. Lungs clear. Heart regular rate rhythm.  Assessment:   Problem List Items Addressed This Visit      Other   Depression - Primary   Relevant Medications   traZODone (DESYREL) 50 MG tablet   buPROPion (WELLBUTRIN XL) 300 MG 24 hr tablet   Generalized anxiety disorder   Insomnia       Plan:   Meds ordered this encounter  Medications  . traZODone (DESYREL) 50 MG tablet    Sig: Take 0.5-1 tablets (25-50 mg total) by mouth at bedtime as needed for sleep.    Dispense:  30 tablet    Refill:  0    Order Specific Question:   Supervising Provider    Answer:   Merlyn AlbertLUKING, WILLIAM S [[id number]]  . buPROPion (WELLBUTRIN XL) 300 MG 24 hr tablet    Sig: Take 1 tablet (300 mg total) by mouth daily.    Dispense:  90 tablet    Refill:  1    Order Specific Question:   Supervising Provider    Answer:   Merlyn AlbertLUKING, WILLIAM S [[id number]]  . clonazePAM (KLONOPIN) 1 MG tablet    Sig: Take 1 tablet (1 mg total) by mouth daily as needed.    Dispense:  30 tablet    Refill:  5    May refill monthly    Order Specific Question:   Supervising Provider    Answer:   Merlyn AlbertLUKING, WILLIAM S [[id number]]   Discussed options at length. Advised patient not to take temazepam and clonazepam together within 4 hours of dosing due to  respiratory risk. Recommended patient to use one or the other for sleep. Trial of trazodone 50 mg, start with half tab by mouth daily at bedtime. DC med and call if any adverse affects.  Patient verbalizes understanding about her medications. Return in about 6 months (around 02/01/2017) for recheck.

## 2016-08-02 ENCOUNTER — Encounter: Payer: Self-pay | Admitting: Certified Nurse Midwife

## 2016-08-04 DIAGNOSIS — G5602 Carpal tunnel syndrome, left upper limb: Secondary | ICD-10-CM | POA: Diagnosis not present

## 2016-08-04 DIAGNOSIS — G5603 Carpal tunnel syndrome, bilateral upper limbs: Secondary | ICD-10-CM | POA: Diagnosis not present

## 2016-08-04 DIAGNOSIS — G5601 Carpal tunnel syndrome, right upper limb: Secondary | ICD-10-CM | POA: Diagnosis not present

## 2016-08-09 ENCOUNTER — Ambulatory Visit: Payer: Managed Care, Other (non HMO) | Admitting: Certified Nurse Midwife

## 2016-08-10 ENCOUNTER — Encounter: Payer: Self-pay | Admitting: Family Medicine

## 2016-08-10 ENCOUNTER — Ambulatory Visit (INDEPENDENT_AMBULATORY_CARE_PROVIDER_SITE_OTHER): Payer: 59 | Admitting: Family Medicine

## 2016-08-10 VITALS — BP 120/78 | Temp 97.5°F | Ht 63.0 in | Wt 134.6 lb

## 2016-08-10 DIAGNOSIS — R197 Diarrhea, unspecified: Secondary | ICD-10-CM

## 2016-08-10 DIAGNOSIS — I951 Orthostatic hypotension: Secondary | ICD-10-CM

## 2016-08-10 MED ORDER — DICYCLOMINE HCL 10 MG PO CAPS: ORAL_CAPSULE | ORAL | 0 refills | Status: DC

## 2016-08-10 MED ORDER — ONDANSETRON HCL 8 MG PO TABS: 8.0000 mg | ORAL_TABLET | Freq: Three times a day (TID) | ORAL | 0 refills | Status: DC | PRN

## 2016-08-10 NOTE — Progress Notes (Signed)
   Subjective:    Patient ID: Wynona Neatatherine L Whitley, female    DOB: 04/09/1965, 51 y.o.   MRN: [id number]  HPI Patient arrives with c/o abdominal pain, diarrhea and nausea since Monday. She relates intermittent diarrhea and nausea feeling weak woozy not eating much not drinking much past several days denies high fever chills sweats stool does not have mucus or blood in it. Does have history of IBS. Patient had been eating very healthy up until this past week when she went on vacation. She denies any travel otherwise denies questionable sources a water no one else around her is been sick  Review of Systems See above denies cough wheezing difficulty breathing fevers chills sweats    Objective:   Physical Exam Lungs are clear no crackles heart is regular pulses normal extremities no edema skin warm dry abdomen soft no guarding rebound tenderness Patient has mild orthostatic hypotension      Assessment & Plan:  Probable viral illness Could be irritable bowel Low refuge diet was given via information If not dramatically better by Monday may need stool testing Lab work ordered to make sure there is not electrolyte problem Off work next few days Imodium in the morning Bentyl  midday as needed for abdominal discomfort Zofran as needed for nausea-if worse go to ER

## 2016-08-11 LAB — CBC WITH DIFFERENTIAL/PLATELET
BASOS: 0 %
Basophils Absolute: 0 10*3/uL (ref 0.0–0.2)
EOS (ABSOLUTE): 0.2 10*3/uL (ref 0.0–0.4)
EOS: 2 %
HEMATOCRIT: 42.9 % (ref 34.0–46.6)
HEMOGLOBIN: 15 g/dL (ref 11.1–15.9)
Immature Grans (Abs): 0.1 10*3/uL (ref 0.0–0.1)
Immature Granulocytes: 1 %
LYMPHS ABS: 2 10*3/uL (ref 0.7–3.1)
Lymphs: 23 %
MCH: 31.1 pg (ref 26.6–33.0)
MCHC: 35 g/dL (ref 31.5–35.7)
MCV: 89 fL (ref 79–97)
MONOCYTES: 6 %
MONOS ABS: 0.5 10*3/uL (ref 0.1–0.9)
NEUTROS ABS: 5.8 10*3/uL (ref 1.4–7.0)
Neutrophils: 68 %
Platelets: 310 10*3/uL (ref 150–379)
RBC: 4.82 x10E6/uL (ref 3.77–5.28)
RDW: 13 % (ref 12.3–15.4)
WBC: 8.5 10*3/uL (ref 3.4–10.8)

## 2016-08-11 LAB — BASIC METABOLIC PANEL
BUN / CREAT RATIO: 13 (ref 9–23)
BUN: 12 mg/dL (ref 6–24)
CO2: 27 mmol/L (ref 20–29)
CREATININE: 0.92 mg/dL (ref 0.57–1.00)
Calcium: 10.4 mg/dL — ABNORMAL HIGH (ref 8.7–10.2)
Chloride: 96 mmol/L (ref 96–106)
GFR calc Af Amer: 84 mL/min/{1.73_m2} (ref >59)
GFR, EST NON AFRICAN AMERICAN: 73 mL/min/{1.73_m2} (ref >59)
Glucose: 92 mg/dL (ref 65–99)
Potassium: 5.1 mmol/L (ref 3.5–5.2)
SODIUM: 137 mmol/L (ref 134–144)

## 2016-08-11 LAB — SEDIMENTATION RATE: Sed Rate: 2 mm/hr (ref 0–40)

## 2016-08-11 LAB — HEPATIC FUNCTION PANEL
ALBUMIN: 4.4 g/dL (ref 3.5–5.5)
ALK PHOS: 105 IU/L (ref 39–117)
ALT: 32 IU/L (ref 0–32)
AST: 21 IU/L (ref 0–40)
BILIRUBIN, DIRECT: 0.18 mg/dL (ref 0.00–0.40)
Bilirubin Total: 0.7 mg/dL (ref 0.0–1.2)
TOTAL PROTEIN: 6.9 g/dL (ref 6.0–8.5)

## 2016-08-11 LAB — LIPASE: Lipase: 38 U/L (ref 14–72)

## 2016-08-14 ENCOUNTER — Telehealth: Payer: Self-pay | Admitting: Family Medicine

## 2016-08-14 ENCOUNTER — Encounter: Payer: Self-pay | Admitting: Family Medicine

## 2016-08-14 NOTE — Telephone Encounter (Signed)
Patient seen Dr. Lorin PicketScott on 08/10/16 for diarrhea.  She said that she is having trouble getting her bowels regulated.  She said that she started to be constipated over the weekend so she took a dose of Colace last night and she has had stomach cramps again.  Please advise.

## 2016-08-14 NOTE — Telephone Encounter (Signed)
Patient advised- May double up on the dicyclomine 10 mg may take 2 at a time maximum 3 times a day as needed for abdominal cramps, bland diet, if bloody or mucousy stools will be necessary do some stool studies. If patient is not doing better within the next 4-5 days Dr Lorin PicketScott recommend a follow-up office visit with Dr. Brett CanalesSteve sooner if  problems. Patient verbalized understanding.

## 2016-08-14 NOTE — Telephone Encounter (Signed)
May double up on the dicyclomine 10 mg may take 2 at a time maximum 3 times a day as needed for abdominal cramps, bland diet, if bloody or mucousy stools will be necessary do some stool studies. If patient is not doing better within the next 4-5 days I recommend a follow-up office visit with Dr. Brett CanalesSteve sooner if problems

## 2016-09-01 ENCOUNTER — Other Ambulatory Visit: Payer: Self-pay | Admitting: Nurse Practitioner

## 2016-09-01 MED ORDER — TRAZODONE HCL 50 MG PO TABS: ORAL_TABLET | ORAL | 1 refills | Status: DC

## 2016-09-01 NOTE — Telephone Encounter (Signed)
Last seen 08/01/16 

## 2016-09-13 ENCOUNTER — Ambulatory Visit: Payer: 59 | Admitting: Certified Nurse Midwife

## 2016-09-13 ENCOUNTER — Telehealth: Payer: Self-pay | Admitting: Certified Nurse Midwife

## 2016-09-13 NOTE — Telephone Encounter (Signed)
Patient arrived over 20 minutes late to her [facility]EX appointment this morning. She rescheduled to tomorrow morning, 09/14/16, with Leota Sauerseborah Leonard, CNM. Routing to provider for FYI only.

## 2016-09-13 NOTE — Progress Notes (Deleted)
51 y.o. [id number] Divorced  Caucasian Fe here for annual exam.    Patient's last menstrual period was 01/03/2000.          Sexually active: {yes no:314532}  The current method of family planning is {contraception:315051}.    Exercising: {yes AV:409811}no:314532}  {types:19826} Smoker:  {YES NO:22349}  Health Maintenance: Pap:  2012 neg History of Abnormal Pap: {YES NO:22349} MMG: 04-29-15 category c density birads 1:neg Self Breast exams: {YES NO:22349} Colonoscopy:  2018 f/u 2444yrs with 12 day prep BMD:   none TDaP:  2014 Shingles: no Pneumonia: no Hep C and HIV: *** Labs: ***   reports that she has quit smoking. She has never used smokeless tobacco. She reports that she does not drink alcohol or use drugs.  Past Medical History:  Diagnosis Date  . Anxiety   . Chronic joint pain   . Depression   . GDM (gestational diabetes mellitus)    History  . IBS (irritable bowel syndrome)   . Reflux     Past Surgical History:  Procedure Laterality Date  . ABDOMINAL HYSTERECTOMY  2002   TAH- Fibroids , endometriosis; ov retained  . BACK SURGERY  2005   L5-S1 Fusion  . CESAREAN SECTION  1987  . ESOPHAGOGASTRODUODENOSCOPY    . LAPAROSCOPIC CHOLECYSTECTOMY  1995  . MASTOPEXY  2012  . TONSILLECTOMY    . TUBAL LIGATION  1993   BTSP    Current Outpatient Prescriptions  Medication Sig Dispense Refill  . albuterol (PROVENTIL HFA;VENTOLIN HFA) 108 (90 Base) MCG/ACT inhaler Inhale 2 puffs into the lungs every 6 (six) hours as needed for wheezing or shortness of breath. 1 Inhaler 0  . buPROPion (WELLBUTRIN XL) 300 MG 24 hr tablet Take 1 tablet (300 mg total) by mouth daily. 90 tablet 1  . clonazePAM (KLONOPIN) 1 MG tablet Take 1 tablet (1 mg total) by mouth daily as needed. 30 tablet 5  . diclofenac (VOLTAREN) 75 MG EC tablet Take 1 tablet (75 mg total) by mouth 2 (two) times daily. Take with food (Patient not taking: Reported on 08/01/2016) 60 tablet 5  . dicyclomine (BENTYL) 10 MG capsule 1 tid prn  30 capsule 0  . Multiple Vitamin (MULTIVITAMIN) tablet Take 1 tablet by mouth daily.    . ondansetron (ZOFRAN) 8 MG tablet Take 1 tablet (8 mg total) by mouth every 8 (eight) hours as needed for nausea. 20 tablet 0  . sulfacetamide (BLEPH-10) 10 % ophthalmic solution Take 2 drops 4 times a day for 3 -5 days 15 mL 0  . temazepam (RESTORIL) 30 MG capsule Take 1 capsule (30 mg total) by mouth at bedtime. 30 capsule 0  . traZODone (DESYREL) 50 MG tablet TAKE 1/2-1 TABLET BY MOUTH AT BEDTIME AS NEEDED FOR SLEEP. 90 tablet 1   No current facility-administered medications for this visit.     Family History  Problem Relation Age of Onset  . Hypertension Mother   . Diabetes Mother        Type 2  . Thyroid disease Mother   . Glaucoma Mother   . Hypertension Father   . Atrial fibrillation Father   . Cancer Father        bladder & liver  . Heart disease Father   . Crohn's disease Father   . Heart attack Maternal Grandfather   . CAD Maternal Uncle   . Hypertension Brother   . Melanoma Brother   . Diabetes Maternal Grandmother  Type 2  . Hypertension Maternal Grandmother   . Heart disease Maternal Grandmother   . Heart disease Paternal Grandmother     ROS:  Pertinent items are noted in HPI.  Otherwise, a comprehensive ROS was negative.  Exam:   LMP 01/03/2000    Ht Readings from Last 3 Encounters:  08/10/16  (1.6 m)  08/01/16  (1.6 m)  01/05/16  (1.6 m)    General appearance: alert, cooperative and appears stated age Head: Normocephalic, without obvious abnormality, atraumatic Neck: no adenopathy, supple, symmetrical, trachea midline and thyroid {EXAM; THYROID:18604} Lungs: clear to auscultation bilaterally Breasts: {Exam; breast:13139::"normal appearance, no masses or tenderness"} Heart: regular rate and rhythm Abdomen: soft, non-tender; no masses,  no organomegaly Extremities: extremities normal, atraumatic, no cyanosis or edema Skin: Skin color, texture,  turgor normal. No rashes or lesions Lymph nodes: Cervical, supraclavicular, and axillary nodes normal. No abnormal inguinal nodes palpated Neurologic: Grossly normal   Pelvic: External genitalia:  no lesions              Urethra:  normal appearing urethra with no masses, tenderness or lesions              Bartholin's and Skene's: normal                 Vagina: normal appearing vagina with normal color and discharge, no lesions              Cervix: {exam; cervix:14595}              Pap taken: {yes no:314532} Bimanual Exam:  Uterus:  {exam; uterus:12215}              Adnexa: {exam; adnexa:12223}               Rectovaginal: Confirms               Anus:  normal sphincter tone, no lesions  Chaperone present: ***  A:  Well Woman with normal exam  P:   Reviewed health and wellness pertinent to exam  Pap smear: {YES NO:22349}  {plan; gyn:5269::"mammogram","pap smear","return annually or prn"}  An After Visit Summary was printed and given to the patient.

## 2016-09-13 NOTE — Progress Notes (Signed)
51 y.o. [id number] Divorced  Caucasian Fe here for annual exam. Menopausal no HRT. Having hot flashes off and on. Some vaginal dryness.  No partner change, not sexually active due to partner being older.Sees PCP for medication management only of anxiety and depression. Recent colonoscopy, needs repeat 3 years due to finding. Screening labs if needed. No other health issues.  Patient's last menstrual period was 01/03/2000.          Sexually active: No.  The current method of family planning is status post hysterectomy.    Exercising: No.  exercise Smoker:  no  Health Maintenance: Pap:  2012 neg History of Abnormal Pap: yes over 30 yrs ago MMG:  04-29-15 category c density birads 1:neg Self Breast exams: no Colonoscopy:  2018 f/u in 65yrs with 12 day prep BMD:   None, not due yet TDaP:  2014 Shingles: no Pneumonia: no Hep C and HIV: not done Labs: none    reports that she has quit smoking. She has never used smokeless tobacco. She reports that she does not drink alcohol or use drugs.  Past Medical History:  Diagnosis Date  . Anxiety   . Chronic joint pain   . Depression   . GDM (gestational diabetes mellitus)    History  . IBS (irritable bowel syndrome)   . Reflux     Past Surgical History:  Procedure Laterality Date  . ABDOMINAL HYSTERECTOMY  2002   TAH- Fibroids , endometriosis; ov retained  . BACK SURGERY  2005   L5-S1 Fusion  . CESAREAN SECTION  1987  . ESOPHAGOGASTRODUODENOSCOPY    . LAPAROSCOPIC CHOLECYSTECTOMY  1995  . MASTOPEXY  2012  . TONSILLECTOMY    . TUBAL LIGATION  1993   BTSP    Current Outpatient Prescriptions  Medication Sig Dispense Refill  . buPROPion (WELLBUTRIN XL) 300 MG 24 hr tablet Take 1 tablet (300 mg total) by mouth daily. 90 tablet 1  . clonazePAM (KLONOPIN) 1 MG tablet Take 1 tablet (1 mg total) by mouth daily as needed. 30 tablet 5  . dicyclomine (BENTYL) 10 MG capsule 1 tid prn 30 capsule 0  . doxycycline (VIBRA-TABS) 100 MG tablet TAKE 1  TABLET BY MOUTH TWICE DAILY WITH FOOD WATER  3  . Multiple Vitamin (MULTIVITAMIN) tablet Take 1 tablet by mouth daily.    . traZODone (DESYREL) 50 MG tablet TAKE 1/2-1 TABLET BY MOUTH AT BEDTIME AS NEEDED FOR SLEEP. 90 tablet 1   No current facility-administered medications for this visit.     Family History  Problem Relation Age of Onset  . Hypertension Mother   . Diabetes Mother        Type 2  . Thyroid disease Mother   . Glaucoma Mother   . Hypertension Father   . Atrial fibrillation Father   . Cancer Father        bladder & liver  . Heart disease Father   . Crohn's disease Father   . Heart attack Maternal Grandfather   . CAD Maternal Uncle   . Hypertension Brother   . Melanoma Brother   . Diabetes Maternal Grandmother        Type 2  . Hypertension Maternal Grandmother   . Heart disease Maternal Grandmother   . Heart disease Paternal Grandmother     ROS:  Pertinent items are noted in HPI.  Otherwise, a comprehensive ROS was negative.  Exam:   BP 116/78   Pulse 68   Resp 16   Ht  5' 2.75" (1.594 m)   Wt 142 lb (64.4 kg)   LMP 01/03/2000   BMI 25.36 kg/m  Height: 5' 2.75" (159.4 cm) Ht Readings from Last 3 Encounters:  09/14/16 5' 2.75" (1.594 m)  08/10/16 5\' 3"  (1.6 m)  08/01/16 5\' 3"  (1.6 m)    General appearance: alert, cooperative and appears stated age Head: Normocephalic, without obvious abnormality, atraumatic Neck: no adenopathy, supple, symmetrical, trachea midline and thyroid normal to inspection and palpation Lungs: clear to auscultation bilaterally Breasts: normal appearance, no masses or tenderness, No nipple retraction or dimpling, No nipple discharge or bleeding, No axillary or supraclavicular adenopathy Heart: regular rate and rhythm Abdomen: soft, non-tender; no masses,  no organomegaly Extremities: extremities normal, atraumatic, no cyanosis or edema Skin: Skin color, texture, turgor normal. No rashes or lesions Lymph nodes: Cervical,  supraclavicular, and axillary nodes normal. No abnormal inguinal nodes palpated Neurologic: Grossly normal   Pelvic: External genitalia:  no lesions              Urethra:  normal appearing urethra with no masses, tenderness or lesions              Bartholin's and Skene's: normal                 Vagina: normal appearing vagina with normal color and discharge, no lesions              Cervix: absent              Pap taken: No. Bimanual Exam:  Uterus:  uterus absent              Adnexa: normal adnexa and no mass, fullness, tenderness               Rectovaginal: Confirms               Anus:  normal sphincter tone, no lesions  Chaperone present: yes  A:  Well Woman with normal exam  Menopausal no HRT  Vaginal dryness  Depression/anxiety with MD management all stable  Colonoscopy recently done, will need repeat in 3 years due finding ? Etiology  Mammogram due, patient to schedule  Screening labs  P:   Reviewed health and wellness pertinent to exam  Aware if vaginal bleeding to advise  Discussed vaginal dryness finding and coconut oil option for comfort. Information and instructions given.  Stressed importance of mammogram .  Continue follow up with MD as indicated  Labs: Hep C,HIV, TSH, Vitamin D  Pap smear: no   counseled on breast self exam, mammography screening, adequate intake of calcium and vitamin D, diet and exercise  return annually or prn  An After Visit Summary was printed and given to the patient.

## 2016-09-14 ENCOUNTER — Encounter: Payer: Self-pay | Admitting: Certified Nurse Midwife

## 2016-09-14 ENCOUNTER — Ambulatory Visit (INDEPENDENT_AMBULATORY_CARE_PROVIDER_SITE_OTHER): Payer: 59 | Admitting: Certified Nurse Midwife

## 2016-09-14 VITALS — BP 116/78 | HR 68 | Resp 16 | Ht 62.75 in | Wt 142.0 lb

## 2016-09-14 DIAGNOSIS — Z01419 Encounter for gynecological examination (general) (routine) without abnormal findings: Secondary | ICD-10-CM

## 2016-09-14 DIAGNOSIS — N951 Menopausal and female climacteric states: Secondary | ICD-10-CM

## 2016-09-14 DIAGNOSIS — E559 Vitamin D deficiency, unspecified: Secondary | ICD-10-CM | POA: Diagnosis not present

## 2016-09-14 DIAGNOSIS — Z0000 Encounter for general adult medical examination without abnormal findings: Secondary | ICD-10-CM

## 2016-09-14 DIAGNOSIS — Z8659 Personal history of other mental and behavioral disorders: Secondary | ICD-10-CM | POA: Diagnosis not present

## 2016-09-14 NOTE — Patient Instructions (Signed)

## 2016-09-15 LAB — VITAMIN D 25 HYDROXY (VIT D DEFICIENCY, FRACTURES): VIT D 25 HYDROXY: 24.4 ng/mL — AB (ref 30.0–100.0)

## 2016-09-15 LAB — HIV ANTIBODY (ROUTINE TESTING W REFLEX): HIV Screen 4th Generation wRfx: NONREACTIVE

## 2016-09-15 LAB — HEPATITIS C ANTIBODY

## 2016-09-15 LAB — TSH: TSH: 1.72 u[IU]/mL (ref 0.450–4.500)

## 2016-09-16 ENCOUNTER — Other Ambulatory Visit: Payer: Self-pay | Admitting: Certified Nurse Midwife

## 2016-09-16 DIAGNOSIS — E559 Vitamin D deficiency, unspecified: Secondary | ICD-10-CM

## 2016-09-19 ENCOUNTER — Other Ambulatory Visit: Payer: Self-pay

## 2016-09-19 DIAGNOSIS — E559 Vitamin D deficiency, unspecified: Secondary | ICD-10-CM

## 2016-09-21 ENCOUNTER — Other Ambulatory Visit: Payer: Self-pay | Admitting: Certified Nurse Midwife

## 2016-09-21 DIAGNOSIS — Z1231 Encounter for screening mammogram for malignant neoplasm of breast: Secondary | ICD-10-CM

## 2016-09-29 ENCOUNTER — Ambulatory Visit
Admission: RE | Admit: 2016-09-29 | Discharge: 2016-09-29 | Disposition: A | Discharge status: Home / Self Care | Payer: 59 | Source: Ambulatory Visit | Attending: Certified Nurse Midwife | Admitting: Certified Nurse Midwife

## 2016-09-29 DIAGNOSIS — Z1231 Encounter for screening mammogram for malignant neoplasm of breast: Secondary | ICD-10-CM

## 2016-12-05 ENCOUNTER — Other Ambulatory Visit: Payer: Self-pay | Admitting: Nurse Practitioner

## 2017-01-07 ENCOUNTER — Other Ambulatory Visit: Payer: Self-pay | Admitting: Nurse Practitioner

## 2017-01-23 ENCOUNTER — Ambulatory Visit: Payer: Self-pay | Admitting: Family Medicine

## 2017-01-25 DIAGNOSIS — M7581 Other shoulder lesions, right shoulder: Secondary | ICD-10-CM | POA: Insufficient documentation

## 2017-01-25 DIAGNOSIS — M7582 Other shoulder lesions, left shoulder: Secondary | ICD-10-CM | POA: Insufficient documentation

## 2017-01-27 ENCOUNTER — Other Ambulatory Visit: Payer: Self-pay | Admitting: Nurse Practitioner

## 2017-01-29 ENCOUNTER — Telehealth: Payer: Self-pay | Admitting: Nurse Practitioner

## 2017-01-29 NOTE — Telephone Encounter (Signed)
Prescription did not make it to pharmacy. Please send again

## 2017-01-29 NOTE — Telephone Encounter (Signed)
Sent a second time on Monday. Let me know if it does not go through.

## 2017-01-30 ENCOUNTER — Ambulatory Visit: Payer: Self-pay | Admitting: Family Medicine

## 2017-01-30 NOTE — Telephone Encounter (Signed)
Called [facility] to verify they received the prescription and Chase at the pharm said it was ready. Pt notified.

## 2017-01-31 ENCOUNTER — Ambulatory Visit: Payer: 59 | Admitting: Nurse Practitioner

## 2017-01-31 ENCOUNTER — Encounter: Payer: Self-pay | Admitting: Family Medicine

## 2017-01-31 ENCOUNTER — Other Ambulatory Visit: Payer: Self-pay | Admitting: Nurse Practitioner

## 2017-01-31 ENCOUNTER — Ambulatory Visit: Payer: Managed Care, Other (non HMO) | Admitting: Family Medicine

## 2017-01-31 ENCOUNTER — Telehealth: Payer: Self-pay | Admitting: Family Medicine

## 2017-01-31 VITALS — BP 154/100 | Ht 62.75 in | Wt 143.0 lb

## 2017-01-31 DIAGNOSIS — R030 Elevated blood-pressure reading, without diagnosis of hypertension: Secondary | ICD-10-CM

## 2017-01-31 DIAGNOSIS — F411 Generalized anxiety disorder: Secondary | ICD-10-CM

## 2017-01-31 DIAGNOSIS — Z131 Encounter for screening for diabetes mellitus: Secondary | ICD-10-CM | POA: Diagnosis not present

## 2017-01-31 DIAGNOSIS — R197 Diarrhea, unspecified: Secondary | ICD-10-CM

## 2017-01-31 DIAGNOSIS — I951 Orthostatic hypotension: Secondary | ICD-10-CM | POA: Diagnosis not present

## 2017-01-31 DIAGNOSIS — F5101 Primary insomnia: Secondary | ICD-10-CM

## 2017-01-31 LAB — POCT GLUCOSE (DEVICE FOR HOME USE): POC Glucose: 93 mg/dl (ref 70–99)

## 2017-01-31 MED ORDER — CLONAZEPAM 1 MG PO TABS: 1.0000 mg | ORAL_TABLET | Freq: Every day | ORAL | 5 refills | Status: DC | PRN

## 2017-01-31 NOTE — Telephone Encounter (Signed)
Ok six mo 

## 2017-01-31 NOTE — Telephone Encounter (Signed)
Requesting refills added to her clonazepam 1 mg tabs.  [facility] [address] [place], [place]

## 2017-01-31 NOTE — Progress Notes (Signed)
   Subjective:    Patient ID: Angel Silva, female    DOB: 01/24/1965, 52 y.o.   MRN: [id number] Patient arrives with numerous concerns Anxiety  Presents for follow-up visit.     Wants to get blood sugar checked due to family history, blurry vision, weight gain and not feeling well.   Patient notes has stopped being compliant with antidepressant medication. No obvious side effects.  when she took the medication she reports that overall continues to help depression substantially. No thoughts of homicide or suicide. Would like to maintain medication.  Dim energy not feeling aw swell, dim vidison at times  In new glasss maybe helping   No energy , no sig enthusiasm  No initiative Some family history of elevated blood pressure. Concerned about elevated blood pressure today.   Results for orders placed or performed in visit on 01/31/17  POCT Glucose (Device for Home Use)  Result Value Ref Range   Glucose Fasting, POC  70 - 99 mg/dL   POC Glucose 93 70 - 99 mg/dl   Trying ot exercise but njt doing any wellbutrin not taking  Review of Systems No headache, no major weight loss or weight gain, no chest pain no back pain abdominal pain no change in bowel habits complete ROS otherwise negative     Objective:   Physical Exam  Alert and oriented, vitals reviewed and stable, NAD ENT-TM's and ext canals WNL bilat via otoscopic exam Soft palate, tonsils and post pharynx WNL via oropharyngeal exam Neck-symmetric, no masses; thyroid nonpalpable and nontender Pulmonary-no tachypnea or accessory muscle use; Clear without wheezes via auscultation Card--no abnrml murmurs, rhythm reg and rate WNL Carotid pulses symmetric, without bruits       Assessment & Plan:  Impression elevated blood pressure discussed at length 3 out of 5 in the last year visits of blood pressure was fine.  Encouraged exercise and diminishing salt intake  2.  Fatigue multifactorial deconditioning certainly  implies  3.  Chronic depression anxiety not in very good control with substantial noncompliance discussed.  Importance of compliance encouraged  4.  Insomnia ongoing  We will check some blood work to look for causes of fatigue.  Patient encouraged to wait 6 weeks until she gets back on her vitamin D faithfully  Greater than 50% of this 25 minute face to face visit was spent in counseling and discussion and coordination of care regarding the above diagnosis/diagnosies

## 2017-01-31 NOTE — Telephone Encounter (Signed)
Prescription sent to pharmacy by [name]. Patient notified.

## 2017-02-26 ENCOUNTER — Telehealth: Payer: Self-pay | Admitting: *Deleted

## 2017-02-26 ENCOUNTER — Other Ambulatory Visit: Payer: Self-pay | Admitting: Nurse Practitioner

## 2017-02-26 MED ORDER — CLONAZEPAM 1 MG PO TABS: 1.0000 mg | ORAL_TABLET | Freq: Every day | ORAL | 4 refills | Status: DC | PRN

## 2017-02-26 NOTE — Telephone Encounter (Signed)
Patient is aware 

## 2017-02-26 NOTE — Telephone Encounter (Signed)
Fax from [facility] in [place] requesting rx for clonazepam 1mg  on po qd. Sent fax back last week asking that they transfer med because this was sent on 1/30 with refills. Fax came in today stating they cannot transfer because the [facility] [place] and never filled.

## 2017-02-26 NOTE — Progress Notes (Signed)
PMP reviewed

## 2017-02-26 NOTE — Telephone Encounter (Signed)
[facility] to [facility].

## 2017-05-28 ENCOUNTER — Other Ambulatory Visit: Payer: Self-pay | Admitting: Nurse Practitioner

## 2017-06-12 ENCOUNTER — Encounter: Payer: Self-pay | Admitting: Nurse Practitioner

## 2017-06-12 ENCOUNTER — Ambulatory Visit: Payer: BLUE CROSS/BLUE SHIELD | Admitting: Nurse Practitioner

## 2017-06-12 VITALS — BP 130/84 | HR 85 | Ht 63.0 in | Wt 141.4 lb

## 2017-06-12 DIAGNOSIS — F411 Generalized anxiety disorder: Secondary | ICD-10-CM

## 2017-06-12 DIAGNOSIS — F5101 Primary insomnia: Secondary | ICD-10-CM | POA: Diagnosis not present

## 2017-06-12 MED ORDER — CLONAZEPAM 1 MG PO TABS: 1.0000 mg | ORAL_TABLET | Freq: Every day | ORAL | 5 refills | Status: DC | PRN

## 2017-06-14 ENCOUNTER — Encounter: Payer: Self-pay | Admitting: Nurse Practitioner

## 2017-06-14 NOTE — Progress Notes (Signed)
Subjective:  Presents for routine follow up and to complete her wellness form for work. Gets regular GYN exams with gynecology. Has a very active job but no regular exercise outside of this. Trying to do better with her diet. Take Klonopin and Trazodone as needed for sleep. Part of this is due to her work schedule. Regular vision and dental exams. Anxiety has been stable off Wellbutrin.   Objective:   BP 130/84 (BP Location: Left Arm, Patient Position: Sitting)   Pulse 85   Ht 5\' 3"  (1.6 m)   Wt 141 lb 6.4 oz (64.1 kg)   LMP 01/03/2000   SpO2 98%   BMI 25.05 kg/m  NAD. Alert, oriented. Thoughts logical, coherent and relevant. Dressed appropriately. Cheerful affect. TMs nl. Pharynx clear. Neck supple with minimal adenopathy. Lungs clear. Heart RRR. Abdomen soft, non tender. Thyroid nontender, no mass or goiter noted.   Assessment:   Problem List Items Addressed This Visit      Other   Generalized anxiety disorder   Insomnia - Primary   Relevant Orders   Basic Metabolic Panel (BMET)   Lipid Profile   Hepatic function panel   Vitamin D (25 hydroxy)       Plan:   Meds ordered this encounter  Medications  . clonazePAM (KLONOPIN) 1 MG tablet    Sig: Take 1 tablet (1 mg total) by mouth daily as needed.    Dispense:  30 tablet    Refill:  5    May refill monthly    Order Specific Question:   Supervising Provider    Answer:   Merlyn AlbertLUKING, WILLIAM S [[id number]]   Continue current medications as directed. Labs pending.  Return in about 6 months (around 12/12/2017) for recheck.

## 2017-06-18 ENCOUNTER — Ambulatory Visit: Payer: BLUE CROSS/BLUE SHIELD | Admitting: Obstetrics and Gynecology

## 2017-06-18 ENCOUNTER — Other Ambulatory Visit: Payer: Self-pay

## 2017-06-18 ENCOUNTER — Encounter: Payer: Self-pay | Admitting: Obstetrics and Gynecology

## 2017-06-18 VITALS — BP 130/82 | HR 72 | Resp 16 | Wt 135.0 lb

## 2017-06-18 DIAGNOSIS — Z113 Encounter for screening for infections with a predominantly sexual mode of transmission: Secondary | ICD-10-CM

## 2017-06-18 DIAGNOSIS — N763 Subacute and chronic vulvitis: Secondary | ICD-10-CM

## 2017-06-18 MED ORDER — BETAMETHASONE VALERATE 0.1 % EX OINT: TOPICAL_OINTMENT | CUTANEOUS | 0 refills | Status: DC

## 2017-06-18 NOTE — Progress Notes (Signed)
GYNECOLOGY  VISIT   HPI: 52 y.o.   Divorced  Caucasian  female   [phone] with Patient's last menstrual period was 01/03/2000.   here c/o vaginal discharge and itching. Patient is requesting STD testing. The vaginal d/c and itching started a week ago, she self treated with over the counter 1 day treatment. The discharge was yellow, thick, improved some with treatment. The itching has also improved some, still irritated. No odor. She is sexually active, new partner in last month. Slight discomfort in her LLQ for the last few days. The pain is constant dull, aching sensation up to a 3/10 in severity. No fevers, she does c/o fatigue in the last few days. No bowel or bladder c/o.       GYNECOLOGIC HISTORY: Patient's last menstrual period was 01/03/2000. Contraception:hysterectomy  Menopausal hormone therapy: none        OB History    Gravida  5   Para  3   Term  1   Preterm  2   AB  2   Living  3     SAB      TAB  1   Ectopic      Multiple      Live Births  3              Patient Active Problem List   Diagnosis Date Noted  . Generalized anxiety disorder 12/29/2014  . Chronic left shoulder pain 07/11/2013  . Insomnia 04/11/2013  . Atypical chest pain 01/17/2013  . Dyspnea 01/03/2013  . Esophageal reflux 05/30/2012  . Depression 05/30/2012  . Chronic cough 05/30/2012    Past Medical History:  Diagnosis Date  . Abnormal Pap smear of cervix    over 30 yrs ago  . Anxiety   . Chronic joint pain   . Depression   . GDM (gestational diabetes mellitus)    History  . IBS (irritable bowel syndrome)   . Reflux     Past Surgical History:  Procedure Laterality Date  . ABDOMINAL HYSTERECTOMY  2002   TAH- Fibroids , endometriosis; ov retained  . BACK SURGERY  2005   L5-S1 Fusion  . CESAREAN SECTION  1987  . ESOPHAGOGASTRODUODENOSCOPY    . LAPAROSCOPIC CHOLECYSTECTOMY  1995  . MASTOPEXY  2012  . REDUCTION MAMMAPLASTY Bilateral   . TONSILLECTOMY    . TUBAL  LIGATION  1993   BTSP    Current Outpatient Medications  Medication Sig Dispense Refill  . clonazePAM (KLONOPIN) 1 MG tablet Take 1 tablet (1 mg total) by mouth daily as needed. 30 tablet 5  . Multiple Vitamin (MULTIVITAMIN) tablet Take 1 tablet by mouth daily.    . traZODone (DESYREL) 50 MG tablet TAKE 1/2 TO 1 (ONE-HALF TO ONE) TABLET BY MOUTH ONCE DAILY AT BEDTIME AS NEEDED FOR SLEEP 30 tablet 1   No current facility-administered medications for this visit.      ALLERGIES: Effexor [venlafaxine hcl] and Zoloft [sertraline hcl]  Family History  Problem Relation Age of Onset  . Hypertension Mother   . Diabetes Mother        Type 2  . Thyroid disease Mother   . Glaucoma Mother   . Hypertension Father   . Atrial fibrillation Father   . Cancer Father        bladder & liver  . Heart disease Father   . Crohn's disease Father   . Heart attack Maternal Grandfather   . Diabetes Maternal Grandmother  Type 2  . Hypertension Maternal Grandmother   . Heart disease Maternal Grandmother   . Heart disease Paternal Grandmother   . CAD Maternal Uncle   . Hypertension Brother   . Melanoma Brother   . Breast cancer Neg Hx     Social History   Socioeconomic History  . Marital status: Divorced    Spouse name: Not on file  . Number of children: Not on file  . Years of education: Not on file  . Highest education level: Not on file  Occupational History  . Not on file  Social Needs  . Financial resource strain: Not on file  . Food insecurity:    Worry: Not on file    Inability: Not on file  . Transportation needs:    Medical: Not on file    Non-medical: Not on file  Tobacco Use  . Smoking status: Former Games developermoker  . Smokeless tobacco: Never Used  Substance and Sexual Activity  . Alcohol use: No  . Drug use: No  . Sexual activity: Never    Partners: Male    Birth control/protection: Surgical    Comment: TAH  Lifestyle  . Physical activity:    Days per week: Not on file     Minutes per session: Not on file  . Stress: Not on file  Relationships  . Social connections:    Talks on phone: Not on file    Gets together: Not on file    Attends religious service: Not on file    Active member of club or organization: Not on file    Attends meetings of clubs or organizations: Not on file    Relationship status: Not on file  . Intimate partner violence:    Fear of current or ex partner: Not on file    Emotionally abused: Not on file    Physically abused: Not on file    Forced sexual activity: Not on file  Other Topics Concern  . Not on file  Social History Narrative  . Not on file    Review of Systems  Constitutional: Negative.   HENT: Negative.   Eyes: Negative.   Respiratory: Negative.   Cardiovascular: Negative.   Gastrointestinal: Negative.   Genitourinary:       Vaginal discharge and itching   Musculoskeletal: Negative.   Skin: Negative.   Neurological: Negative.   Endo/Heme/Allergies: Negative.   Psychiatric/Behavioral: Negative.     PHYSICAL EXAMINATION:    BP 130/82 (BP Location: Right Arm, Patient Position: Sitting, Cuff Size: Normal)   Pulse 72   Resp 16   Wt 135 lb (61.2 kg)   LMP 01/03/2000   BMI 23.91 kg/m     General appearance: alert, cooperative and appears stated age Abdomen: soft, mildly tender in the LLQ, no rebound, no guarding, no masses, not distended  Pelvic: External genitalia:  no lesions, mild erythema              Urethra:  normal appearing urethra with no masses, tenderness or lesions              Bartholins and Skenes: normal                 Vagina: normal appearing vagina with normal color, no discharge, no lesions              Cervix: absent              Bimanual Exam:  Uterus:  uterus absent  Adnexa: no mass, fullness, tenderness               Chaperone was present for exam.  ASSESSMENT Mild vulvitis, self treated last week for possible yeast. No d/c on exam today Screening STD, new  partner    PLAN Affirm Steroid ointment for vulvar itching/irritation Can also use Vaseline as needed STD testing    An After Visit Summary was printed and given to the patient.

## 2017-06-19 ENCOUNTER — Telehealth: Payer: Self-pay

## 2017-06-19 LAB — HEP, RPR, HIV PANEL
HEP B S AG: NEGATIVE
HIV SCREEN 4TH GENERATION: NONREACTIVE
RPR: NONREACTIVE

## 2017-06-19 LAB — HEPATITIS C ANTIBODY: Hep C Virus Ab: <0.1 s/co ratio (ref 0.0–0.9)

## 2017-06-19 LAB — VAGINITIS/VAGINOSIS, DNA PROBE
Candida Species: NEGATIVE
Gardnerella vaginalis: POSITIVE — AB
Trichomonas vaginosis: NEGATIVE

## 2017-06-19 LAB — GC/CHLAMYDIA PROBE AMP
CHLAMYDIA, DNA PROBE: NEGATIVE
Neisseria gonorrhoeae by PCR: NEGATIVE

## 2017-06-19 MED ORDER — METRONIDAZOLE 500 MG PO TABS: 500.0000 mg | ORAL_TABLET | Freq: Two times a day (BID) | ORAL | 0 refills | Status: DC

## 2017-06-19 NOTE — Telephone Encounter (Signed)
Spoke with patient. Results given. Patient verbalizes understanding. Rx for Flagyl 500 mg po BID x 7 days #14 0RF sent to pharmacy on file. Avoid alcohol during treatment and 24 hours after completing medication. Don't mix with alcohol if mixed can cause severe nausea, vomiting and abdominal cramping.Patient verbalizes understanding. Encounter closed. 

## 2017-06-19 NOTE — Telephone Encounter (Signed)
Left message to call Elsie Baynes at [phone]. 

## 2017-06-19 NOTE — Telephone Encounter (Signed)
-----   Message from Romualdo BolkJill Evelyn Jertson, MD sent at 06/19/2017 12:31 PM EDT ----- Please inform the patient that her vaginitis probe was + for BV and treat with flagyl (either oral or vaginal, her choice), no ETOH while on Flagyl.  Oral: Flagyl 500 mg BID x 7 days, or Vaginal: Metrogel, 1 applicator per vagina q day x 5 days. Blood work was normal. Negative for trich, genprobe is pending.

## 2017-07-09 LAB — HEPATIC FUNCTION PANEL
ALT: 21 IU/L (ref 0–32)
AST: 18 IU/L (ref 0–40)
Albumin: 4.5 g/dL (ref 3.5–5.5)
Alkaline Phosphatase: 74 IU/L (ref 39–117)
Bilirubin Total: 1.1 mg/dL (ref 0.0–1.2)
Bilirubin, Direct: 0.24 mg/dL (ref 0.00–0.40)
TOTAL PROTEIN: 7 g/dL (ref 6.0–8.5)

## 2017-07-09 LAB — BASIC METABOLIC PANEL
BUN/Creatinine Ratio: 15 (ref 9–23)
BUN: 14 mg/dL (ref 6–24)
CALCIUM: 10 mg/dL (ref 8.7–10.2)
CO2: 25 mmol/L (ref 20–29)
CREATININE: 0.93 mg/dL (ref 0.57–1.00)
Chloride: 101 mmol/L (ref 96–106)
GFR, EST AFRICAN AMERICAN: 82 mL/min/{1.73_m2} (ref >59)
GFR, EST NON AFRICAN AMERICAN: 71 mL/min/{1.73_m2} (ref >59)
Glucose: 90 mg/dL (ref 65–99)
POTASSIUM: 4.5 mmol/L (ref 3.5–5.2)
Sodium: 138 mmol/L (ref 134–144)

## 2017-07-09 LAB — VITAMIN D 25 HYDROXY (VIT D DEFICIENCY, FRACTURES): VIT D 25 HYDROXY: 21.6 ng/mL — AB (ref 30.0–100.0)

## 2017-07-09 LAB — LIPID PANEL
CHOL/HDL RATIO: 2.9 ratio (ref 0.0–4.4)
Cholesterol, Total: 206 mg/dL — ABNORMAL HIGH (ref 100–199)
HDL: 70 mg/dL (ref >39)
LDL CALC: 111 mg/dL — AB (ref 0–99)
TRIGLYCERIDES: 123 mg/dL (ref 0–149)
VLDL CHOLESTEROL CAL: 25 mg/dL (ref 5–40)

## 2017-07-11 ENCOUNTER — Telehealth: Payer: Self-pay | Admitting: Nurse Practitioner

## 2017-07-11 ENCOUNTER — Other Ambulatory Visit: Payer: Self-pay | Admitting: Nurse Practitioner

## 2017-07-11 MED ORDER — VITAMIN D (ERGOCALCIFEROL) 1.25 MG (50000 UNIT) PO CAPS: 50000.0000 [IU] | ORAL_CAPSULE | ORAL | 2 refills | Status: DC

## 2017-07-11 NOTE — Telephone Encounter (Signed)
Patient said that [facility] from [facility] yesterday via MyChart telling her that she was going to call in Vitamin D for her.  Pharmacy doesn't have anything.  Please advise.    [facility]

## 2017-07-11 NOTE — Telephone Encounter (Signed)
Done

## 2017-07-13 ENCOUNTER — Other Ambulatory Visit: Payer: Self-pay | Admitting: Nurse Practitioner

## 2017-07-30 ENCOUNTER — Ambulatory Visit: Payer: Managed Care, Other (non HMO) | Admitting: Family Medicine

## 2017-09-19 ENCOUNTER — Encounter: Payer: Self-pay | Admitting: Family Medicine

## 2017-09-19 ENCOUNTER — Ambulatory Visit: Payer: BLUE CROSS/BLUE SHIELD | Admitting: Family Medicine

## 2017-09-19 VITALS — BP 118/72 | Temp 98.1°F | Ht 63.0 in | Wt 135.4 lb

## 2017-09-19 DIAGNOSIS — J329 Chronic sinusitis, unspecified: Secondary | ICD-10-CM | POA: Diagnosis not present

## 2017-09-19 MED ORDER — CEFDINIR 300 MG PO CAPS: 300.0000 mg | ORAL_CAPSULE | Freq: Two times a day (BID) | ORAL | 0 refills | Status: DC

## 2017-09-19 MED ORDER — ALBUTEROL SULFATE HFA 108 (90 BASE) MCG/ACT IN AERS: 2.0000 | INHALATION_SPRAY | Freq: Four times a day (QID) | RESPIRATORY_TRACT | 2 refills | Status: DC | PRN

## 2017-09-19 NOTE — Progress Notes (Signed)
   Subjective:    Patient ID: Angel Silva, female    DOB: 01/15/1965, 52 y.o.   MRN: [id number]  Sore Throat   This is a new problem. The current episode started in the past 7 days. Associated symptoms include coughing. Associated symptoms comments: Runny nose, tightness in chest, body aches, fatigue. Treatments tried: Claritin. The treatment provided mild relief.    Started out as a sore throat  Lots of drange got to feeling porly  Sore throat became scratchy  Fatigue and weaknes past few days  Feels heaby in the chest weakness and fatigue Felt cold and achey     Throat still   Scratchy    Review of Systems  Respiratory: Positive for cough.        Objective:   Physical Exam  Alert, mild malaise. Hydration good Vitals stable. frontal/ maxillary tenderness evident positive nasal congestion. pharynx normal neck supple  lungs clear/no crackles or wheezes. heart regular in rhythm       Assessment & Plan:  Impression rhinosinusitis likely post viral, discussed with patient. plan antibiotics prescribed. Questions answered. Symptomatic care discussed. warning signs discussed. WSL

## 2017-09-20 ENCOUNTER — Encounter: Payer: Self-pay | Admitting: Certified Nurse Midwife

## 2017-09-20 ENCOUNTER — Other Ambulatory Visit: Payer: Self-pay

## 2017-09-20 ENCOUNTER — Telehealth: Payer: Self-pay | Admitting: Obstetrics and Gynecology

## 2017-09-20 ENCOUNTER — Ambulatory Visit: Payer: BLUE CROSS/BLUE SHIELD | Admitting: Certified Nurse Midwife

## 2017-09-20 ENCOUNTER — Other Ambulatory Visit: Payer: Self-pay | Admitting: Certified Nurse Midwife

## 2017-09-20 VITALS — BP 118/80 | HR 70 | Resp 16 | Ht 62.75 in | Wt 134.0 lb

## 2017-09-20 DIAGNOSIS — N898 Other specified noninflammatory disorders of vagina: Secondary | ICD-10-CM | POA: Diagnosis not present

## 2017-09-20 DIAGNOSIS — Z1231 Encounter for screening mammogram for malignant neoplasm of breast: Secondary | ICD-10-CM

## 2017-09-20 DIAGNOSIS — R350 Frequency of micturition: Secondary | ICD-10-CM | POA: Diagnosis not present

## 2017-09-20 LAB — POCT URINALYSIS DIPSTICK
BILIRUBIN UA: NEGATIVE
Blood, UA: NEGATIVE
GLUCOSE UA: NEGATIVE
Ketones, UA: NEGATIVE
LEUKOCYTES UA: NEGATIVE
Nitrite, UA: NEGATIVE
PH UA: 5 (ref 5.0–8.0)
Protein, UA: NEGATIVE
UROBILINOGEN UA: NEGATIVE U/dL — AB

## 2017-09-20 NOTE — Telephone Encounter (Signed)
Patient thinks she has bacterial vaginosis again. Patient is asking for the cream instead of the pill.

## 2017-09-20 NOTE — Telephone Encounter (Signed)
Spoke with patient. Patient reports vaginal discomfort and irritation for the last wk. Denies vaginal odor, d/c, bleeding, urinary symptoms, N/V, fever/chills.   Patient requesting RX for BV. Advised will need OV for further evaluation, OV scheduled for today at 4pm with Leota Sauerseborah Leonard, CNM.   Patient verbalizes understanding and is agreeable. Encounter closed.

## 2017-09-20 NOTE — Progress Notes (Signed)
52 y.o. Divorced [name] [phone] here with complaint of vaginal symptoms of itching internal and external area also.Marland Kitchen. Describes discharge as normal for her. Onset of symptoms 8-9 days ago. Patient has noted some vaginal dryness. Has changed soaps and has noted some change. Some stress incontinence on occasion. Sexually active. No STD screening needed. No other concerns today. Contraception Hysterectomy.   Review of Systems  Constitutional: Negative.   HENT: Negative.   Eyes: Negative.   Respiratory: Negative.   Cardiovascular: Negative.   Gastrointestinal: Negative.   Genitourinary: Positive for frequency and urgency.       Loss of urine, vaginal itching  Musculoskeletal: Negative.   Skin: Negative.   Neurological: Negative.   Endo/Heme/Allergies: Negative.   Psychiatric/Behavioral: Negative.     O:Healthy female WDWN Affect: normal, orientation x 3  Exam:Skin: warm and dry CVAT bilateral negative Abdomen:soft, negative suprapubic, no masses Inguinal Lymph nodes: no enlargement or tenderness Pelvic exam: External genital: normal female, with redness noted, no scaling or exudate BUS: negative Urethral meatus, bladder not tender Vagina: white non odorous discharge noted.  Affirm taken Cervix: absent Uterus: absent Adnexa:normal, non tender, no masses or fullness noted   Wet Prep results: poct urine-neg   A:Normal pelvic exam Vaginal dryness R/O vaginal infection R/O UTI Urinary stress incontinence   P:Discussed findings of normal pelvic exam and vaginal dryness noted and etiology. Discussed Aveeno or baking soda sitz bath for comfort. Avoid moist clothes or pads for extended period of time. If working out in gym clothes  for long periods of time change underwear  if possible. Coconut Oil use for skin protection prior to activity can be used to external skin for protection or dryness. Lab: affirm, Urine culture Will treat if indicated. UTI warning signs  given. Encouraged to schedule aex.  Rv prn

## 2017-09-21 ENCOUNTER — Telehealth: Payer: Self-pay | Admitting: *Deleted

## 2017-09-21 LAB — VAGINITIS/VAGINOSIS, DNA PROBE
Candida Species: NEGATIVE
Gardnerella vaginalis: POSITIVE — AB
Trichomonas vaginosis: NEGATIVE

## 2017-09-21 MED ORDER — METRONIDAZOLE 0.75 % VA GEL: 1.0000 | Freq: Every day | VAGINAL | 0 refills | Status: AC

## 2017-09-21 NOTE — Telephone Encounter (Signed)
Notes recorded by Leda MinHamm, Jolayne Branson N, RN on 09/21/2017 at 8:51 AM EDT Left message to call Noreene LarssonJill at [phone].

## 2017-09-21 NOTE — Telephone Encounter (Signed)
Ok to treat with Metrogel

## 2017-09-21 NOTE — Telephone Encounter (Signed)
Spoke with patient, advised as seen below per Leota Sauerseborah Leonard, CNM. Patient requesting metrogel instead of Flagyl PO. Advised I will review with provider and return call. Pharmacy on file confirmed. Patient verbalizes understanding and is agreeable.    Leota Sauerseborah Leonard, CNM -please advise on metrogel.

## 2017-09-21 NOTE — Telephone Encounter (Signed)
-----   Message from Verner Choleborah S Leonard, CNM sent at 09/21/2017  7:43 AM EDT ----- Notify patient that her vaginal screen was negative for yeast and trichomonas, positive for BV Will need Rx Flagyl 500 mg bid x 7 Alcohol precautions Urine culture pending Feel her issues were coming from dryness and BV

## 2017-09-21 NOTE — Telephone Encounter (Signed)
Spoke with patient, advised as seen below per Leota Sauerseborah Leonard, CNM. Rx for metrogel to verified pharmacy. Patient verbalizes understanding and is agreeable. Encounter closed.

## 2017-09-22 LAB — URINE CULTURE

## 2017-09-25 ENCOUNTER — Ambulatory Visit: Payer: 59 | Admitting: Certified Nurse Midwife

## 2017-09-28 ENCOUNTER — Ambulatory Visit: Payer: BLUE CROSS/BLUE SHIELD | Admitting: Certified Nurse Midwife

## 2017-10-10 ENCOUNTER — Ambulatory Visit: Payer: BLUE CROSS/BLUE SHIELD | Admitting: Certified Nurse Midwife

## 2017-10-15 ENCOUNTER — Ambulatory Visit
Admission: RE | Admit: 2017-10-15 | Discharge: 2017-10-15 | Disposition: A | Discharge status: Home / Self Care | Payer: Managed Care, Other (non HMO) | Source: Ambulatory Visit | Attending: Certified Nurse Midwife | Admitting: Certified Nurse Midwife

## 2017-10-15 DIAGNOSIS — Z1231 Encounter for screening mammogram for malignant neoplasm of breast: Secondary | ICD-10-CM

## 2017-10-19 ENCOUNTER — Other Ambulatory Visit: Payer: Self-pay

## 2017-10-19 ENCOUNTER — Encounter: Payer: Self-pay | Admitting: Certified Nurse Midwife

## 2017-10-19 ENCOUNTER — Ambulatory Visit (INDEPENDENT_AMBULATORY_CARE_PROVIDER_SITE_OTHER): Payer: BLUE CROSS/BLUE SHIELD | Admitting: Certified Nurse Midwife

## 2017-10-19 ENCOUNTER — Encounter

## 2017-10-19 VITALS — BP 120/80 | HR 68 | Resp 16 | Ht 62.5 in | Wt 135.0 lb

## 2017-10-19 DIAGNOSIS — E559 Vitamin D deficiency, unspecified: Secondary | ICD-10-CM

## 2017-10-19 DIAGNOSIS — Z113 Encounter for screening for infections with a predominantly sexual mode of transmission: Secondary | ICD-10-CM

## 2017-10-19 DIAGNOSIS — Z0000 Encounter for general adult medical examination without abnormal findings: Secondary | ICD-10-CM

## 2017-10-19 DIAGNOSIS — Z01419 Encounter for gynecological examination (general) (routine) without abnormal findings: Secondary | ICD-10-CM | POA: Diagnosis not present

## 2017-10-19 NOTE — Progress Notes (Signed)
52 y.o. [id number] Divorced  Caucasian Fe here for annual exam. Menopausal no hot flashes or night sweats. Using coconut oil for dryness, with some change.  Denies vaginal bleeding. Has had partner change and desires STD screenings. Has new relationship and would like to make sure all are negative. Feeling better with urination issues with moisture being used. Sees PCP Dr. Gerda Diss for aex. Screening labs.  Patient's last menstrual period was 01/03/2000.          Sexually active: Yes.    The current method of family planning is status post hysterectomy.    Exercising: Yes.    walking Smoker:  no  Review of Systems  Gastrointestinal:       Spontaneous loss of urine  All other systems reviewed and are negative.   Health Maintenance: Pap:  2012 neg History of Abnormal Pap: no MMG:  10-15-17 category c density birads 1:neg Self Breast exams: no Colonoscopy:  2018 f/u 30yrs with 12 day prep BMD:   unsure TDaP:  2014 Shingles: never Pneumonia: never Hep C and HIV: HIV neg 2019, Hep c neg 2018 Labs:yes   reports that she has quit smoking. She has never used smokeless tobacco. She reports that she drinks alcohol. She reports that she does not use drugs.  Past Medical History:  Diagnosis Date  . Abnormal Pap smear of cervix    over 30 yrs ago  . Anxiety   . Chronic joint pain   . Depression   . GDM (gestational diabetes mellitus)    History  . IBS (irritable bowel syndrome)   . Reflux     Past Surgical History:  Procedure Laterality Date  . ABDOMINAL HYSTERECTOMY  2002   TAH- Fibroids , endometriosis; ov retained  . BACK SURGERY  2005   L5-S1 Fusion  . CESAREAN SECTION  1987  . ESOPHAGOGASTRODUODENOSCOPY    . LAPAROSCOPIC CHOLECYSTECTOMY  1995  . MASTOPEXY  2012  . REDUCTION MAMMAPLASTY Bilateral   . TONSILLECTOMY    . TUBAL LIGATION  1993   BTSP    Current Outpatient Medications  Medication Sig Dispense Refill  . albuterol (PROVENTIL HFA;VENTOLIN HFA) 108 (90 Base)  MCG/ACT inhaler Inhale 2 puffs into the lungs every 6 (six) hours as needed for wheezing or shortness of breath. 1 Inhaler 2  . cefdinir (OMNICEF) 300 MG capsule Take 1 capsule (300 mg total) by mouth 2 (two) times daily. 20 capsule 0  . clonazePAM (KLONOPIN) 1 MG tablet Take 1 tablet (1 mg total) by mouth daily as needed. 30 tablet 5  . loratadine (CLARITIN) 10 MG tablet Take 10 mg by mouth daily.    . Multiple Vitamin (MULTIVITAMIN) tablet Take 1 tablet by mouth daily.    . traZODone (DESYREL) 50 MG tablet TAKE 1/2 TO 1 (ONE-HALF TO ONE) TABLET BY MOUTH ONCE DAILY AT BEDTIME AS NEEDED FOR SLEEP 90 tablet 1  . Vitamin D, Ergocalciferol, (DRISDOL) 50000 units CAPS capsule Take 1 capsule (50,000 Units total) by mouth every 7 (seven) days. 4 capsule 2   No current facility-administered medications for this visit.     Family History  Problem Relation Age of Onset  . Hypertension Mother   . Diabetes Mother        Type 2  . Thyroid disease Mother   . Glaucoma Mother   . Hypertension Father   . Atrial fibrillation Father   . Cancer Father        bladder & liver  . Heart disease  Father   . Crohn's disease Father   . Heart attack Maternal Grandfather   . Diabetes Maternal Grandmother        Type 2  . Hypertension Maternal Grandmother   . Heart disease Maternal Grandmother   . Heart disease Paternal Grandmother   . CAD Maternal Uncle   . Hypertension Brother   . Melanoma Brother   . Heart murmur Brother   . Breast cancer Neg Hx     ROS:  Pertinent items are noted in HPI.  Otherwise, a comprehensive ROS was negative.  Exam:   BP 120/80   Pulse 68   Resp 16   Ht 5' 2.5" (1.588 m)   Wt 135 lb (61.2 kg)   LMP 01/03/2000   BMI 24.30 kg/m  Height: 5' 2.5" (158.8 cm) Ht Readings from Last 3 Encounters:  10/19/17 5' 2.5" (1.588 m)  09/20/17 5' 2.75" (1.594 m)  09/19/17 5\' 3"  (1.6 m)    General appearance: alert, cooperative and appears stated age Head: Normocephalic, without  obvious abnormality, atraumatic Neck: no adenopathy, supple, symmetrical, trachea midline and thyroid normal to inspection and palpation Lungs: clear to auscultation bilaterally Breasts: normal appearance, no masses or tenderness, No nipple retraction or dimpling, No nipple discharge or bleeding, No axillary or supraclavicular adenopathy Heart: regular rate and rhythm Abdomen: soft, non-tender; no masses,  no organomegaly Extremities: extremities normal, atraumatic, no cyanosis or edema Skin: Skin color, texture, turgor normal. No rashes or lesions Lymph nodes: Cervical, supraclavicular, and axillary nodes normal. No abnormal inguinal nodes palpated Neurologic: Grossly normal   Pelvic: External genitalia:  no lesions              Urethra:  normal appearing urethra with no masses, tenderness or lesions              Bartholin's and Skene's: normal                 Vagina: normal appearing vagina with normal color and discharge, no lesions              Cervix: absent              Pap taken: No. Bimanual Exam:  Uterus:  uterus absent              Adnexa: normal adnexa and no mass, fullness, tenderness               Rectovaginal: Confirms               Anus:  normal sphincter tone, no lesions  Chaperone present: yes  A:  Well Woman with normal exam  Menopausal no HRT  Atrophic vaginitis using coconut oil with good response  STD screening  Screening labs  P:   Reviewed health and wellness pertinent to exam  Aware of need to evaluate if vaginal bleeding.  Continue to use coconut oil and advise if issues.  Labs: Affirm, Gc/chlamydia, STD panel, HSV 1,2, Hep C,    Labs:Lipid panel, CMP, TSH, Vit. D  Pap smear: no   counseled on breast self exam, mammography screening, feminine hygiene, adequate intake of calcium and vitamin D, diet and exercise  return annually or prn  An After Visit Summary was printed and given to the patient.

## 2017-10-20 ENCOUNTER — Other Ambulatory Visit: Payer: Self-pay | Admitting: Certified Nurse Midwife

## 2017-10-20 DIAGNOSIS — B9689 Other specified bacterial agents as the cause of diseases classified elsewhere: Secondary | ICD-10-CM

## 2017-10-20 DIAGNOSIS — N760 Acute vaginitis: Secondary | ICD-10-CM

## 2017-10-20 LAB — VITAMIN D 25 HYDROXY (VIT D DEFICIENCY, FRACTURES): Vit D, 25-Hydroxy: 27.3 ng/mL — ABNORMAL LOW (ref 30.0–100.0)

## 2017-10-20 LAB — COMPREHENSIVE METABOLIC PANEL
A/G RATIO: 1.9 (ref 1.2–2.2)
ALK PHOS: 83 IU/L (ref 39–117)
ALT: 14 IU/L (ref 0–32)
AST: 17 IU/L (ref 0–40)
Albumin: 4.4 g/dL (ref 3.5–5.5)
BILIRUBIN TOTAL: 0.7 mg/dL (ref 0.0–1.2)
BUN / CREAT RATIO: 18 (ref 9–23)
BUN: 13 mg/dL (ref 6–24)
CO2: 24 mmol/L (ref 20–29)
Calcium: 9.5 mg/dL (ref 8.7–10.2)
Chloride: 101 mmol/L (ref 96–106)
Creatinine, Ser: 0.72 mg/dL (ref 0.57–1.00)
GFR calc Af Amer: 112 mL/min/{1.73_m2} (ref >59)
GFR calc non Af Amer: 97 mL/min/{1.73_m2} (ref >59)
GLOBULIN, TOTAL: 2.3 g/dL (ref 1.5–4.5)
Glucose: 90 mg/dL (ref 65–99)
POTASSIUM: 4.5 mmol/L (ref 3.5–5.2)
SODIUM: 139 mmol/L (ref 134–144)
Total Protein: 6.7 g/dL (ref 6.0–8.5)

## 2017-10-20 LAB — LIPID PANEL
CHOL/HDL RATIO: 2.4 ratio (ref 0.0–4.4)
Cholesterol, Total: 190 mg/dL (ref 100–199)
HDL: 78 mg/dL (ref >39)
LDL CALC: 95 mg/dL (ref 0–99)
Triglycerides: 86 mg/dL (ref 0–149)
VLDL CHOLESTEROL CAL: 17 mg/dL (ref 5–40)

## 2017-10-20 LAB — VAGINITIS/VAGINOSIS, DNA PROBE
Candida Species: NEGATIVE
GARDNERELLA VAGINALIS: POSITIVE — AB
TRICHOMONAS VAG: NEGATIVE

## 2017-10-20 LAB — HEP, RPR, HIV PANEL
HEP B S AG: NEGATIVE
HIV Screen 4th Generation wRfx: NONREACTIVE
RPR: NONREACTIVE

## 2017-10-20 LAB — HSV(HERPES SIMPLEX VRS) I + II AB-IGG
HSV 1 GLYCOPROTEIN G AB, IGG: 44.1 {index} — AB (ref 0.00–0.90)
HSV 2 IgG, Type Spec: <0.91 index (ref 0.00–0.90)

## 2017-10-20 LAB — TSH: TSH: 1.73 u[IU]/mL (ref 0.450–4.500)

## 2017-10-20 LAB — HEPATITIS C ANTIBODY: Hep C Virus Ab: <0.1 s/co ratio (ref 0.0–0.9)

## 2017-10-20 MED ORDER — METRONIDAZOLE 0.75 % VA GEL: 1.0000 | Freq: Two times a day (BID) | VAGINAL | 0 refills | Status: DC

## 2017-10-22 ENCOUNTER — Ambulatory Visit: Payer: Managed Care, Other (non HMO)

## 2017-10-23 ENCOUNTER — Telehealth: Payer: Self-pay

## 2017-10-23 LAB — GC/CHLAMYDIA PROBE AMP
CHLAMYDIA, DNA PROBE: NEGATIVE
Neisseria gonorrhoeae by PCR: NEGATIVE

## 2017-10-23 NOTE — Telephone Encounter (Signed)
-----   Message from Verner Chol, CNM sent at 10/20/2017 12:13 PM EDT ----- [name], Your vaginal screening was negative for yeast( candida) and trichomonas. It was positive for Bacterial vaginitis(gardnerella) I have sent prescription for Metrogel use one applicator twice daily vaginlly for 5 days. Discharge will increase during treatment, this is normal. Will resolve after completion on medication. Warm tub bath with baking soda will help with this. 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 [name]

## 2017-10-23 NOTE — Telephone Encounter (Signed)
Unable to leave message. Mailbox full.

## 2017-10-24 NOTE — Telephone Encounter (Signed)
Patient notified of results. See lab 

## 2017-11-01 ENCOUNTER — Other Ambulatory Visit: Payer: Self-pay

## 2017-11-01 ENCOUNTER — Encounter: Payer: Self-pay | Admitting: Certified Nurse Midwife

## 2017-11-01 ENCOUNTER — Ambulatory Visit: Payer: BLUE CROSS/BLUE SHIELD | Admitting: Certified Nurse Midwife

## 2017-11-01 ENCOUNTER — Telehealth: Payer: Self-pay | Admitting: Certified Nurse Midwife

## 2017-11-01 VITALS — BP 120/80 | HR 70 | Resp 16 | Wt 139.0 lb

## 2017-11-01 DIAGNOSIS — N898 Other specified noninflammatory disorders of vagina: Secondary | ICD-10-CM

## 2017-11-01 NOTE — Progress Notes (Signed)
52 y.o. Divorced [name] [phone] here with complaint of vaginal symptoms of itching, burning, and increase discharge. Describes discharge as milky white with cottage cheese consistency. Some odor now. Recently treated for BV with Metrogel and noted discharge increased and felt better. 3 days later discharge returned as above. No partner change or personal product change. Denies pelvic pain. No STD concerns. Urinary symptoms occasional leaking ?, but holds urine longer at times but dysuria.. No other health issues today.  Review of Systems  Constitutional: Negative.   HENT: Negative.   Eyes: Negative.   Respiratory: Negative.   Cardiovascular: Negative.   Gastrointestinal: Negative.   Genitourinary:       Vaginal itching, irritation & discharge  Musculoskeletal: Negative.   Skin: Negative.   Neurological: Negative.   Endo/Heme/Allergies: Negative.   Psychiatric/Behavioral: Negative.     O:Healthy female WDWN Affect: normal, orientation x 3  Exam:Skin:warm and dry Abdomen:soft, non tender  Inguinal Lymph nodes: no enlargement or tenderness Pelvic exam: External genital: normal female, no scaling or exudate BUS: negative Vagina: chunky white non odorous discharge noted.    , Affirm taken Cervix:absent Uterus: absent Adnexa:normal, non tender, no masses or fullness noted   A:Normal pelvic exam Recent treatment for BV R/O vaginal infection Vaginal support good   P:Discussed findings of increase vaginal discharge as she described and possible etiology of vaginal return to normal ph after treatment.  Discussed Aveeno or baking soda sitz bath for comfort and slight odor control until lab results in. Questions addressed..  Also discussed not holding urine for long periods of time which can increase leaking. Work on Kegel exercise, instructions given. Lab: affirm will treat if indicated.   Rv prn

## 2017-11-01 NOTE — Telephone Encounter (Signed)
Left message to call Rosi Secrist, RN at [facility] [phone].   

## 2017-11-01 NOTE — Telephone Encounter (Signed)
Patient returning call to Jill. °

## 2017-11-01 NOTE — Telephone Encounter (Signed)
Spoke with patient. Patient reports thick white "cottage cheese" vaginal d/c, intermittent fishy odor and vaginal itching. Symptoms started 2-3 days ago. 10/19, treated with Metrogel for BV. Patient asking if she can use OTC monistat or return to office?   Advised will review with Leota Sauers, CNM and return call, patient is agreeable.   Leota Sauers, CNM -please advise?

## 2017-11-01 NOTE — Telephone Encounter (Signed)
Patient sent the following correspondence through MyChart. Routing to triage to assist patient with request.  I have been noticing a fairly heavy discharge consistent to cottage cheese with occasional odor. Should I just get over the counter treatment?

## 2017-11-01 NOTE — Telephone Encounter (Signed)
Spoke with patient, advised as seen below. Patient request OV for today, scheduled for 1:30pm with Leota Sauers, CNM. Patient verbalizes understanding and is agreeable. Encounter closed.

## 2017-11-01 NOTE — Telephone Encounter (Signed)
Left message to call Marnae Madani, RN at [facility] [phone].   

## 2017-11-01 NOTE — Telephone Encounter (Signed)
Left message to call Noreene Larsson, RN at [facility] [phone].    Reviewed with Leota Sauers, CNM.  Offer OV, if declines. Can use OTC monistat 1 day and baking soda baths. If does not resolve, will need OV.

## 2017-11-02 ENCOUNTER — Other Ambulatory Visit: Payer: Self-pay

## 2017-11-02 LAB — VAGINITIS/VAGINOSIS, DNA PROBE
Candida Species: NEGATIVE
Gardnerella vaginalis: POSITIVE — AB
Trichomonas vaginosis: NEGATIVE

## 2017-11-02 MED ORDER — TINIDAZOLE 500 MG PO TABS: 500.0000 mg | ORAL_TABLET | Freq: Two times a day (BID) | ORAL | 0 refills | Status: DC

## 2017-11-06 ENCOUNTER — Other Ambulatory Visit: Payer: Self-pay

## 2017-11-06 ENCOUNTER — Telehealth: Payer: Self-pay | Admitting: Family Medicine

## 2017-11-06 MED ORDER — TRAZODONE HCL 50 MG PO TABS: ORAL_TABLET | ORAL | 1 refills | Status: DC

## 2017-11-06 NOTE — Telephone Encounter (Signed)
Medication sent in. 

## 2017-11-06 NOTE — Telephone Encounter (Signed)
Ok plus one mo ref 

## 2017-11-06 NOTE — Telephone Encounter (Signed)
Pharmacy requesting refill on Trazodone 50 mg. Take one half to one tablet by mouth once daily at bedtime as needed for sleep

## 2017-11-22 ENCOUNTER — Ambulatory Visit (INDEPENDENT_AMBULATORY_CARE_PROVIDER_SITE_OTHER): Payer: Self-pay | Admitting: Certified Nurse Midwife

## 2017-11-22 ENCOUNTER — Other Ambulatory Visit: Payer: Self-pay

## 2017-11-22 ENCOUNTER — Encounter: Payer: Self-pay | Admitting: Certified Nurse Midwife

## 2017-11-22 ENCOUNTER — Telehealth: Payer: Self-pay | Admitting: Certified Nurse Midwife

## 2017-11-22 VITALS — BP 120/80 | HR 70 | Temp 98.2°F | Resp 16 | Wt 135.0 lb

## 2017-11-22 DIAGNOSIS — Z113 Encounter for screening for infections with a predominantly sexual mode of transmission: Secondary | ICD-10-CM

## 2017-11-22 DIAGNOSIS — R102 Pelvic and perineal pain: Secondary | ICD-10-CM

## 2017-11-22 DIAGNOSIS — N898 Other specified noninflammatory disorders of vagina: Secondary | ICD-10-CM

## 2017-11-22 DIAGNOSIS — N390 Urinary tract infection, site not specified: Secondary | ICD-10-CM

## 2017-11-22 LAB — POCT URINALYSIS DIPSTICK
Bilirubin, UA: NEGATIVE
Glucose, UA: NEGATIVE
Ketones, UA: NEGATIVE
Nitrite, UA: NEGATIVE
Protein, UA: NEGATIVE
Urobilinogen, UA: NEGATIVE U/dL — AB
pH, UA: 5

## 2017-11-22 MED ORDER — NITROFURANTOIN MONOHYD MACRO 100 MG PO CAPS: ORAL_CAPSULE | ORAL | 0 refills | Status: DC

## 2017-11-22 NOTE — Telephone Encounter (Signed)
Patient returned call and scheduled an appointment for today at 4:00 PM with Leota Sauerseborah Leonard, CNM.

## 2017-11-22 NOTE — Patient Instructions (Signed)

## 2017-11-22 NOTE — Progress Notes (Signed)
52 y.o. Divorced [name] [phone] here with complaint of vaginal symptoms of irritation and burning with urination. Last sexual activity. Describes discharge as scant to none with odor. Patient noted burning with urination today and back pain. Some urinary frequency ? New partner and has been sexually active several times in past 3-4 days Onset of symptoms last 2-3 days ago. Denies new personal products or vaginal dryness. No STD concerns but would like Gc/Chlamydia screening.  Contraception is hysterectomy.  Review of Systems  Constitutional: Negative for chills, fever and malaise/fatigue.  HENT: Negative.   Eyes: Negative.   Respiratory: Negative.   Cardiovascular: Negative.   Gastrointestinal: Negative.   Genitourinary: Positive for dysuria, flank pain and frequency. Negative for hematuria.  Skin: Negative.   Neurological: Negative.   Endo/Heme/Allergies: Negative.   Psychiatric/Behavioral: Negative.     O:Healthy female WDWN Affect: normal, orientation x 3  Exam:Skin : warm and dry CVAT: slight positive on left Abdomen:soft, no masses, positive for suprapubic tenderness  Inguinal Lymph nodes: no enlargement or tenderness Pelvic exam: External genital: normal female, slight increase pink around vulva only, no scaling or exudate Urethra: tender Bladder, urethral meatus tender Vagina: scant white ? odorous discharge noted. Affirm taken Cervix:Surgically absent Uterus: absent Adnexa: masses or fullness noted  poct urine-wbc 2+, rbc tr  A:Normal pelvic exam Suspect post coital UTI R/O vaginal infection STD screening   P:Discussed findings of normal pelvic exam. Discussed symptoms positive for UTI and probable post coital and etiology discussed. Discussed Aveeno or baking soda sitz bath for comfort for vaginal symptoms.  Rx Macrobid see order with instructions. Lab: Urine micro Warning signs of UTI given and need to increase water intake. May use AZO OTC for comfort  tid  No longer that 2 days. If symptoms persist need to advise. Consider postcoital treatment. Questions addressed. Lab: Affirm will treat for vaginitis if indicated. Lab: GC/Chlamydia   Rv prn

## 2017-11-22 NOTE — Telephone Encounter (Signed)
Patient sent the following correspondence through MyChart. I called the patient and left a message to call our office directly for scheduling. Routing to triage to assist patient with request further, if needed.  Ive had a fishy odor for a few days now and have pretty bad lower back & lower abdominal pain. I havent noticed any discharge but a little irritation occasionally.I currently dont have insurance. It will kick in on Dec 1st. Can something be called in or should I be seen?

## 2017-11-23 ENCOUNTER — Other Ambulatory Visit: Payer: Self-pay | Admitting: Certified Nurse Midwife

## 2017-11-23 ENCOUNTER — Telehealth: Payer: Self-pay | Admitting: Certified Nurse Midwife

## 2017-11-23 DIAGNOSIS — B9689 Other specified bacterial agents as the cause of diseases classified elsewhere: Secondary | ICD-10-CM

## 2017-11-23 DIAGNOSIS — N760 Acute vaginitis: Secondary | ICD-10-CM

## 2017-11-23 LAB — VAGINITIS/VAGINOSIS, DNA PROBE
Candida Species: NEGATIVE
GARDNERELLA VAGINALIS: POSITIVE — AB
Trichomonas vaginosis: NEGATIVE

## 2017-11-23 LAB — GC/CHLAMYDIA PROBE AMP
Chlamydia trachomatis, NAA: NEGATIVE
Neisseria gonorrhoeae by PCR: NEGATIVE

## 2017-11-23 MED ORDER — METRONIDAZOLE 0.75 % VA GEL: VAGINAL | 0 refills | Status: DC

## 2017-11-23 NOTE — Telephone Encounter (Signed)
Patient has had partner changes also, which can contribute to this. Using condoms does help with this. Also we can discuss chronic treatment if needed, once this infection has cleared.

## 2017-11-23 NOTE — Telephone Encounter (Signed)
Patient sent the following correspondence through MyChart. Routing to triage to assist patient with request.  Im just concerned because I continue to get BV. Every time I finish the medication prescribed I quickly develop it again. I dont understand why. What can I do to avoid from getting it? I think this makes the 4th time in 4 months.

## 2017-11-23 NOTE — Addendum Note (Signed)
Addended by: Verner CholLEONARD, Nakhia Levitan S on: 11/23/2017 08:38 AM   Modules accepted: Orders

## 2017-11-23 NOTE — Telephone Encounter (Signed)
Angel Silva,  Routed this message to you via mychart.

## 2017-11-24 LAB — URINALYSIS, MICROSCOPIC ONLY
CASTS: NONE SEEN /LPF
WBC, UA: >30 /hpf — AB (ref 0–5)

## 2017-11-26 ENCOUNTER — Encounter: Payer: Self-pay | Admitting: Certified Nurse Midwife

## 2017-11-26 ENCOUNTER — Telehealth: Payer: Self-pay

## 2017-11-26 NOTE — Telephone Encounter (Signed)
Left message for call back.

## 2017-11-26 NOTE — Telephone Encounter (Signed)
No voicemail. Try again. 

## 2017-11-26 NOTE — Telephone Encounter (Signed)
-----   Message from Verner Choleborah S Leonard, CNM sent at 11/23/2017  4:48 PM EST ----- Angel MerlHi Angel Silva, Your gonorrhea and chlamydia screening are negative. Frequent BV can occur more often with partner change. Would like you to come in 2 weeks out and make sure all clear.  Please call to schedule [name]

## 2017-11-27 ENCOUNTER — Encounter: Payer: Self-pay | Admitting: Certified Nurse Midwife

## 2017-11-27 NOTE — Telephone Encounter (Signed)
Patient notified of results. See lab 

## 2017-12-07 ENCOUNTER — Encounter: Payer: Self-pay | Admitting: Certified Nurse Midwife

## 2017-12-07 ENCOUNTER — Other Ambulatory Visit: Payer: Self-pay | Admitting: Family Medicine

## 2017-12-07 ENCOUNTER — Other Ambulatory Visit: Payer: Self-pay

## 2017-12-07 ENCOUNTER — Ambulatory Visit: Payer: BLUE CROSS/BLUE SHIELD | Admitting: Certified Nurse Midwife

## 2017-12-07 VITALS — BP 106/64 | HR 68 | Resp 16 | Wt 137.0 lb

## 2017-12-07 DIAGNOSIS — N898 Other specified noninflammatory disorders of vagina: Secondary | ICD-10-CM

## 2017-12-07 MED ORDER — CLONAZEPAM 1 MG PO TABS: 1.0000 mg | ORAL_TABLET | Freq: Every day | ORAL | 0 refills | Status: DC | PRN

## 2017-12-07 MED ORDER — TRAZODONE HCL 50 MG PO TABS: ORAL_TABLET | ORAL | 0 refills | Status: DC

## 2017-12-07 NOTE — Telephone Encounter (Signed)
May have 1 refill of each follow-up with Dr. Brett CanalesSteve as planned

## 2017-12-07 NOTE — Telephone Encounter (Signed)
Rx's printed awaiting Dr.Signature.

## 2017-12-07 NOTE — Progress Notes (Signed)
52 y.o. Divorced [name] [phone] here with complaint of vaginal symptoms of slight odor and slight irritation today. No discharge noted now. No sexually activity since last visit on 11/22/17.Completed medication for UTI and BV.  Denies new personal products or vaginal dryness. Concerned that she may just be back to normal. no STD concerns. Urinary symptoms resolved with medication use. No concerns with frequency or pain. . Contraception Menopausal, hysterectomy..   Review of Systems  Constitutional: Negative.   HENT: Negative.   Eyes: Negative.   Respiratory: Negative.   Cardiovascular: Negative.   Gastrointestinal: Negative.   Genitourinary:       Vaginal itching  Musculoskeletal: Negative.   Skin: Negative.   Neurological: Negative.   Endo/Heme/Allergies: Negative.   Psychiatric/Behavioral: Negative.     O:Healthy female WDWN Affect: normal, orientation x 3  Exam:skin : warm and dry Abdomen:soft, non tender, no masses  Inguinal Lymph nodes: no enlargement or tenderness Pelvic exam: External genital: normal female, no redness, scaling or exudate BUS: negative Vagina: scant non odorous white discharge noted.  Normal appearance.  Affirm taken Cervix and Uterus surgically absent Adnexa: non tender, no masses or fullness noted Rectal area: no redness or scaling or irritation noted   A:Normal pelvic exam History of BV with reinfection with partner change, treated with Flagyl and Metrogel, appears resolved and symptom free STD screening negative UTI resolved   P:Discussed findings of normal pelvic exam. Discussed BV and yeast normal flora in vagina and with partner change, ph balance can occur with vaginal symptoms as she had. . Discussed Aveeno or baking soda sitz bath works well  for comfort and occasional irritation. Questions addressed Lab: Affirm treat if indicated  Rv prn

## 2017-12-07 NOTE — Telephone Encounter (Signed)
Pt had follow up scheduled for next week with Dr. Brett CanalesSteve. She has rescheduled that appt to 12/28/17. She needing a refill on clonazePAM (KLONOPIN) 1 MG tablet and traZODone (DESYREL) 50 MG tablet. [facility] to [facility] - [place], [place] - [address]   Pt is aware Dr. Brett CanalesSteve is out of office today and will address when he gets back next week.   CB# [phone]

## 2017-12-07 NOTE — Telephone Encounter (Signed)
Please advise 

## 2017-12-07 NOTE — Telephone Encounter (Signed)
Patient is aware of all. 

## 2017-12-08 LAB — VAGINITIS/VAGINOSIS, DNA PROBE
Candida Species: NEGATIVE
Gardnerella vaginalis: POSITIVE — AB
TRICHOMONAS VAG: NEGATIVE

## 2017-12-11 ENCOUNTER — Telehealth: Payer: Self-pay | Admitting: Emergency Medicine

## 2017-12-11 MED ORDER — NONFORMULARY OR COMPOUNDED ITEM: 0 refills | Status: DC

## 2017-12-11 MED ORDER — METRONIDAZOLE 500 MG PO TABS: 500.0000 mg | ORAL_TABLET | Freq: Two times a day (BID) | ORAL | 0 refills | Status: DC

## 2017-12-11 NOTE — Telephone Encounter (Signed)
Message left to return call to [facility] at [phone].    Needs order for PO treatment.  Needs Order for 21 days of Boric Acid to [facility].  Then office visit in 3 weeks for test of cure/LFT's.

## 2017-12-11 NOTE — Telephone Encounter (Signed)
Patient returned call.  She is given results and instructions from Leota Sauerseborah Leonard CNM. [facility]  Pt aware of instructions and to avoid any ETOH with use.  [facility]  Patient will pick up at her convenience.  Knows to start Flagyl and Boric acid on the same day.  She will call back for follow up appointment to schedule test of cure for bacterial vaginosis and LFT's.  Instructions discussed and patient verbalizes understanding.  Encounter closed.

## 2017-12-11 NOTE — Telephone Encounter (Signed)
-----   Message from Verner Choleborah S Leonard, CNM sent at 12/11/2017  7:48 AM EST ----- Notify patient her vaginal screen was positive for BV again and per UTD should be treated with Flagyl or Tindamax 500 mg bid x 7 days, then vaginal Boric Acid 600 mg in gel capsule Vaginally for 21 days, should have OV 1-2 days after last Boric acid, then can do suppressive therapy with Metrogel twice weekly for 4-6 months. She needs to have [facility] make sure no issues with long term use.

## 2017-12-12 ENCOUNTER — Telehealth: Payer: Self-pay | Admitting: Certified Nurse Midwife

## 2017-12-12 ENCOUNTER — Ambulatory Visit: Payer: BLUE CROSS/BLUE SHIELD | Admitting: Family Medicine

## 2017-12-12 NOTE — Telephone Encounter (Signed)
Left message to call Raphael Espe, RN at [facility] [phone].   

## 2017-12-12 NOTE — Telephone Encounter (Signed)
Per up to date approximately only  25.5 percent had recurrent BV. Per my experience works well.

## 2017-12-12 NOTE — Telephone Encounter (Signed)
Spoke with patient, advised as seen below per Leota Sauerseborah Leonard, CNM.   Patient will start boric acid vag supp on 12/12, complete on 1/1. OV scheduled for 01/04/18 at 12:45pm with Leota Sauerseborah Leonard, CNM.   Patient asking if limitations on intercourse? Recommended patient abstain from intercourse during treatment of BV. Patient asking if toys for external stimulation ok? Advised ok for external use, reviewed peri care and hygiene.    Routing to provider for final review. Patient is agreeable to disposition. Will close encounter.

## 2017-12-12 NOTE — Telephone Encounter (Signed)
Angel Silva, CNM -please advise on success rate. Patient being treated with boric acid vaginal suppositories and flagyl.

## 2017-12-12 NOTE — Telephone Encounter (Signed)
Patient sent the following correspondence through MyChart. Routing to triage to assist patient with request.  Hi Debbie.     As far as the treatment for my BV, I just wanted to be sure & ask a couple of specific questions.  1) As far as the intimacy with my boyfriend goes what exactly is allowed & not allowed. Please be specific.  2) What is the success rate of this treatment?

## 2017-12-14 ENCOUNTER — Telehealth: Payer: Self-pay | Admitting: Certified Nurse Midwife

## 2017-12-14 MED ORDER — NITROFURANTOIN MONOHYD MACRO 100 MG PO CAPS: ORAL_CAPSULE | ORAL | 0 refills | Status: DC

## 2017-12-14 NOTE — Telephone Encounter (Signed)
Reviewed with Leota Sauerseborah Leonard, CNM. OK for post coital Macrobid 100 mg, take 1 tab PO after intercourse, may repeat in 12 hrs if symptoms still present. #30/0RF.  Needs to increase water intake. If symptoms increase will need to be seen.   Call returned to patient, advised as seen below per Leota Sauerseborah Leonard, CNM. Rx to verified pharmacy.  Patient verbalizes understanding and is agreeable. Encounter closed.

## 2017-12-14 NOTE — Telephone Encounter (Signed)
Spoke with patient. Patient reports lower back pain and difficulty emptying bladder "sometimes". Output is a little less than normal. Symptoms started 12/12. Denies fever/chills, N/V.   Started flagyl PO and boric acid vaginal suppositories on 12/12.   Tx for UTI on 11/21 with macrobid 100mg  bid x 7days. Patient states discussed Rx for postcoital UTI. Patient requesting Rx for UTI symptoms, declines OV.   Advised I will review with Leota Sauerseborah Leonard, CNM and return call with recommendations. Patient agreeable.   Leota Sauerseborah Leonard, CNM -please advise.

## 2017-12-14 NOTE — Telephone Encounter (Signed)
Patient says she is having uti symptoms and would like something called in if possible. She is already being treated for bv and can not come in for an appointment today.

## 2017-12-28 ENCOUNTER — Ambulatory Visit: Payer: BLUE CROSS/BLUE SHIELD | Admitting: Family Medicine

## 2018-01-04 ENCOUNTER — Encounter: Payer: Self-pay | Admitting: Family Medicine

## 2018-01-04 ENCOUNTER — Ambulatory Visit (INDEPENDENT_AMBULATORY_CARE_PROVIDER_SITE_OTHER): Payer: BLUE CROSS/BLUE SHIELD | Admitting: Certified Nurse Midwife

## 2018-01-04 ENCOUNTER — Encounter: Payer: Self-pay | Admitting: Certified Nurse Midwife

## 2018-01-04 ENCOUNTER — Other Ambulatory Visit: Payer: Self-pay

## 2018-01-04 ENCOUNTER — Ambulatory Visit: Payer: BLUE CROSS/BLUE SHIELD | Admitting: Family Medicine

## 2018-01-04 VITALS — BP 118/72 | Wt 134.8 lb

## 2018-01-04 VITALS — BP 110/64 | HR 68 | Resp 16 | Wt 135.0 lb

## 2018-01-04 DIAGNOSIS — F321 Major depressive disorder, single episode, moderate: Secondary | ICD-10-CM | POA: Diagnosis not present

## 2018-01-04 DIAGNOSIS — F411 Generalized anxiety disorder: Secondary | ICD-10-CM

## 2018-01-04 DIAGNOSIS — Z01419 Encounter for gynecological examination (general) (routine) without abnormal findings: Secondary | ICD-10-CM

## 2018-01-04 DIAGNOSIS — F5101 Primary insomnia: Secondary | ICD-10-CM

## 2018-01-04 DIAGNOSIS — N761 Subacute and chronic vaginitis: Secondary | ICD-10-CM

## 2018-01-04 MED ORDER — CLONAZEPAM 0.5 MG PO TABS: ORAL_TABLET | ORAL | 0 refills | Status: DC

## 2018-01-04 MED ORDER — TRAZODONE HCL 50 MG PO TABS: ORAL_TABLET | ORAL | 5 refills | Status: DC

## 2018-01-04 NOTE — Progress Notes (Signed)
Review of Systems  Constitutional: Negative.   HENT: Negative.   Eyes: Negative.   Cardiovascular: Negative.   Gastrointestinal: Negative.   Genitourinary:       No abnormal discharge or odor vaginal  Musculoskeletal: Negative.   Skin: Negative.   Neurological: Negative.   Endo/Heme/Allergies: Negative.   Psychiatric/Behavioral: Negative.     53 y.o. Divorced [name] [phone] here for follow up of persistent BV treated with  Flagyl and Metrogel and with Boric Acid capsules for maintenance. Patient feels all is clear and no increase in discharge or odor. Has been sexually active with good partner now. Completed all medication as directed with last day of Boric acid two days ago. Using Macrobid for post coital UTI prevention with good results.  Denies any symptoms of vaginal discharge or odor or vaginal burning. Hopeful all resolved! Using vaginal moisture with sexual activity with good results. No other health issues today.    O: [name],[name] Affect: normal orientation x 3 Skin:warm and dry Abdomen:soft, nontender, normal bowel sounds Pelvic exam:EXTERNAL GENITALIA: normal appearing vulva with no masses, tenderness or lesions VAGINA: no abnormal discharge or lesions, wet prep taken.    Wet Prep/KOH no pathogens and pH 4.0  CERVIX: surgically absent UTERUS: absent ADNEXA: no masses or tenderness   A:BV Resolved  Normal pelvic exam History of post coital UTI, Macrobid working well, has RX.  P: Discussed findings of normal pelvic exam and no vaginal pathogens noted. Discussed Boric Acid capsule use if has change in vaginal discharge as before to help with prevention. Stressed vaginal moisture also is important. Patient will call if other issues.     RV prn

## 2018-01-04 NOTE — Progress Notes (Signed)
   Subjective:    Patient ID: Angel Silva, female    DOB: 06/18/1965, 53 y.o.   MRN: [id number]  Depression         This is a chronic problem.  Treatments tried: Trazodone and Klonopin.  Patient notes ongoing compliance with antidepressant medication. No obvious side effects. Reports does not miss a dose. Overall continues to help depression substantially. No thoughts of homicide or suicide. Would like to maintain medication.  Patient compliant with insomnia medication. Generally takes most nights. No obvious morning drowsiness. Definitely helps patient sleep. Without it patient states would not get a good nights rest.  Pt notes not exrcising reg these days//now has started working out more   Some ogoing stress  Working at [employer], not a dream job,  doeing first shift Anxiety    Review of Systems  Psychiatric/Behavioral: Positive for depression.       Objective:   Physical Exam   Alert vitals stable, NAD. Blood pressure good on repeat. HEENT normal. Lungs clear. Heart regular rate and rhythm.     Assessment & Plan:  Impression depression with element of anxiety.  Patient's sons girlfriend died tragically in plane accident earlier this year which is added to stress.  Would like to try to wean off Klonopin and eventually the trazodone.  Weaning regimen given for Klonopin.  Patient has been on meds for many years so I am not sure how this will work out.  Discussed

## 2018-03-08 ENCOUNTER — Telehealth: Payer: Self-pay | Admitting: Family Medicine

## 2018-03-08 NOTE — Telephone Encounter (Signed)
Pt would like to go back to original dose of clonazePAM (KLONOPIN)   Please send to [id number] - [place], [place] - [address]

## 2018-03-08 NOTE — Telephone Encounter (Signed)
Pt was taking 1 mg with direction of 1 every 6 hours as needed for anxiety. Pt is now taking 0.5 mg tablets with directions take 1.5 tablets at bedtime from 30 days; then one tablet at bedtime for 30 days and then take one half at bedtime for 30 days. Left message for patient to return call to get more info on why she would like to go back to original dose.

## 2018-03-11 NOTE — Telephone Encounter (Signed)
Pt states it was her idea to come off meds; pt states that if she goes lower that 1.5 tablet she does not get to sleep and she experiences headaches. Please advise. Thank you

## 2018-03-12 ENCOUNTER — Other Ambulatory Visit: Payer: Self-pay

## 2018-03-12 MED ORDER — CLONAZEPAM 1 MG PO TABS: 1.0000 mg | ORAL_TABLET | Freq: Every day | ORAL | 5 refills | Status: DC | PRN

## 2018-03-12 NOTE — Telephone Encounter (Signed)
Script printed and awaiting signature. Will call medication in when script is signed due to fax machine being down. Pt contacted and verbalized understanding.

## 2018-03-12 NOTE — Telephone Encounter (Signed)
Ok may return to [place] dose with five ref

## 2018-03-27 ENCOUNTER — Other Ambulatory Visit: Payer: Self-pay

## 2018-03-27 ENCOUNTER — Encounter: Payer: Self-pay | Admitting: Obstetrics and Gynecology

## 2018-03-27 ENCOUNTER — Other Ambulatory Visit: Payer: Self-pay | Admitting: Obstetrics and Gynecology

## 2018-03-27 ENCOUNTER — Ambulatory Visit: Payer: BLUE CROSS/BLUE SHIELD

## 2018-03-27 ENCOUNTER — Other Ambulatory Visit: Payer: Self-pay | Admitting: *Deleted

## 2018-03-27 ENCOUNTER — Ambulatory Visit
Admission: RE | Admit: 2018-03-27 | Discharge: 2018-03-27 | Disposition: A | Discharge status: Home / Self Care | Payer: BLUE CROSS/BLUE SHIELD | Source: Ambulatory Visit | Attending: Obstetrics and Gynecology | Admitting: Obstetrics and Gynecology

## 2018-03-27 ENCOUNTER — Ambulatory Visit (INDEPENDENT_AMBULATORY_CARE_PROVIDER_SITE_OTHER): Payer: BLUE CROSS/BLUE SHIELD | Admitting: Obstetrics and Gynecology

## 2018-03-27 ENCOUNTER — Telehealth: Payer: Self-pay | Admitting: Certified Nurse Midwife

## 2018-03-27 VITALS — BP 118/80 | HR 70 | Temp 98.1°F | Resp 16 | Wt 136.0 lb

## 2018-03-27 DIAGNOSIS — N611 Abscess of the breast and nipple: Secondary | ICD-10-CM

## 2018-03-27 DIAGNOSIS — N6310 Unspecified lump in the right breast, unspecified quadrant: Secondary | ICD-10-CM

## 2018-03-27 DIAGNOSIS — N63 Unspecified lump in unspecified breast: Secondary | ICD-10-CM | POA: Diagnosis not present

## 2018-03-27 MED ORDER — IBUPROFEN 800 MG PO TABS: 800.0000 mg | ORAL_TABLET | Freq: Three times a day (TID) | ORAL | 1 refills | Status: DC | PRN

## 2018-03-27 MED ORDER — SULFAMETHOXAZOLE-TRIMETHOPRIM 800-160 MG PO TABS: 1.0000 | ORAL_TABLET | Freq: Two times a day (BID) | ORAL | 0 refills | Status: DC

## 2018-03-27 NOTE — Telephone Encounter (Signed)
Patient is calling regarding a lump and rash on her right breast and under her right arm. Patient stated that it is hot to touch, painful and is experiencing nipple discharge. Patient stated that the lump is "about the size of a golf ball." Patient stated that she is recently got her nipples pierced in January, and believed it was an infection from the piercing, but no longer believes that to be the case.

## 2018-03-27 NOTE — Progress Notes (Signed)
GYNECOLOGY  VISIT   HPI: 53 y.o.   Divorced White or Caucasian Not Hispanic or Latino  female   [phone] with Patient's last menstrual period was 01/03/2000.   here for rt breast pain/mass/rash.   She had her nipples pierced in 1/20. Nipples have been sensitive off and on since then.  She c/o a 3 day h/o right breast pain and tenderness. She feels a golf ball sized very tender lump in her right breast. She has a rash under her breast and also in her right axilla.  No fevers, no body aches or pains.  She has slight discharge from both nipples since the piercing's. Comes and goes, no change.   Last mammogram was in 10/19, negative.   GYNECOLOGIC HISTORY: Patient's last menstrual period was 01/03/2000. Contraception:hysterectomy Menopausal hormone therapy: none        OB History    Gravida  5   Para  3   Term  1   Preterm  2   AB  2   Living  3     SAB      TAB  1   Ectopic      Multiple      Live Births  3              Patient Active Problem List   Diagnosis Date Noted  . Tendinitis of both rotator cuffs 01/25/2017  . Generalized anxiety disorder 12/29/2014  . Chronic left shoulder pain 07/11/2013  . Insomnia 04/11/2013  . Atypical chest pain 01/17/2013  . Dyspnea 01/03/2013  . Esophageal reflux 05/30/2012  . Depression 05/30/2012  . Chronic cough 05/30/2012    Past Medical History:  Diagnosis Date  . Abnormal Pap smear of cervix    over 30 yrs ago  . Anxiety   . Chronic joint pain   . Depression   . GDM (gestational diabetes mellitus)    History  . IBS (irritable bowel syndrome)   . Reflux     Past Surgical History:  Procedure Laterality Date  . ABDOMINAL HYSTERECTOMY  2002   TAH- Fibroids , endometriosis; ov retained  . BACK SURGERY  2005   L5-S1 Fusion  . CESAREAN SECTION  1987  . ESOPHAGOGASTRODUODENOSCOPY    . LAPAROSCOPIC CHOLECYSTECTOMY  1995  . MASTOPEXY  2012  . REDUCTION MAMMAPLASTY Bilateral   . TONSILLECTOMY    . TUBAL  LIGATION  1993   BTSP    Current Outpatient Medications  Medication Sig Dispense Refill  . albuterol (PROVENTIL HFA;VENTOLIN HFA) 108 (90 Base) MCG/ACT inhaler Inhale 2 puffs into the lungs every 6 (six) hours as needed for wheezing or shortness of breath. 1 Inhaler 2  . Boswellia-Glucosamine-Vit D (OSTEO BI-FLEX ONE PER DAY PO) Take by mouth.    . clonazePAM (KLONOPIN) 1 MG tablet Take 1 tablet (1 mg total) by mouth daily as needed. 30 tablet 5  . loratadine (CLARITIN) 10 MG tablet Take 10 mg by mouth daily.    . Multiple Vitamin (MULTIVITAMIN) tablet Take 1 tablet by mouth daily.    . nitrofurantoin, macrocrystal-monohydrate, (MACROBID) 100 MG capsule Take 1 tab PO after intercourse, may repeat in 12 hrs if symptoms still present. 30 capsule 0  . Probiotic Product (PROBIOTIC PO) Take by mouth.    . traZODone (DESYREL) 50 MG tablet Take one to two at bedtime as needed for sleep 45 tablet 5   No current facility-administered medications for this visit.      ALLERGIES: Effexor [venlafaxine hcl]  and Zoloft [sertraline hcl]  Family History  Problem Relation Age of Onset  . Hypertension Mother   . Diabetes Mother        Type 2  . Thyroid disease Mother   . Glaucoma Mother   . Hypertension Father   . Atrial fibrillation Father   . Cancer Father        bladder & liver  . Heart disease Father   . Crohn's disease Father   . Heart attack Maternal Grandfather   . Diabetes Maternal Grandmother        Type 2  . Hypertension Maternal Grandmother   . Heart disease Maternal Grandmother   . Heart disease Paternal Grandmother   . CAD Maternal Uncle   . Hypertension Brother   . Melanoma Brother   . Heart murmur Brother   . Other Brother        leaky aortic valve  . Breast cancer Neg Hx     Social History   Socioeconomic History  . Marital status: Divorced    Spouse name: Not on file  . Number of children: Not on file  . Years of education: Not on file  . Highest education level:  Not on file  Occupational History  . Not on file  Social Needs  . Financial resource strain: Not on file  . Food insecurity:    Worry: Not on file    Inability: Not on file  . Transportation needs:    Medical: Not on file    Non-medical: Not on file  Tobacco Use  . Smoking status: Former Games developer  . Smokeless tobacco: Never Used  Substance and Sexual Activity  . Alcohol use: Yes    Alcohol/week: 0.0 - 3.0 standard drinks  . Drug use: No  . Sexual activity: Yes    Partners: Male    Birth control/protection: Surgical    Comment: TAH  Lifestyle  . Physical activity:    Days per week: Not on file    Minutes per session: Not on file  . Stress: Not on file  Relationships  . Social connections:    Talks on phone: Not on file    Gets together: Not on file    Attends religious service: Not on file    Active member of club or organization: Not on file    Attends meetings of clubs or organizations: Not on file    Relationship status: Not on file  . Intimate partner violence:    Fear of current or ex partner: Not on file    Emotionally abused: Not on file    Physically abused: Not on file    Forced sexual activity: Not on file  Other Topics Concern  . Not on file  Social History Narrative  . Not on file    Review of Systems  Constitutional: Negative.   HENT: Negative.   Eyes: Negative.   Respiratory: Negative.   Cardiovascular: Negative.   Gastrointestinal: Negative.   Genitourinary: Negative.   Musculoskeletal: Negative.   Skin:       Right breast pain/mass, rash  Neurological: Negative.   Endo/Heme/Allergies: Negative.   Psychiatric/Behavioral: Negative.     PHYSICAL EXAMINATION:    BP 118/80   Pulse 70   Temp 98.1 F (36.7 C) (Oral)   Resp 16   Wt 136 lb (61.7 kg)   LMP 01/03/2000   BMI 24.48 kg/m     General appearance: alert, cooperative and appears stated age Breasts: in the right breast  in the upper outer quadrant is an ~ 3 x 3 cm tender smooth  breast lump with mild surrounding erythema.  Bilateral nipple rings, no nipple erythema. Evidence of bilateral breast reduction.  No axillary adenopathy, some tenderness and mild erythema.                ASSESSMENT Right breast lump. Given presentation and exam c/w breast abscess    PLAN Start bactrim DS x 10 days Ibuprofen for pain Referral to radiology for imaging and likely drainage.    An After Visit Summary was printed and given to the patient.

## 2018-03-27 NOTE — Telephone Encounter (Signed)
Spoke with patient. Patient states that 3 days ago she noticed a lump in her right breast that is the size of a golf ball, hot to the touch, and painful. Patient had her nipples pierced in January and is noticing discharge from her nipple, but reports this has been occurring since she got the piercing done. Denies any fever or chills. Advised will need to be seen in the office for further evaluation. Patient is agreeable. Appointment scheduled for today at 11:30 am with Dr.Jertson. Patient is agreeable to date and time. Encounter closed.

## 2018-03-28 ENCOUNTER — Telehealth: Payer: Self-pay | Admitting: Obstetrics and Gynecology

## 2018-03-28 NOTE — Telephone Encounter (Signed)
Patient returned my call. She started antibiotic and ibuprofen yesterday for a suspected breast abscess. She is feeling so much better. The lump feels less prominent.  She has an appointment for an ultrasound guided biopsy tomorrow. F/U here next week.

## 2018-03-29 ENCOUNTER — Ambulatory Visit
Admission: RE | Admit: 2018-03-29 | Discharge: 2018-03-29 | Disposition: A | Discharge status: Home / Self Care | Payer: BLUE CROSS/BLUE SHIELD | Source: Ambulatory Visit | Attending: Obstetrics and Gynecology | Admitting: Obstetrics and Gynecology

## 2018-03-29 ENCOUNTER — Other Ambulatory Visit: Payer: Self-pay | Admitting: Obstetrics and Gynecology

## 2018-03-29 ENCOUNTER — Other Ambulatory Visit: Payer: Self-pay

## 2018-03-29 DIAGNOSIS — N6310 Unspecified lump in the right breast, unspecified quadrant: Secondary | ICD-10-CM

## 2018-04-03 ENCOUNTER — Other Ambulatory Visit: Payer: Self-pay

## 2018-04-03 ENCOUNTER — Encounter: Payer: Self-pay | Admitting: Obstetrics and Gynecology

## 2018-04-03 ENCOUNTER — Ambulatory Visit: Payer: BLUE CROSS/BLUE SHIELD | Admitting: Obstetrics and Gynecology

## 2018-04-03 VITALS — BP 118/80 | HR 68 | Temp 98.0°F | Resp 16 | Wt 142.0 lb

## 2018-04-03 DIAGNOSIS — N611 Abscess of the breast and nipple: Secondary | ICD-10-CM

## 2018-04-03 MED ORDER — METRONIDAZOLE 500 MG PO TABS: 500.0000 mg | ORAL_TABLET | Freq: Two times a day (BID) | ORAL | 0 refills | Status: DC

## 2018-04-03 MED ORDER — CLINDAMYCIN HCL 300 MG PO CAPS: 300.0000 mg | ORAL_CAPSULE | Freq: Three times a day (TID) | ORAL | 0 refills | Status: DC

## 2018-04-03 NOTE — Progress Notes (Signed)
GYNECOLOGY  VISIT   HPI: 53 y.o.   Divorced White or Caucasian Not Hispanic or Latino  female   [phone] with Patient's last menstrual period was 01/03/2000.   here for breast recheck. She was seen last week with a tender breast mass with associated erythema. She was started on antibiotics for suspected breast abscess. Imaging showed a concerning mass, no clear abscess. Biopsy was c/w an early abscess.  She is feeling better, still slightly tender, pain is better. Thinks it has gotten a little smaller. No fevers, doesn't feel sick. Still has some erythema and bruising from the biopsy.  GYNECOLOGIC HISTORY: Patient's last menstrual period was 01/03/2000. Contraception:hysterectomy Menopausal hormone therapy: none        OB History    Gravida  5   Para  3   Term  1   Preterm  2   AB  2   Living  3     SAB      TAB  1   Ectopic      Multiple      Live Births  3              Patient Active Problem List   Diagnosis Date Noted  . Tendinitis of both rotator cuffs 01/25/2017  . Generalized anxiety disorder 12/29/2014  . Chronic left shoulder pain 07/11/2013  . Insomnia 04/11/2013  . Atypical chest pain 01/17/2013  . Dyspnea 01/03/2013  . Esophageal reflux 05/30/2012  . Depression 05/30/2012  . Chronic cough 05/30/2012    Past Medical History:  Diagnosis Date  . Abnormal Pap smear of cervix    over 30 yrs ago  . Anxiety   . Chronic joint pain   . Depression   . GDM (gestational diabetes mellitus)    History  . IBS (irritable bowel syndrome)   . Reflux     Past Surgical History:  Procedure Laterality Date  . ABDOMINAL HYSTERECTOMY  2002   TAH- Fibroids , endometriosis; ov retained  . BACK SURGERY  2005   L5-S1 Fusion  . CESAREAN SECTION  1987  . ESOPHAGOGASTRODUODENOSCOPY    . LAPAROSCOPIC CHOLECYSTECTOMY  1995  . MASTOPEXY  2012  . REDUCTION MAMMAPLASTY Bilateral   . TONSILLECTOMY    . TUBAL LIGATION  1993   BTSP    Current Outpatient  Medications  Medication Sig Dispense Refill  . albuterol (PROVENTIL HFA;VENTOLIN HFA) 108 (90 Base) MCG/ACT inhaler Inhale 2 puffs into the lungs every 6 (six) hours as needed for wheezing or shortness of breath. 1 Inhaler 2  . Boswellia-Glucosamine-Vit D (OSTEO BI-FLEX ONE PER DAY PO) Take by mouth.    . clonazePAM (KLONOPIN) 1 MG tablet Take 1 tablet (1 mg total) by mouth daily as needed. 30 tablet 5  . ibuprofen (ADVIL,MOTRIN) 800 MG tablet Take 1 tablet (800 mg total) by mouth every 8 (eight) hours as needed. 30 tablet 1  . loratadine (CLARITIN) 10 MG tablet Take 10 mg by mouth daily.    . Multiple Vitamin (MULTIVITAMIN) tablet Take 1 tablet by mouth daily.    . nitrofurantoin, macrocrystal-monohydrate, (MACROBID) 100 MG capsule Take 1 tab PO after intercourse, may repeat in 12 hrs if symptoms still present. 30 capsule 0  . Probiotic Product (PROBIOTIC PO) Take by mouth.    . sulfamethoxazole-trimethoprim (BACTRIM DS) 800-160 MG tablet Take 1 tablet by mouth 2 (two) times daily. One PO BID x 3 days 20 tablet 0  . traZODone (DESYREL) 50 MG tablet Take one to  two at bedtime as needed for sleep 45 tablet 5   No current facility-administered medications for this visit.      ALLERGIES: Effexor [venlafaxine hcl] and Zoloft [sertraline hcl]  Family History  Problem Relation Age of Onset  . Hypertension Mother   . Diabetes Mother        Type 2  . Thyroid disease Mother   . Glaucoma Mother   . Hypertension Father   . Atrial fibrillation Father   . Cancer Father        bladder & liver  . Heart disease Father   . Crohn's disease Father   . Heart attack Maternal Grandfather   . Diabetes Maternal Grandmother        Type 2  . Hypertension Maternal Grandmother   . Heart disease Maternal Grandmother   . Heart disease Paternal Grandmother   . CAD Maternal Uncle   . Hypertension Brother   . Melanoma Brother   . Heart murmur Brother   . Other Brother        leaky aortic valve  . Breast  cancer Neg Hx     Social History   Socioeconomic History  . Marital status: Divorced    Spouse name: Not on file  . Number of children: Not on file  . Years of education: Not on file  . Highest education level: Not on file  Occupational History  . Not on file  Social Needs  . Financial resource strain: Not on file  . Food insecurity:    Worry: Not on file    Inability: Not on file  . Transportation needs:    Medical: Not on file    Non-medical: Not on file  Tobacco Use  . Smoking status: Former Games developer  . Smokeless tobacco: Never Used  Substance and Sexual Activity  . Alcohol use: Yes    Alcohol/week: 0.0 - 3.0 standard drinks  . Drug use: No  . Sexual activity: Yes    Partners: Male    Birth control/protection: Surgical    Comment: TAH  Lifestyle  . Physical activity:    Days per week: Not on file    Minutes per session: Not on file  . Stress: Not on file  Relationships  . Social connections:    Talks on phone: Not on file    Gets together: Not on file    Attends religious service: Not on file    Active member of club or organization: Not on file    Attends meetings of clubs or organizations: Not on file    Relationship status: Not on file  . Intimate partner violence:    Fear of current or ex partner: Not on file    Emotionally abused: Not on file    Physically abused: Not on file    Forced sexual activity: Not on file  Other Topics Concern  . Not on file  Social History Narrative  . Not on file    Review of Systems  Constitutional: Negative.   HENT: Negative.   Eyes: Negative.   Respiratory: Negative.   Cardiovascular: Negative.   Gastrointestinal: Negative.   Genitourinary: Negative.   Musculoskeletal: Negative.   Skin:       Breast tender, bruising  Neurological: Negative.   Endo/Heme/Allergies: Negative.   Psychiatric/Behavioral: Negative.     PHYSICAL EXAMINATION:    BP 118/80   Pulse 68   Temp 98 F (36.7 C) (Oral)   Resp 16   Wt  142 lb (  64.4 kg)   LMP 01/03/2000   BMI 25.56 kg/m     General appearance: alert, cooperative and appears stated age Breasts: in the right breast in the upper outer quadrant is a 4 x 2.5 cm tender, smooth, mobile mass. She has extensive bruising on her breast. Still with erythema of the skin. No left breast lumps. Evidence or prior breast reductions. She has bilateral nipple piercings.  No axillary or supraclavicular adenopathy. Patient examined sitting and supine.   ASSESSMENT Right breast lump, on imaging last week she had a 3.2 cm indeterminate mass without evidence of abscess. Biopsy returned with fibrocystic changes and inflammation, c/w early or evolving abscess.  She has been on Bactrim DS for the last week. She feels her pain is 70% better, the mass feels more elongated and slightly bigger. She has significant ecchymosis, but still has some skin erythema She has nipple piercing's bilaterally.      PLAN Recommended she remove the nipple ring from that breast Will change antibiotics to clinda and will add flagyl (no ETOH) because of the foreign object If she is feeling any worsening in symptoms she will call Otherwise f/u next week   An After Visit Summary was printed and given to the patient.

## 2018-04-05 ENCOUNTER — Other Ambulatory Visit: Payer: Self-pay | Admitting: Obstetrics and Gynecology

## 2018-04-05 ENCOUNTER — Other Ambulatory Visit: Payer: Self-pay

## 2018-04-05 ENCOUNTER — Ambulatory Visit
Admission: RE | Admit: 2018-04-05 | Discharge: 2018-04-05 | Disposition: A | Discharge status: Home / Self Care | Payer: BLUE CROSS/BLUE SHIELD | Source: Ambulatory Visit | Attending: Obstetrics and Gynecology | Admitting: Obstetrics and Gynecology

## 2018-04-05 DIAGNOSIS — N6310 Unspecified lump in the right breast, unspecified quadrant: Secondary | ICD-10-CM

## 2018-04-11 ENCOUNTER — Other Ambulatory Visit: Payer: Self-pay

## 2018-04-11 ENCOUNTER — Ambulatory Visit: Payer: BLUE CROSS/BLUE SHIELD | Admitting: Obstetrics and Gynecology

## 2018-04-11 ENCOUNTER — Encounter: Payer: Self-pay | Admitting: Obstetrics and Gynecology

## 2018-04-11 VITALS — BP 118/82 | HR 80 | Temp 97.7°F | Resp 16 | Ht 62.5 in | Wt 139.0 lb

## 2018-04-11 DIAGNOSIS — N611 Abscess of the breast and nipple: Secondary | ICD-10-CM

## 2018-04-11 NOTE — Progress Notes (Signed)
GYNECOLOGY  VISIT   HPI: 53 y.o.   Divorced White or Caucasian Not Hispanic or Latino  female   [phone] with Patient's last menstrual period was 01/03/2000.   here for Right Breast check, she is being treated for a breast abscess. Antibiotics were changed last week from Bactrim to Clinda and Flagyl. Ultrasound last week with slight improvement.  She is feeling better, the lump is smaller and less tender. She thinks she got a blistery rash from the bactrim (started prior to the new antibiotics). She has a f/u breast ultrasound scheduled.   GYNECOLOGIC HISTORY: Patient's last menstrual period was 01/03/2000. Contraception: hysterectomy  Menopausal hormone therapy: none        OB History    Gravida  5   Para  3   Term  1   Preterm  2   AB  2   Living  3     SAB      TAB  1   Ectopic      Multiple      Live Births  3              Patient Active Problem List   Diagnosis Date Noted  . Tendinitis of both rotator cuffs 01/25/2017  . Generalized anxiety disorder 12/29/2014  . Chronic left shoulder pain 07/11/2013  . Insomnia 04/11/2013  . Atypical chest pain 01/17/2013  . Dyspnea 01/03/2013  . Esophageal reflux 05/30/2012  . Depression 05/30/2012  . Chronic cough 05/30/2012    Past Medical History:  Diagnosis Date  . Abnormal Pap smear of cervix    over 30 yrs ago  . Anxiety   . Chronic joint pain   . Depression   . GDM (gestational diabetes mellitus)    History  . IBS (irritable bowel syndrome)   . Reflux     Past Surgical History:  Procedure Laterality Date  . ABDOMINAL HYSTERECTOMY  2002   TAH- Fibroids , endometriosis; ov retained  . BACK SURGERY  2005   L5-S1 Fusion  . CESAREAN SECTION  1987  . ESOPHAGOGASTRODUODENOSCOPY    . LAPAROSCOPIC CHOLECYSTECTOMY  1995  . MASTOPEXY  2012  . REDUCTION MAMMAPLASTY Bilateral   . TONSILLECTOMY    . TUBAL LIGATION  1993   BTSP    Current Outpatient Medications  Medication Sig Dispense Refill  .  albuterol (PROVENTIL HFA;VENTOLIN HFA) 108 (90 Base) MCG/ACT inhaler Inhale 2 puffs into the lungs every 6 (six) hours as needed for wheezing or shortness of breath. 1 Inhaler 2  . Boswellia-Glucosamine-Vit D (OSTEO BI-FLEX ONE PER DAY PO) Take by mouth.    . clindamycin (CLEOCIN) 300 MG capsule Take 1 capsule (300 mg total) by mouth 3 (three) times daily. 30 capsule 0  . clonazePAM (KLONOPIN) 1 MG tablet Take 1 tablet (1 mg total) by mouth daily as needed. 30 tablet 5  . ibuprofen (ADVIL,MOTRIN) 800 MG tablet Take 1 tablet (800 mg total) by mouth every 8 (eight) hours as needed. 30 tablet 1  . loratadine (CLARITIN) 10 MG tablet Take 10 mg by mouth daily.    . Multiple Vitamin (MULTIVITAMIN) tablet Take 1 tablet by mouth daily.    . nitrofurantoin, macrocrystal-monohydrate, (MACROBID) 100 MG capsule Take 1 tab PO after intercourse, may repeat in 12 hrs if symptoms still present. 30 capsule 0  . Probiotic Product (PROBIOTIC PO) Take by mouth.    . traZODone (DESYREL) 50 MG tablet Take one to two at bedtime as needed for sleep 45  tablet 5   No current facility-administered medications for this visit.      ALLERGIES: Effexor [venlafaxine hcl]; Sulfa antibiotics; and Zoloft [sertraline hcl]  Family History  Problem Relation Age of Onset  . Hypertension Mother   . Diabetes Mother        Type 2  . Thyroid disease Mother   . Glaucoma Mother   . Hypertension Father   . Atrial fibrillation Father   . Cancer Father        bladder & liver  . Heart disease Father   . Crohn's disease Father   . Heart attack Maternal Grandfather   . Diabetes Maternal Grandmother        Type 2  . Hypertension Maternal Grandmother   . Heart disease Maternal Grandmother   . Heart disease Paternal Grandmother   . CAD Maternal Uncle   . Hypertension Brother   . Melanoma Brother   . Heart murmur Brother   . Other Brother        leaky aortic valve  . Breast cancer Neg Hx     Social History   Socioeconomic  History  . Marital status: Divorced    Spouse name: Not on file  . Number of children: Not on file  . Years of education: Not on file  . Highest education level: Not on file  Occupational History  . Not on file  Social Needs  . Financial resource strain: Not on file  . Food insecurity:    Worry: Not on file    Inability: Not on file  . Transportation needs:    Medical: Not on file    Non-medical: Not on file  Tobacco Use  . Smoking status: Former Games developer  . Smokeless tobacco: Never Used  Substance and Sexual Activity  . Alcohol use: Yes    Alcohol/week: 0.0 - 3.0 standard drinks  . Drug use: No  . Sexual activity: Yes    Partners: Male    Birth control/protection: Surgical    Comment: TAH  Lifestyle  . Physical activity:    Days per week: Not on file    Minutes per session: Not on file  . Stress: Not on file  Relationships  . Social connections:    Talks on phone: Not on file    Gets together: Not on file    Attends religious service: Not on file    Active member of club or organization: Not on file    Attends meetings of clubs or organizations: Not on file    Relationship status: Not on file  . Intimate partner violence:    Fear of current or ex partner: Not on file    Emotionally abused: Not on file    Physically abused: Not on file    Forced sexual activity: Not on file  Other Topics Concern  . Not on file  Social History Narrative  . Not on file    Review of Systems  Constitutional: Negative.   HENT: Negative.   Eyes: Negative.   Respiratory: Negative.   Cardiovascular: Negative.   Gastrointestinal: Negative.   Genitourinary: Negative.   Musculoskeletal: Negative.   Skin: Negative.   Neurological: Negative.   Endo/Heme/Allergies: Negative.   Psychiatric/Behavioral: Negative.   All other systems reviewed and are negative.   PHYSICAL EXAMINATION:    BP 118/82   Pulse 80   Temp 97.7 F (36.5 C) (Oral)   Resp 16   Ht 5' 2.5" (1.588 m)   Wt 139  lb (63 kg)   LMP 01/03/2000   BMI 25.02 kg/m     General appearance: alert, cooperative and appears stated age Breasts: In the right breast in the upper outer quadrant is a 5 x 1.5 cm firm lump, minimally tender (feels longer and thinner). Bruising is resolved. Rash from blister is resolving).   ASSESSMENT Right breast abscess, lump still present, but clinically much better.     PLAN Finish course of antibiotics F/u with breast ultrasound as scheduled F/U here in one month for a repeat breast exam Call with any concerns.     An After Visit Summary was printed and given to the patient.

## 2018-04-15 ENCOUNTER — Ambulatory Visit
Admission: RE | Admit: 2018-04-15 | Discharge: 2018-04-15 | Disposition: A | Discharge status: Home / Self Care | Payer: BLUE CROSS/BLUE SHIELD | Source: Ambulatory Visit | Attending: Obstetrics and Gynecology | Admitting: Obstetrics and Gynecology

## 2018-04-15 ENCOUNTER — Other Ambulatory Visit: Payer: Self-pay

## 2018-04-15 ENCOUNTER — Other Ambulatory Visit: Payer: Self-pay | Admitting: Obstetrics and Gynecology

## 2018-04-15 DIAGNOSIS — N6310 Unspecified lump in the right breast, unspecified quadrant: Secondary | ICD-10-CM

## 2018-05-02 ENCOUNTER — Other Ambulatory Visit: Payer: Self-pay

## 2018-05-02 ENCOUNTER — Ambulatory Visit
Admission: RE | Admit: 2018-05-02 | Discharge: 2018-05-02 | Disposition: A | Discharge status: Home / Self Care | Payer: BLUE CROSS/BLUE SHIELD | Source: Ambulatory Visit | Attending: Obstetrics and Gynecology | Admitting: Obstetrics and Gynecology

## 2018-05-02 ENCOUNTER — Other Ambulatory Visit: Payer: Self-pay | Admitting: Obstetrics and Gynecology

## 2018-05-02 DIAGNOSIS — N61 Mastitis without abscess: Secondary | ICD-10-CM

## 2018-05-02 DIAGNOSIS — N6310 Unspecified lump in the right breast, unspecified quadrant: Secondary | ICD-10-CM

## 2018-05-13 ENCOUNTER — Encounter: Payer: Self-pay | Admitting: Obstetrics and Gynecology

## 2018-05-13 ENCOUNTER — Ambulatory Visit: Payer: BLUE CROSS/BLUE SHIELD | Admitting: Obstetrics and Gynecology

## 2018-05-13 ENCOUNTER — Other Ambulatory Visit: Payer: Self-pay

## 2018-05-13 VITALS — BP 118/84 | HR 68 | Temp 98.5°F | Resp 12 | Wt 141.0 lb

## 2018-05-13 DIAGNOSIS — N952 Postmenopausal atrophic vaginitis: Secondary | ICD-10-CM | POA: Diagnosis not present

## 2018-05-13 DIAGNOSIS — N6310 Unspecified lump in the right breast, unspecified quadrant: Secondary | ICD-10-CM | POA: Diagnosis not present

## 2018-05-13 DIAGNOSIS — Z872 Personal history of diseases of the skin and subcutaneous tissue: Secondary | ICD-10-CM | POA: Diagnosis not present

## 2018-05-13 MED ORDER — ESTRADIOL 10 MCG VA TABS: 1.0000 | ORAL_TABLET | VAGINAL | 0 refills | Status: DC

## 2018-05-13 NOTE — Progress Notes (Signed)
GYNECOLOGY  VISIT   HPI: 53 y.o.   Divorced White or Caucasian Not Hispanic or Latino  female   [phone] with Patient's last menstrual period was 01/03/2000.   here for right breast recheck, she was treated for a breast abscess in late March,2020. Patient states that there is some discoloration at the area where the biopsy was done. Per patient, tenderness and the feel of the lump is much better. She still feels a small lump, no longer tender.    Last imaging recommended f/u diagnostic mammogram and right breast ultrasound in 10/20.   She c/o dryness with intercourse, even with lubrication. Enjoys intercourse, some increased difficulty in reaching orgasm.  She has occasional hot flashes and night sweats, tolerable, mostly just feels warm.   GYNECOLOGIC HISTORY: Patient's last menstrual period was 01/03/2000. Contraception:hysterectomy Menopausal hormone therapy: none        OB History    Gravida  5   Para  3   Term  1   Preterm  2   AB  2   Living  3     SAB      TAB  1   Ectopic      Multiple      Live Births  3              Patient Active Problem List   Diagnosis Date Noted  . Tendinitis of both rotator cuffs 01/25/2017  . Generalized anxiety disorder 12/29/2014  . Chronic left shoulder pain 07/11/2013  . Insomnia 04/11/2013  . Atypical chest pain 01/17/2013  . Dyspnea 01/03/2013  . Esophageal reflux 05/30/2012  . Depression 05/30/2012  . Chronic cough 05/30/2012    Past Medical History:  Diagnosis Date  . Abnormal Pap smear of cervix    over 30 yrs ago  . Anxiety   . Chronic joint pain   . Depression   . GDM (gestational diabetes mellitus)    History  . IBS (irritable bowel syndrome)   . Reflux     Past Surgical History:  Procedure Laterality Date  . ABDOMINAL HYSTERECTOMY  2002   TAH- Fibroids , endometriosis; ov retained  . BACK SURGERY  2005   L5-S1 Fusion  . CESAREAN SECTION  1987  . ESOPHAGOGASTRODUODENOSCOPY    . LAPAROSCOPIC  CHOLECYSTECTOMY  1995  . MASTOPEXY  2012  . REDUCTION MAMMAPLASTY Bilateral   . TONSILLECTOMY    . TUBAL LIGATION  1993   BTSP    Current Outpatient Medications  Medication Sig Dispense Refill  . albuterol (PROVENTIL HFA;VENTOLIN HFA) 108 (90 Base) MCG/ACT inhaler Inhale 2 puffs into the lungs every 6 (six) hours as needed for wheezing or shortness of breath. 1 Inhaler 2  . Boswellia-Glucosamine-Vit D (OSTEO BI-FLEX ONE PER DAY PO) Take by mouth.    . clonazePAM (KLONOPIN) 1 MG tablet Take 1 tablet (1 mg total) by mouth daily as needed. 30 tablet 5  . ibuprofen (ADVIL,MOTRIN) 800 MG tablet Take 1 tablet (800 mg total) by mouth every 8 (eight) hours as needed. 30 tablet 1  . loratadine (CLARITIN) 10 MG tablet Take 10 mg by mouth daily.    . Multiple Vitamin (MULTIVITAMIN) tablet Take 1 tablet by mouth daily.    . nitrofurantoin, macrocrystal-monohydrate, (MACROBID) 100 MG capsule Take 1 tab PO after intercourse, may repeat in 12 hrs if symptoms still present. 30 capsule 0  . Probiotic Product (PROBIOTIC PO) Take by mouth.    . traMADol (ULTRAM) 50 MG tablet TAKE 1  TABLET BY MOUTH EVERY 6 HOURS AS NEEDED FOR UP TO 7 DAYS FOR PAIN    . traZODone (DESYREL) 50 MG tablet Take one to two at bedtime as needed for sleep 45 tablet 5  . NARCAN 4 MG/0.1ML LIQD nasal spray kit CALL 911. ADMINISTER A SINGLE SPRAY OF NARCAN IN ONE NOSTRIL. REPEAT EVERY 3 MINUTES AS NEEDED IF NO OR MINIMAL RESPONSE.     No current facility-administered medications for this visit.      ALLERGIES: Effexor [venlafaxine hcl]; Sulfa antibiotics; and Zoloft [sertraline hcl]  Family History  Problem Relation Age of Onset  . Hypertension Mother   . Diabetes Mother        Type 2  . Thyroid disease Mother   . Glaucoma Mother   . Hypertension Father   . Atrial fibrillation Father   . Cancer Father        bladder & liver  . Heart disease Father   . Crohn's disease Father   . Heart attack Maternal Grandfather   .  Diabetes Maternal Grandmother        Type 2  . Hypertension Maternal Grandmother   . Heart disease Maternal Grandmother   . Heart disease Paternal Grandmother   . CAD Maternal Uncle   . Hypertension Brother   . Melanoma Brother   . Heart murmur Brother   . Other Brother        leaky aortic valve  . Breast cancer Neg Hx     Social History   Socioeconomic History  . Marital status: Divorced    Spouse name: Not on file  . Number of children: Not on file  . Years of education: Not on file  . Highest education level: Not on file  Occupational History  . Not on file  Social Needs  . Financial resource strain: Not on file  . Food insecurity:    Worry: Not on file    Inability: Not on file  . Transportation needs:    Medical: Not on file    Non-medical: Not on file  Tobacco Use  . Smoking status: Former Research scientist (life sciences)  . Smokeless tobacco: Never Used  Substance and Sexual Activity  . Alcohol use: Yes    Alcohol/week: 0.0 - 3.0 standard drinks  . Drug use: No  . Sexual activity: Yes    Partners: Male    Birth control/protection: Surgical    Comment: TAH  Lifestyle  . Physical activity:    Days per week: Not on file    Minutes per session: Not on file  . Stress: Not on file  Relationships  . Social connections:    Talks on phone: Not on file    Gets together: Not on file    Attends religious service: Not on file    Active member of club or organization: Not on file    Attends meetings of clubs or organizations: Not on file    Relationship status: Not on file  . Intimate partner violence:    Fear of current or ex partner: Not on file    Emotionally abused: Not on file    Physically abused: Not on file    Forced sexual activity: Not on file  Other Topics Concern  . Not on file  Social History Narrative  . Not on file    Review of Systems  Constitutional: Negative.   HENT: Negative.   Eyes: Negative.   Respiratory: Negative.   Cardiovascular: Negative.    Gastrointestinal: Negative.  Genitourinary: Negative.   Musculoskeletal: Negative.   Skin: Negative.   Neurological: Negative.   Endo/Heme/Allergies: Negative.   Psychiatric/Behavioral: Negative.     PHYSICAL EXAMINATION:    BP 118/84 (BP Location: Right Arm, Patient Position: Sitting, Cuff Size: Normal)   Pulse 68   Temp 98.5 F (36.9 C) (Oral)   Resp 12   Wt 141 lb (64 kg)   LMP 01/03/2000   BMI 25.38 kg/m     General appearance: alert, cooperative and appears stated age Breasts: 2 x 1.5 cm area or thickening in the left breast at ~9 o'clock, smaller than at her last visit. Not tender. No skin changes.  No axillary or supraclavicular adenopathy  ASSESSMENT H/O breast abscess, negative biopsy, the lump continues to get smaller, no longer tender C/O vaginal dryness/atrophy     PLAN Continue with monthly breast check Return for breast check in 3 months Diagnostic breast imaging in 10/20 Start vaginal estrogen  Continue vaginal lubrication   An After Visit Summary was printed and given to the patient.  15 minutes face to face time of which over 50% was spent in counseling.

## 2018-05-22 ENCOUNTER — Telehealth: Payer: Self-pay | Admitting: Family Medicine

## 2018-05-22 ENCOUNTER — Emergency Department (HOSPITAL_COMMUNITY): Payer: BLUE CROSS/BLUE SHIELD

## 2018-05-22 ENCOUNTER — Emergency Department (HOSPITAL_COMMUNITY)
Admission: EM | Admit: 2018-05-22 | Discharge: 2018-05-22 | Disposition: A | Discharge status: Home / Self Care | Payer: BLUE CROSS/BLUE SHIELD | Attending: Emergency Medicine | Admitting: Emergency Medicine

## 2018-05-22 ENCOUNTER — Encounter (HOSPITAL_COMMUNITY): Payer: Self-pay | Admitting: *Deleted

## 2018-05-22 ENCOUNTER — Other Ambulatory Visit: Payer: Self-pay

## 2018-05-22 DIAGNOSIS — K625 Hemorrhage of anus and rectum: Secondary | ICD-10-CM | POA: Diagnosis present

## 2018-05-22 DIAGNOSIS — K529 Noninfective gastroenteritis and colitis, unspecified: Secondary | ICD-10-CM | POA: Diagnosis not present

## 2018-05-22 DIAGNOSIS — Z79899 Other long term (current) drug therapy: Secondary | ICD-10-CM | POA: Diagnosis not present

## 2018-05-22 DIAGNOSIS — Z87891 Personal history of nicotine dependence: Secondary | ICD-10-CM | POA: Insufficient documentation

## 2018-05-22 LAB — COMPREHENSIVE METABOLIC PANEL
ALT: 19 U/L (ref 0–44)
AST: 20 U/L (ref 15–41)
Albumin: 3.8 g/dL (ref 3.5–5.0)
Alkaline Phosphatase: 57 U/L (ref 38–126)
Anion gap: 9 (ref 5–15)
BUN: 13 mg/dL (ref 6–20)
CO2: 26 mmol/L (ref 22–32)
Calcium: 9 mg/dL (ref 8.9–10.3)
Chloride: 104 mmol/L (ref 98–111)
Creatinine, Ser: 0.73 mg/dL (ref 0.44–1.00)
GFR calc Af Amer: >60 mL/min (ref >60)
GFR calc non Af Amer: >60 mL/min (ref >60)
Glucose, Bld: 104 mg/dL — ABNORMAL HIGH (ref 70–99)
Potassium: 3.9 mmol/L (ref 3.5–5.1)
Sodium: 139 mmol/L (ref 135–145)
Total Bilirubin: 1.3 mg/dL — ABNORMAL HIGH (ref 0.3–1.2)
Total Protein: 6.3 g/dL — ABNORMAL LOW (ref 6.5–8.1)

## 2018-05-22 LAB — CBC WITH DIFFERENTIAL/PLATELET
Abs Immature Granulocytes: 0.01 10*3/uL (ref 0.00–0.07)
Basophils Absolute: 0 10*3/uL (ref 0.0–0.1)
Basophils Relative: 0 %
Eosinophils Absolute: 0.1 10*3/uL (ref 0.0–0.5)
Eosinophils Relative: 1 %
HCT: 39.6 % (ref 36.0–46.0)
Hemoglobin: 13.7 g/dL (ref 12.0–15.0)
Immature Granulocytes: 0 %
Lymphocytes Relative: 22 %
Lymphs Abs: 1.3 10*3/uL (ref 0.7–4.0)
MCH: 31.8 pg (ref 26.0–34.0)
MCHC: 34.6 g/dL (ref 30.0–36.0)
MCV: 91.9 fL (ref 80.0–100.0)
Monocytes Absolute: 0.4 10*3/uL (ref 0.1–1.0)
Monocytes Relative: 7 %
Neutro Abs: 4 10*3/uL (ref 1.7–7.7)
Neutrophils Relative %: 70 %
Platelets: 238 10*3/uL (ref 150–400)
RBC: 4.31 MIL/uL (ref 3.87–5.11)
RDW: 11.8 % (ref 11.5–15.5)
WBC: 5.8 10*3/uL (ref 4.0–10.5)
nRBC: 0 % (ref 0.0–0.2)

## 2018-05-22 LAB — C DIFFICILE QUICK SCREEN W PCR REFLEX
C Diff antigen: POSITIVE — AB
C Diff interpretation: DETECTED
C Diff toxin: POSITIVE — AB

## 2018-05-22 LAB — LIPASE, BLOOD: Lipase: 37 U/L (ref 11–51)

## 2018-05-22 MED ORDER — IOHEXOL 300 MG/ML  SOLN
100.0000 mL | Freq: Once | INTRAMUSCULAR | Status: AC | PRN
  Administered 2018-05-22: 100 mL via INTRAVENOUS

## 2018-05-22 MED ORDER — METRONIDAZOLE 500 MG PO TABS: 500.0000 mg | ORAL_TABLET | Freq: Two times a day (BID) | ORAL | 0 refills | Status: DC

## 2018-05-22 MED ORDER — METRONIDAZOLE 500 MG PO TABS
500.0000 mg | ORAL_TABLET | Freq: Once | ORAL | Status: AC
  Administered 2018-05-22: 500 mg via ORAL
  Filled 2018-05-22: qty 1

## 2018-05-22 MED ORDER — IOHEXOL 300 MG/ML  SOLN
30.0000 mL | Freq: Once | INTRAMUSCULAR | Status: AC | PRN
  Administered 2018-05-22: 30 mL via ORAL

## 2018-05-22 MED ORDER — ONDANSETRON 4 MG PO TBDP: 4.0000 mg | ORAL_TABLET | Freq: Three times a day (TID) | ORAL | 0 refills | Status: DC | PRN

## 2018-05-22 MED ORDER — CIPROFLOXACIN HCL 500 MG PO TABS: 500.0000 mg | ORAL_TABLET | Freq: Two times a day (BID) | ORAL | 0 refills | Status: DC

## 2018-05-22 MED ORDER — ONDANSETRON 4 MG PO TBDP
4.0000 mg | ORAL_TABLET | Freq: Once | ORAL | Status: AC
  Administered 2018-05-22: 4 mg via ORAL
  Filled 2018-05-22: qty 1

## 2018-05-22 MED ORDER — CIPROFLOXACIN HCL 250 MG PO TABS
500.0000 mg | ORAL_TABLET | Freq: Once | ORAL | Status: AC
  Administered 2018-05-22: 500 mg via ORAL
  Filled 2018-05-22: qty 2

## 2018-05-22 NOTE — ED Notes (Addendum)
Date and time results received: 05/22/18 7:17 PM  (use smartphrase ".now" to insert current time)  Test: C-diff Critical Value: positive  Name of Provider Notified: Burgess Amor, PA and Dr Juleen China  Orders Received? Or Actions Taken?: no new orders at this time. Enteric precautions initiated

## 2018-05-22 NOTE — ED Notes (Addendum)
Patient states she has had 3 bloody diarrhea occurrences this morning. Bright red blood. Denies any other symptoms.

## 2018-05-22 NOTE — ED Triage Notes (Signed)
Pt complained of rectal bleeding started this am. Pt called PCP and they referred to ER. Pt states it is bright red. Denies pain, mild nausea.

## 2018-05-22 NOTE — Telephone Encounter (Signed)
Pt contacted office. Pt states that she woke up this morning and noticed bright red blood in stool and stool was mucusy. Pt states that her stool looked like "human tissue". Pt states she has had nausea on and off, some mild abdominal pain. No fever, no chills. Pt feels really tired. Spoke with provider. Provider states that pt would need to go to ER. Informed patient that providers recommended ER; pt verbalized understanding and states she will go to [facility].

## 2018-05-22 NOTE — ED Provider Notes (Signed)
Pt signed out to me from [place], [place], pending CT imaging which was positive for distal colitis type picture.  She has had 4 episodes of diarrhea today, one while here with small amount of blood in specimen.  She reports completing course of flagyl and clindamycin 04/12/18 for breast abscess, timing for C dif caused by this abx exposure unlikely, but culture sent.  She was started on cipro and flagyl, zofran for nausea prn. She was comfortable at time of dc, abd soft, non acute abd.  Return precautions discussed. GI f/u recommended (she see's Dr. Loreta Ave in [facility]).    Burgess Amor, PA-C 05/22/18 1825    Vanetta Mulders, MD 05/25/18 [phone]

## 2018-05-22 NOTE — ED Provider Notes (Signed)
[facility] EMERGENCY DEPARTMENT Provider Note   CSN: [id number] Arrival date & time: 05/22/18  1412    History   Chief Complaint Chief Complaint  Patient presents with  . Rectal Bleeding    HPI [facility] is a 53 y.o. female.     The history is provided by the patient. No language interpreter was used.  Rectal Bleeding  Quality:  Bright red Amount:  Moderate Duration:  1 day Timing:  Constant Chronicity:  New Context: not hemorrhoids and not rectal pain   Similar prior episodes: no   Relieved by:  Nothing Worsened by:  Nothing Associated symptoms: abdominal pain   Pt had a normal colonoscopy 2 years ago.  Pt reports she has had bright red blood three times today,  No formed stool   Past Medical History:  Diagnosis Date  . Abnormal Pap smear of cervix    over 30 yrs ago  . Anxiety   . Chronic joint pain   . Depression   . GDM (gestational diabetes mellitus)    History  . IBS (irritable bowel syndrome)   . Reflux     Patient Active Problem List   Diagnosis Date Noted  . Tendinitis of both rotator cuffs 01/25/2017  . Generalized anxiety disorder 12/29/2014  . Chronic left shoulder pain 07/11/2013  . Insomnia 04/11/2013  . Atypical chest pain 01/17/2013  . Dyspnea 01/03/2013  . Esophageal reflux 05/30/2012  . Depression 05/30/2012  . Chronic cough 05/30/2012    Past Surgical History:  Procedure Laterality Date  . ABDOMINAL HYSTERECTOMY  2002   TAH- Fibroids , endometriosis; ov retained  . BACK SURGERY  2005   L5-S1 Fusion  . CESAREAN SECTION  1987  . ESOPHAGOGASTRODUODENOSCOPY    . LAPAROSCOPIC CHOLECYSTECTOMY  1995  . MASTOPEXY  2012  . REDUCTION MAMMAPLASTY Bilateral   . TONSILLECTOMY    . TUBAL LIGATION  1993   BTSP     OB History    Gravida  5   Para  3   Term  1   Preterm  2   AB  2   Living  3     SAB      TAB  1   Ectopic      Multiple      Live Births  3            Home Medications    Prior to  Admission medications   Medication Sig Start Date End Date Taking? Authorizing Provider  albuterol (PROVENTIL HFA;VENTOLIN HFA) 108 (90 Base) MCG/ACT inhaler Inhale 2 puffs into the lungs every 6 (six) hours as needed for wheezing or shortness of breath. 09/19/17   Mikey Kirschner, MD  Boswellia-Glucosamine-Vit D (OSTEO BI-FLEX ONE PER DAY PO) Take by mouth.    [provider]  clonazePAM (KLONOPIN) 1 MG tablet Take 1 tablet (1 mg total) by mouth daily as needed. 03/12/18   Mikey Kirschner, MD  Estradiol 10 MCG TABS vaginal tablet Place 1 tablet (10 mcg total) vaginally 2 (two) times a week. 05/13/18   Salvadore Dom, MD  ibuprofen (ADVIL,MOTRIN) 800 MG tablet Take 1 tablet (800 mg total) by mouth every 8 (eight) hours as needed. 03/27/18   Salvadore Dom, MD  loratadine (CLARITIN) 10 MG tablet Take 10 mg by mouth daily.    [provider]  Multiple Vitamin (MULTIVITAMIN) tablet Take 1 tablet by mouth daily.    [provider]  NARCAN 4 MG/0.1ML LIQD  nasal spray kit CALL 911. ADMINISTER A SINGLE SPRAY OF NARCAN IN ONE NOSTRIL. REPEAT EVERY 3 MINUTES AS NEEDED IF NO OR MINIMAL RESPONSE. 05/03/18   [provider]  nitrofurantoin, macrocrystal-monohydrate, (MACROBID) 100 MG capsule Take 1 tab PO after intercourse, may repeat in 12 hrs if symptoms still present. 12/14/17   Regina Eck, CNM  Probiotic Product (PROBIOTIC PO) Take by mouth.    [provider]  traMADol (ULTRAM) 50 MG tablet TAKE 1 TABLET BY MOUTH EVERY 6 HOURS AS NEEDED FOR UP TO 7 DAYS FOR PAIN 05/02/18   [provider]  traZODone (DESYREL) 50 MG tablet Take one to two at bedtime as needed for sleep 01/04/18   Mikey Kirschner, MD    Family History Family History  Problem Relation Age of Onset  . Hypertension Mother   . Diabetes Mother        Type 2  . Thyroid disease Mother   . Glaucoma Mother   . Hypertension Father   . Atrial fibrillation Father   . Cancer  Father        bladder & liver  . Heart disease Father   . Crohn's disease Father   . Heart attack Maternal Grandfather   . Diabetes Maternal Grandmother        Type 2  . Hypertension Maternal Grandmother   . Heart disease Maternal Grandmother   . Heart disease Paternal Grandmother   . CAD Maternal Uncle   . Hypertension Brother   . Melanoma Brother   . Heart murmur Brother   . Other Brother        leaky aortic valve  . Breast cancer Neg Hx     Social History Social History   Tobacco Use  . Smoking status: Former Research scientist (life sciences)  . Smokeless tobacco: Never Used  Substance Use Topics  . Alcohol use: Yes    Alcohol/week: 0.0 - 3.0 standard drinks  . Drug use: No     Allergies   Effexor [venlafaxine hcl]; Sulfa antibiotics; and Zoloft [sertraline hcl]   Review of Systems Review of Systems  Gastrointestinal: Positive for abdominal pain and hematochezia.  All other systems reviewed and are negative.    Physical Exam Updated Vital Signs Temp 98.3 F (36.8 C) (Oral)   Ht _0  (1.6 m)   Wt 63.5 kg   LMP 01/03/2000   SpO2 100%   BMI 24.80 kg/m   Physical Exam Vitals signs reviewed.  HENT:     Head: Normocephalic.     Right Ear: Tympanic membrane normal.     Left Ear: Tympanic membrane normal.     Nose: Nose normal.     Mouth/Throat:     Mouth: Mucous membranes are moist.  Eyes:     Pupils: Pupils are equal, round, and reactive to light.  Neck:     Musculoskeletal: Normal range of motion.  Cardiovascular:     Rate and Rhythm: Normal rate.     Pulses: Normal pulses.  Pulmonary:     Effort: Pulmonary effort is normal.  Abdominal:     General: Abdomen is flat.  Musculoskeletal: Normal range of motion.  Skin:    General: Skin is warm.  Neurological:     General: No focal deficit present.     Mental Status: She is alert.  Psychiatric:        Mood and Affect: Mood normal.      ED Treatments / Results  Labs (all labs ordered are listed, but  only abnormal  results are displayed) Labs Reviewed  CBC WITH DIFFERENTIAL/PLATELET  COMPREHENSIVE METABOLIC PANEL  LIPASE, BLOOD  URINALYSIS, ROUTINE W REFLEX MICROSCOPIC    EKG None  Radiology No results found.  Procedures Procedures (including critical care time)  Medications Ordered in ED Medications - No data to display   Initial Impression / Assessment and Plan / ED Course  I have reviewed the triage vital signs and the nursing notes.  Pertinent labs & imaging results that were available during my care of the patient were reviewed by me and considered in my medical decision making (see chart for details).        Labs and ct ordered.   Final Clinical Impressions(s) / ED Diagnoses   Final diagnoses:  None    ED Discharge Orders    None       Sidney Ace 05/22/18 1539    Fredia Sorrow, MD 05/22/18 1623

## 2018-05-22 NOTE — Discharge Instructions (Addendum)
Take the antibiotics as prescribed until gone - your next doses tomorrow morning.  I recommend a clear liquid diet for the next 24 hours, then you may slowly increase to a regular diet if your symptoms are improving. Return here for any worsening symptoms as discussed including increased pain, bleeding, fevers or any new complaints.

## 2018-05-23 ENCOUNTER — Other Ambulatory Visit: Payer: Self-pay | Admitting: Certified Nurse Midwife

## 2018-05-24 MED ORDER — NITROFURANTOIN MACROCRYSTAL 50 MG PO CAPS: ORAL_CAPSULE | ORAL | 2 refills | Status: DC

## 2018-05-24 NOTE — Telephone Encounter (Signed)
Medication refill request: macrobid 100mg  Last AEX:  10-19-17 Next AEX: 10-28-2018 Last MMG (if hormonal medication request): n/a Refill authorized: please approve if appropriate

## 2018-06-06 ENCOUNTER — Telehealth (HOSPITAL_COMMUNITY): Payer: Self-pay | Admitting: Occupational Therapy

## 2018-06-06 NOTE — Telephone Encounter (Signed)
Spoke with pt her legal name is Angel Silva and she will bring her Driver's Lic in with her to up-date in epic. She wanted to know if we got the referral -confirmed we did and OT has it to Triage.

## 2018-06-10 ENCOUNTER — Ambulatory Visit (INDEPENDENT_AMBULATORY_CARE_PROVIDER_SITE_OTHER): Payer: BC Managed Care – PPO | Admitting: Family Medicine

## 2018-06-10 ENCOUNTER — Other Ambulatory Visit: Payer: Self-pay

## 2018-06-10 DIAGNOSIS — A0472 Enterocolitis due to Clostridium difficile, not specified as recurrent: Secondary | ICD-10-CM | POA: Diagnosis not present

## 2018-06-10 MED ORDER — METRONIDAZOLE 500 MG PO TABS: 500.0000 mg | ORAL_TABLET | Freq: Three times a day (TID) | ORAL | 0 refills | Status: DC

## 2018-06-10 NOTE — Progress Notes (Signed)
   Subjective:    Patient ID: Angel Silva, female    DOB: 1965-06-02, 53 y.o.   MRN: [id number] Very significant time spent researching all of patient's recent notes from hospital visit and specialist visit  Audio only HPI Pt here today for blood in stool. Pt had called office on 05/22/2018 regarding blood in stool. Pt is having abdominal pain. Pt states no fever. Pt stomach is feeling nauseous. Pt states she had some bright red blood in stool last night. Pt has been constipated.   Virtual Visit via Video Note  I connected with Angel Silva on 06/10/18 at  3:00 PM EDT by a video enabled telemedicine application and verified that I am speaking with the correct person using two identifiers.  Location: Patient: home Provider: office   I discussed the limitations of evaluation and management by telemedicine and the availability of in person appointments. The patient expressed understanding and agreed to proceed.  History of Present Illness:    Observations/Objective:   Assessment and Plan:   Follow Up Instructions:    I discussed the assessment and treatment plan with the patient. The patient was provided an opportunity to ask questions and all were answered. The patient agreed with the plan and demonstrated an understanding of the instructions.   The patient was advised to call back or seek an in-person evaluation if the symptoms worsen or if the condition fails to improve as anticipated.  I provided 25 minutes of non-face-to-face time during this encounter. Review of scan reveals elements of distal colitis  Review of laboratories shows C. difficile  Review of medicine shows Cipro and Flagyl given for this.  ER report from several days ago also reviewed patient had a wound infection at site of shoulder surgery.  She elected not to take her Keflex.  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  Will give 10 more days of Flagyl 500 3 times daily 10 days.  Also recommend probiotic.  Also encouraged to get back with her GI clinician.  Warning signs discussed.  Side effects benefits discussed.

## 2018-06-13 ENCOUNTER — Encounter (HOSPITAL_COMMUNITY): Payer: Self-pay | Admitting: Occupational Therapy

## 2018-06-13 ENCOUNTER — Ambulatory Visit (HOSPITAL_COMMUNITY): Payer: BC Managed Care – PPO | Attending: Orthopaedic Surgery | Admitting: Occupational Therapy

## 2018-06-13 ENCOUNTER — Other Ambulatory Visit: Payer: Self-pay

## 2018-06-13 DIAGNOSIS — M25512 Pain in left shoulder: Secondary | ICD-10-CM

## 2018-06-13 DIAGNOSIS — R29898 Other symptoms and signs involving the musculoskeletal system: Secondary | ICD-10-CM | POA: Diagnosis present

## 2018-06-13 DIAGNOSIS — M25612 Stiffness of left shoulder, not elsewhere classified: Secondary | ICD-10-CM | POA: Diagnosis present

## 2018-06-13 NOTE — Patient Instructions (Addendum)
SHOULDER: Flexion On Table   Place hands on table, elbows straight. Move hips away from body. Press hands down into table.  _15__ reps per set, _3__ sets per day  Abduction (Passive)   With arm out to side, resting on table, lower head toward arm, keeping trunk away from table.  Repeat __15__ times. Do _3___ sessions per day.  Copyright  VHI. All rights reserved.     Internal Rotation (Assistive)   Seated with elbow bent at right angle and held against side, slide arm on table surface in an inward arc. Repeat _15___ times. Do __3__ sessions per day. Activity: Use this motion to brush crumbs off the table.  Copyright  VHI. All rights reserved.    COMPLETE PENDULUM EXERCISES FOR 30 SECONDS TO A MINUTE EACH, 3-5 TIMES PER DAY. ROM: Pendulum (Side-to-Side)   Bend forward 90 at waist, using table for support. Rock body side to side to swing arm. Repeat __30__ times. Do __3__ sessions per day.  http://orth.exer.us/792   Copyright  VHI. All rights reserved.  Pendulum Forward/Back   Bend forward 90 at waist, using table for support. Rock body forward and back to swing arm. Repeat __30__ times. Do __3__ sessions per day.  Copyright  VHI. All rights reserved.  Pendulum Circular   Bend forward 90 at waist, leaning on table for support. Rock body in a circular pattern to move arm clockwise __30__ times then counterclockwise __30__ times. Do __3__ sessions per day.  Copyright  VHI. All rights reserved.  AROM: Wrist Extension   With right palm down, bend wrist up. Repeat 10____ times per set. Do ____ sets per session. Do __3__ sessions per day.  Copyright  VHI. All rights reserved.   AROM: Wrist Flexion   With right palm up, bend wrist up. Repeat ___10_ times per set. Do ____ sets per session. Do __3__ sessions per day.  Copyright  VHI. All rights reserved.   AROM: Forearm Pronation / Supination   With right arm in handshake position, slowly rotate palm  down until stretch is felt. Relax. Then rotate palm up until stretch is felt. Repeat __10__ times per set. Do ____ sets per session. Do __3__ sessions per day.  Copyright  VHI. All rights reserved.   AFlexion (Passive)   Use other hand to bend elbow, with thumb toward same shoulder. Do NOT force this motion.  Repeat __10__ times. Do __3__ sessions per day.  Copyright  VHI. All rights reserved.

## 2018-06-13 NOTE — Therapy (Addendum)
[facility] [address] [place], [place], [address] Phone: [phone]   Fax:  [phone]  Occupational Therapy Evaluation  Patient Details  Name: Angel CaoCatherine Sann MRN: [id number] Date of Birth: 02/21/1965 Referring Provider (OT): Dr. Floria RavelingBrian Waterman   Encounter Date: 06/13/2018  OT End of Session - 06/13/18 1122    Visit Number  1    Number of Visits  16    Date for OT Re-Evaluation  08/12/18   mini reassessment 07/11/2018   Authorization Type  BC[employer]    OT Start Time  1006    OT Stop Time  1045    OT Time Calculation (min)  39 min    Activity Tolerance  Patient tolerated treatment well    Behavior During Therapy  [facility] for tasks assessed/performed       Past Medical History:  Diagnosis Date  . Abnormal Pap smear of cervix    over 30 yrs ago  . Anxiety   . Chronic joint pain   . Depression   . GDM (gestational diabetes mellitus)    History  . IBS (irritable bowel syndrome)   . Reflux     Past Surgical History:  Procedure Laterality Date  . ABDOMINAL HYSTERECTOMY  2002   TAH- Fibroids , endometriosis; ov retained  . BACK SURGERY  2005   L5-S1 Fusion  . CESAREAN SECTION  1987  . ESOPHAGOGASTRODUODENOSCOPY    . LAPAROSCOPIC CHOLECYSTECTOMY  1995  . MASTOPEXY  2012  . REDUCTION MAMMAPLASTY Bilateral   . TONSILLECTOMY    . TUBAL LIGATION  1993   BTSP    There were no vitals filed for this visit.  Subjective Assessment - 06/13/18 1116    Subjective   S: I think it's been doing pretty well.    Pertinent History  Pt is a 53 y/o female s/p left RC repair with bicep tenodesis on 06/03/2018. Pt presents in abduction sling, report wearing at all times. Pt was referred to occupational therapy for evaluation and treatment by Dr. Maryruth HancockBrian Waterford.    Special Tests  FOTO: 13/100    Patient Stated Goals  To be able to use my left arm again.    Currently in Pain?  Yes    Pain Score  2     Pain Location  Shoulder    Pain Orientation   Left    Pain Descriptors / Indicators  Aching    Pain Type  Acute pain    Pain Radiating Towards  n/a    Pain Onset  1 to 4 weeks ago    Pain Frequency  Intermittent    Aggravating Factors   movement    Pain Relieving Factors  rest, ice, pain medication    Effect of Pain on Daily Activities  unable to use LUE for ADLs    Multiple Pain Sites  No        [facility] OT Assessment - 06/13/18 1003      Assessment   Medical Diagnosis  s/p left arthroscopic RC repair with mini open bicep tenodesis    Referring Provider (OT)  Dr. Floria RavelingBrian Waterman    Onset Date/Surgical Date  06/03/18    Hand Dominance  Right    Next MD Visit  06/17/2018    Prior Therapy  None      Precautions   Precautions  Shoulder    Type of Shoulder Precautions  See protocol     Shoulder Interventions  Shoulder sling/immobilizer;Off for dressing/bathing/exercises  Balance Screen   Has the patient fallen in the past 6 months  No    Has the patient had a decrease in activity level because of a fear of falling?   No    Is the patient reluctant to leave their home because of a fear of falling?   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, moving items, lifting lightweight items    Leisure  yoga, walking, wants to begin going to gym      ADL   ADL comments  Pt is unable to use LUE for any ADLs       Observation/Other Assessments   Focus on Therapeutic Outcomes (FOTO)   13/100      ROM / Strength   AROM / PROM / Strength  AROM;PROM;Strength      Palpation   Palpation comment  Moderate fascial restrictions along upper arm, trapezius, and scapularis regions      AROM   Overall AROM   Unable to assess;Due to precautions      PROM   Overall PROM Comments  Assessed seated, er/IR adducted    PROM Assessment Site  Shoulder    Right/Left Shoulder  Left    Left Shoulder Flexion  98 Degrees    Left Shoulder  ABduction  61 Degrees    Left Shoulder Internal Rotation  90 Degrees    Left Shoulder External Rotation  0 Degrees      Strength   Overall Strength  Unable to assess;Due to precautions                      OT Education - 06/13/18 1121    Education Details  pendulums, table slides, elbow/wrist ROM    Person(s) Educated  Patient    Methods  Explanation;Demonstration;Handout    Comprehension  Verbalized understanding;Returned demonstration       OT Short Term Goals - 06/13/18 1135      OT SHORT TERM GOAL #1   Title  Pt will be educated on HEP to improve mobility required for ADL and work tasks.    Time  4    Period  Weeks    Status  New    Target Date  07/13/18      OT SHORT TERM GOAL #2   Title  Pt will increase P/ROM of LUE to [facility] to improve ability to perform dressing tasks with minimal compensatory strategies.    Time  4    Period  Weeks    Status  New      OT SHORT TERM GOAL #3   Title  Pt will increase LUE strength to 3-/5 to improve ability to reaching for items at waist level.    Time  4    Period  Weeks    Status  New        OT Long Term Goals - 06/13/18 1140      OT LONG TERM GOAL #1   Title  Pt will return to highest level of functioning and independence in ADL, work, and leisure tasks using LUE as non-dominant.    Time  8    Period  Weeks    Status  New    Target Date  08/12/18      OT LONG TERM GOAL #2   Title  Pt will decrease pain  in LUE to 2/10 or less to improve ability to sleep for 4 consecutive hours or longer at night.    Time  8    Period  Weeks    Status  New      OT LONG TERM GOAL #3   Title  Pt will decrease LUE fascial restrictions to minimal amounts or less to improve mobility required for functional reaching tasks.    Time  8    Period  Weeks    Status  New      OT LONG TERM GOAL #4   Title  Pt will increase LUE A/ROM to [facility] to improve ability to reach overhead and behind back for dressing and bathing tasks.     Time  8    Period  Weeks    Status  New      OT LONG TERM GOAL #5   Title  Pt will improve strength in LUE to 5/5 to improve ability to perform yoga poses and lift lightweight items at work.    Time  8    Period  Weeks    Status  New            Plan - 06/13/18 1123    Clinical Impression Statement  A: Pt is a 53 y/o female s/p left RC repair and mini open biceps tenodesis on 06/03/2018 presenting with functional deficits limiting use of LUE as non-dominant during ADL and work tasks. Pt educated on HEP and demonstrates comprehension.    OT Occupational Profile and History  Problem Focused Assessment - Including review of records relating to presenting problem    Occupational performance deficits (Please refer to evaluation for details):  ADL's;IADL's;Rest and Sleep;Work;Leisure    Body Structure / Function / Physical Skills  ADL;UE functional use;Fascial restriction;Pain;IADL;Scar mobility;ROM;Strength    Rehab Potential  Good    Clinical Decision Making  Limited treatment options, no task modification necessary    Comorbidities Affecting Occupational Performance:  None    Modification or Assistance to Complete Evaluation   No modification of tasks or assist necessary to complete eval    OT Frequency  2x / week    OT Duration  8 weeks    OT Treatment/Interventions  Self-care/ADL training;Ultrasound;Patient/family education;Passive range of motion;Cryotherapy;Electrical Stimulation;Moist Heat;Therapeutic exercise;Manual Therapy;Therapeutic activities    Plan  P: Pt will benefit from skilled OT services to decrease pain and fascial restrictions, increase ROM, strength, and functional use of LUE during ADL tasks. Treatment plan: myofascial release, manual techniques, P/ROM, AA/ROM, A/ROM, scapular mobility/stability/strengthening, general LUE strengthening, modalities prn    Consulted and Agree with Plan of Care  Patient       Patient will benefit from skilled therapeutic intervention in  order to improve the following deficits and impairments:  Body Structure / Function / Physical Skills  Visit Diagnosis: Acute pain of left shoulder  Stiffness of left shoulder, not elsewhere classified  Other symptoms and signs involving the musculoskeletal system    Problem List Patient Active Problem List   Diagnosis Date Noted  . Tendinitis of both rotator cuffs 01/25/2017  . Generalized anxiety disorder 12/29/2014  . Chronic left shoulder pain 07/11/2013  . Insomnia 04/11/2013  . Atypical chest pain 01/17/2013  . Dyspnea 01/03/2013  . Esophageal reflux 05/30/2012  . Depression 05/30/2012  . Chronic cough 05/30/2012   Ezra SitesLeslie Troxler, OTR/L  [phone] 06/13/2018, 12:02 PM  [facility] [address] [place], [place], [address] Phone: [phone]  Fax:  [phone]  Name: Angel CaoCatherine Wetsel MRN: [id number] Date of Birth: 01/18/1965

## 2018-06-14 ENCOUNTER — Encounter: Payer: Self-pay | Admitting: Family Medicine

## 2018-06-18 ENCOUNTER — Telehealth (HOSPITAL_COMMUNITY): Payer: Self-pay | Admitting: Family Medicine

## 2018-06-18 NOTE — Telephone Encounter (Signed)
06/18/18  Patient was on [facility] scheduled for 6/30 at 11:00 and she also had an 11:15 appt.   I called and asked Tye [place] would she mind coming in at 100 instead and she said that was fine.

## 2018-06-19 ENCOUNTER — Encounter (HOSPITAL_COMMUNITY): Payer: Self-pay | Admitting: Specialist

## 2018-06-19 ENCOUNTER — Ambulatory Visit (HOSPITAL_COMMUNITY): Payer: BC Managed Care – PPO | Admitting: Specialist

## 2018-06-19 ENCOUNTER — Other Ambulatory Visit: Payer: Self-pay

## 2018-06-19 DIAGNOSIS — M25512 Pain in left shoulder: Secondary | ICD-10-CM

## 2018-06-19 DIAGNOSIS — M25612 Stiffness of left shoulder, not elsewhere classified: Secondary | ICD-10-CM

## 2018-06-19 DIAGNOSIS — R29898 Other symptoms and signs involving the musculoskeletal system: Secondary | ICD-10-CM

## 2018-06-20 ENCOUNTER — Encounter (HOSPITAL_COMMUNITY): Payer: Self-pay | Admitting: Specialist

## 2018-06-20 NOTE — Therapy (Addendum)
[facility] [address] [place], [place], [address] Phone: [phone]   Fax:  [phone]  Occupational Therapy Treatment  Patient Details  Name: Angel Silva MRN: [id number] Date of Birth: 04/07/1965 Referring Provider (OT): Dr. Floria RavelingBrian Waterman   Encounter Date: 06/19/2018  OT End of Session - 06/20/18 1027    Visit Number  2    Number of Visits  16    Date for OT Re-Evaluation  08/12/18   mini reassess on 7/9   Authorization Type  BCBS    OT Start Time  1515    OT Stop Time  1600    OT Time Calculation (min)  45 min       Past Medical History:  Diagnosis Date  . Abnormal Pap smear of cervix    over 30 yrs ago  . Anxiety   . Chronic joint pain   . Depression   . GDM (gestational diabetes mellitus)    History  . IBS (irritable bowel syndrome)   . Reflux     Past Surgical History:  Procedure Laterality Date  . ABDOMINAL HYSTERECTOMY  2002   TAH- Fibroids , endometriosis; ov retained  . BACK SURGERY  2005   L5-S1 Fusion  . CESAREAN SECTION  1987  . ESOPHAGOGASTRODUODENOSCOPY    . LAPAROSCOPIC CHOLECYSTECTOMY  1995  . MASTOPEXY  2012  . REDUCTION MAMMAPLASTY Bilateral   . TONSILLECTOMY    . TUBAL LIGATION  1993   BTSP    There were no vitals filed for this visit.  Subjective Assessment - 06/20/18 1026    Subjective   S: It is a little sore.  I did my exercises and tried to do a little work around the house (therapist confirmed this was with sling on).    Currently in Pain?  Yes    Pain Score  3     Pain Location  Shoulder    Pain Orientation  Left    Pain Descriptors / Indicators  Aching         [facility] OT Assessment - 06/20/18 0001      Assessment   Medical Diagnosis  s/p left arthroscopic RC repair with mini open bicep tenodesis    Referring Provider (OT)  Dr. Floria RavelingBrian Waterman    Onset Date/Surgical Date  06/03/18    Hand Dominance  Right    Next MD Visit  06/17/2018    Prior Therapy  None       Precautions   Precautions  Shoulder    Type of Shoulder Precautions  See protocol     Shoulder Interventions  Shoulder sling/immobilizer;Off for dressing/bathing/exercises               OT Treatments/Exercises (OP) - 06/20/18 0001      Exercises   Exercises  Shoulder      Shoulder Exercises: Supine   Protraction  PROM;10 reps    External Rotation  PROM;10 reps    Internal Rotation  PROM;10 reps    Flexion  PROM;10 reps    ABduction  PROM;10 reps      Shoulder Exercises: Seated   Elevation  AROM;10 reps    Extension  AROM;10 reps    Row  AROM;10 reps    Other Seated Exercises  a/rom elbow flexion/extension, supination/pronation, wrist flexion/extension 10 times with min cuing for technique       Shoulder Exercises: Therapy Ball   Flexion  10 reps    ABduction  10 reps  Manual Therapy   Manual Therapy  Myofascial release    Manual therapy comments  manual therapy completed seperately from all other interventions    Myofascial Release  myofascial release and manual stretching to left upper arm, scapular, and shoulder region to decrease pain and restrictions and improve pain free mobility and available range in left shoulder region        Cryotherapy  Number Minutes Cryotherapy 15 Minutes  Cryotherapy Location Shoulder (left)  Type of Cryotherapy Ice pack           OT Short Term Goals - 06/19/18 1556      OT SHORT TERM GOAL #1   Title  Pt will be educated on HEP to improve mobility required for ADL and work tasks.    Time  4    Period  Weeks    Status  On-going    Target Date  07/13/18      OT SHORT TERM GOAL #2   Title  Pt will increase P/ROM of LUE to [facility] to improve ability to perform dressing tasks with minimal compensatory strategies.    Time  4    Period  Weeks    Status  On-going      OT SHORT TERM GOAL #3   Title  Pt will increase LUE strength to 3-/5 to improve ability to reaching for items at waist level.    Time  4    Period  Weeks     Status  On-going        OT Long Term Goals - 06/19/18 1557      OT LONG TERM GOAL #1   Title  Pt will return to highest level of functioning and independence in ADL, work, and leisure tasks using LUE as non-dominant.    Time  8    Period  Weeks    Status  On-going      OT LONG TERM GOAL #2   Title  Pt will decrease pain in LUE to 2/10 or less to improve ability to sleep for 4 consecutive hours or longer at night.    Time  8    Period  Weeks    Status  On-going      OT LONG TERM GOAL #3   Title  Pt will decrease LUE fascial restrictions to minimal amounts or less to improve mobility required for functional reaching tasks.    Time  8    Period  Weeks    Status  On-going      OT LONG TERM GOAL #4   Title  Pt will increase LUE A/ROM to [facility] to improve ability to reach overhead and behind back for dressing and bathing tasks.    Time  8    Period  Weeks    Status  On-going      OT LONG TERM GOAL #5   Title  Pt will improve strength in LUE to 5/5 to improve ability to perform yoga poses and lift lightweight items at work.    Time  8    Period  Weeks    Status  On-going            Plan - 06/20/18 1027    Clinical Impression Statement  A:  Patient able to tolerate P/ROM within MD protocol parameters.  Patient able to complete P/ROM therapeutic exercises with good form and minimal cuing.    Body Structure / Function / Physical Skills  ADL;UE functional use;Fascial restriction;Pain;IADL;Scar mobility;ROM;Strength  Plan  P:  Continue passive stretching within protocol limitations, increase ease and fluidity with therapy ball stretch.       Patient will benefit from skilled therapeutic intervention in order to improve the following deficits and impairments:   Body Structure / Function / Physical Skills: ADL, UE functional use, Fascial restriction, Pain, IADL, Scar mobility, ROM, Strength       Visit Diagnosis: 1. Stiffness of left shoulder, not elsewhere classified    2. Acute pain of left shoulder   3. Other symptoms and signs involving the musculoskeletal system       Problem List Patient Active Problem List   Diagnosis Date Noted  . Tendinitis of both rotator cuffs 01/25/2017  . Generalized anxiety disorder 12/29/2014  . Chronic left shoulder pain 07/11/2013  . Insomnia 04/11/2013  . Atypical chest pain 01/17/2013  . Dyspnea 01/03/2013  . Esophageal reflux 05/30/2012  . Depression 05/30/2012  . Chronic cough 05/30/2012    Shirlean MylarBethany H. Murray, MHA, OTR/L [phone]  06/20/2018, 10:46 AM  [facility] [address] [place], [place], [address] Phone: [phone]   Fax:  [phone]  Name: Angel Silva MRN: [id number] Date of Birth: 06/23/1965

## 2018-06-21 ENCOUNTER — Encounter (HOSPITAL_COMMUNITY): Payer: Self-pay

## 2018-06-21 ENCOUNTER — Other Ambulatory Visit: Payer: Self-pay

## 2018-06-21 ENCOUNTER — Ambulatory Visit (HOSPITAL_COMMUNITY): Payer: BC Managed Care – PPO

## 2018-06-21 DIAGNOSIS — M25512 Pain in left shoulder: Secondary | ICD-10-CM

## 2018-06-21 DIAGNOSIS — R29898 Other symptoms and signs involving the musculoskeletal system: Secondary | ICD-10-CM

## 2018-06-21 DIAGNOSIS — M25612 Stiffness of left shoulder, not elsewhere classified: Secondary | ICD-10-CM

## 2018-06-21 NOTE — Therapy (Signed)
[place], [place], [address] Phone: [phone]   Fax:  [phone]  Occupational Therapy Treatment  Patient Details  Name: Angel Silva MRN: [id number] Date of Birth: [birth year] Referring Provider (OT): Dr. Douglass Rivers   Encounter Date: 06/21/2018  OT End of Session - 06/21/18 0844    Visit Number  3    Number of Visits  16    Date for OT Re-Evaluation  08/12/18   mini reassess on 7/9   Authorization Type  BCBS    OT Start Time  0817    OT Stop Time  0846    OT Time Calculation (min)  29 min    Activity Tolerance  Patient tolerated treatment well    Behavior During Therapy  [facility] for tasks assessed/performed       Past Medical History:  Diagnosis Date  . Abnormal Pap smear of cervix    over 30 yrs ago  . Anxiety   . Chronic joint pain   . Depression   . GDM (gestational diabetes mellitus)    History  . IBS (irritable bowel syndrome)   . Reflux     Past Surgical History:  Procedure Laterality Date  . ABDOMINAL HYSTERECTOMY  2002   TAH- Fibroids , endometriosis; ov retained  . BACK SURGERY  2005   L5-S1 Fusion  . CESAREAN SECTION  1987  . ESOPHAGOGASTRODUODENOSCOPY    . LAPAROSCOPIC CHOLECYSTECTOMY  1995  . MASTOPEXY  2012  . REDUCTION MAMMAPLASTY Bilateral   . TONSILLECTOMY    . TUBAL LIGATION  1993   BTSP    There were no vitals filed for this visit.  Subjective Assessment - 06/21/18 0836    Subjective   S: It's usually sore in the morning and later in the day.    Currently in Pain?  Yes    Pain Score  4     Pain Location  Shoulder    Pain Orientation  Left    Pain Descriptors / Indicators  Aching    Pain Type  Acute pain    Pain Radiating Towards  N/A    Pain Onset  1 to 4 weeks ago    Pain Frequency  Intermittent    Aggravating Factors   movement    Pain Relieving Factors  rest, ice, pain medication    Effect of Pain on Daily Activities  unable to use LUE for ADL tasks.         [facility] OT Assessment - 06/21/18 0836      Assessment   Medical Diagnosis  s/p left arthroscopic RC repair with mini open bicep tenodesis      Precautions   Precautions  Shoulder    Type of Shoulder Precautions  See protocol     Shoulder Interventions  Shoulder sling/immobilizer;Off for dressing/bathing/exercises               OT Treatments/Exercises (OP) - 06/21/18 7026      Exercises   Exercises  Shoulder      Shoulder Exercises: Supine   Protraction  PROM;10 reps    External Rotation  PROM;10 reps    Internal Rotation  PROM;10 reps    Flexion  PROM;10 reps    ABduction  PROM;10 reps      Shoulder Exercises: Seated   Extension  AROM;10 reps    Row  AROM;10 reps    Other Seated Exercises  scapular depression; A/ROM, 10X  Shoulder Exercises: Therapy Ball   Flexion  10 reps    ABduction  10 reps      Shoulder Exercises: ROM/Strengthening   Thumb Tacks  1' low level    Prot/Ret//Elev/Dep  1' elbows bent 90 degrees at side    Other ROM/Strengthening Exercises  bridging 10X      Manual Therapy   Manual Therapy  Myofascial release    Manual therapy comments  manual therapy completed seperately from all other interventions    Myofascial Release  myofascial release and manual stretching to left upper arm, scapular, and shoulder region to decrease pain and restrictions and improve pain free mobility and available range in left shoulder region               OT Short Term Goals - 06/19/18 1556      OT SHORT TERM GOAL #1   Title  Pt will be educated on HEP to improve mobility required for ADL and work tasks.    Time  4    Period  Weeks    Status  On-going    Target Date  07/13/18      OT SHORT TERM GOAL #2   Title  Pt will increase P/ROM of LUE to [facility] to improve ability to perform dressing tasks with minimal compensatory strategies.    Time  4    Period  Weeks    Status  On-going      OT SHORT TERM GOAL #3   Title  Pt will increase LUE  strength to 3-/5 to improve ability to reaching for items at waist level.    Time  4    Period  Weeks    Status  On-going        OT Long Term Goals - 06/19/18 1557      OT LONG TERM GOAL #1   Title  Pt will return to highest level of functioning and independence in ADL, work, and leisure tasks using LUE as non-dominant.    Time  8    Period  Weeks    Status  On-going      OT LONG TERM GOAL #2   Title  Pt will decrease pain in LUE to 2/10 or less to improve ability to sleep for 4 consecutive hours or longer at night.    Time  8    Period  Weeks    Status  On-going      OT LONG TERM GOAL #3   Title  Pt will decrease LUE fascial restrictions to minimal amounts or less to improve mobility required for functional reaching tasks.    Time  8    Period  Weeks    Status  On-going      OT LONG TERM GOAL #4   Title  Pt will increase LUE A/ROM to [facility] to improve ability to reach overhead and behind back for dressing and bathing tasks.    Time  8    Period  Weeks    Status  On-going      OT LONG TERM GOAL #5   Title  Pt will improve strength in LUE to 5/5 to improve ability to perform yoga poses and lift lightweight items at work.    Time  8    Period  Weeks    Status  On-going            Plan - 06/21/18 0847    Clinical Impression Statement  A: Patient able to achieve  140 degrees of passive flexion at last repetition this date. Added bridging, low level thumb tacks and pro/ret/elev/dep with elbows flexed at 90 degrees by her side. patient reports some pain/soreness during session although able to complete all exercises. VC for form and technique as needed. Moderate fascial restrictions noted mainly in upper trapezius region with manual techniques completed to address.    Body Structure / Function / Physical Skills  ADL;UE functional use;Fascial restriction;Pain;IADL;Scar mobility;ROM;Strength    Plan  P: Continue to follow protocol of passive ROM with range limitations. Work on  increasing external rotation to 40 degrees.    Consulted and Agree with Plan of Care  Patient       Patient will benefit from skilled therapeutic intervention in order to improve the following deficits and impairments:   Body Structure / Function / Physical Skills: ADL, UE functional use, Fascial restriction, Pain, IADL, Scar mobility, ROM, Strength       Visit Diagnosis: 1. Other symptoms and signs involving the musculoskeletal system   2. Acute pain of left shoulder   3. Stiffness of left shoulder, not elsewhere classified       Problem List Patient Active Problem List   Diagnosis Date Noted  . Tendinitis of both rotator cuffs 01/25/2017  . Generalized anxiety disorder 12/29/2014  . Chronic left shoulder pain 07/11/2013  . Insomnia 04/11/2013  . Atypical chest pain 01/17/2013  . Dyspnea 01/03/2013  . Esophageal reflux 05/30/2012  . Depression 05/30/2012  . Chronic cough 05/30/2012   Ailene Ravel, OTR/L,CBIS  [phone]  06/21/2018, 9:08 AM  [facility] [address] [place], [place], [address] Phone: [phone]   Fax:  [phone]  Name: Angel Silva MRN: [id number] Date of Birth: 24-Jan-1965

## 2018-06-26 ENCOUNTER — Other Ambulatory Visit: Payer: Self-pay

## 2018-06-26 ENCOUNTER — Encounter (HOSPITAL_COMMUNITY): Payer: Self-pay | Admitting: Occupational Therapy

## 2018-06-26 ENCOUNTER — Ambulatory Visit (HOSPITAL_COMMUNITY): Payer: BC Managed Care – PPO | Admitting: Occupational Therapy

## 2018-06-26 DIAGNOSIS — R29898 Other symptoms and signs involving the musculoskeletal system: Secondary | ICD-10-CM

## 2018-06-26 DIAGNOSIS — M25512 Pain in left shoulder: Secondary | ICD-10-CM

## 2018-06-26 DIAGNOSIS — M25612 Stiffness of left shoulder, not elsewhere classified: Secondary | ICD-10-CM

## 2018-06-26 NOTE — Therapy (Signed)
[place], [place], [address] Phone: [phone]   Fax:  [phone]  Occupational Therapy Treatment  Patient Details  Name: Angel Silva MRN: [id number] Date of Birth: 11/06/1965 Referring Provider (OT): Dr. Douglass Rivers   Encounter Date: 06/26/2018  OT End of Session - 06/26/18 1504    Visit Number  4    Number of Visits  16    Date for OT Re-Evaluation  08/12/18   mini reassess on 7/9   Authorization Type  BCBS    OT Start Time  1419    OT Stop Time  1500    OT Time Calculation (min)  41 min    Activity Tolerance  Patient tolerated treatment well    Behavior During Therapy  [facility] for tasks assessed/performed       Past Medical History:  Diagnosis Date  . Abnormal Pap smear of cervix    over 30 yrs ago  . Anxiety   . Chronic joint pain   . Depression   . GDM (gestational diabetes mellitus)    History  . IBS (irritable bowel syndrome)   . Reflux     Past Surgical History:  Procedure Laterality Date  . ABDOMINAL HYSTERECTOMY  2002   TAH- Fibroids , endometriosis; ov retained  . BACK SURGERY  2005   L5-S1 Fusion  . CESAREAN SECTION  1987  . ESOPHAGOGASTRODUODENOSCOPY    . LAPAROSCOPIC CHOLECYSTECTOMY  1995  . MASTOPEXY  2012  . REDUCTION MAMMAPLASTY Bilateral   . TONSILLECTOMY    . TUBAL LIGATION  1993   BTSP    There were no vitals filed for this visit.  Subjective Assessment - 06/26/18 1419    Subjective   S: I'm very pleased so far.    Currently in Pain?  No/denies         [facility] OT Assessment - 06/26/18 1419      Assessment   Medical Diagnosis  s/p left arthroscopic RC repair with mini open bicep tenodesis      Precautions   Precautions  Shoulder    Type of Shoulder Precautions  See protocol     Shoulder Interventions  Shoulder sling/immobilizer;Off for dressing/bathing/exercises               OT Treatments/Exercises (OP) - 06/26/18 1423      Exercises   Exercises  Shoulder      Shoulder Exercises: Supine   Protraction  PROM;10 reps    External Rotation  PROM;10 reps    Internal Rotation  PROM;10 reps    Flexion  PROM;10 reps    ABduction  PROM;10 reps      Shoulder Exercises: Seated   Elevation  AROM;10 reps    Extension  AROM;10 reps    Row  AROM;10 reps    Other Seated Exercises  scapular depression; A/ROM, 10X      Shoulder Exercises: Therapy Ball   Flexion  10 reps    ABduction  10 reps      Shoulder Exercises: ROM/Strengthening   Thumb Tacks  1' low level    Prot/Ret//Elev/Dep  1' elbows bent 90 degrees at side      Manual Therapy   Manual Therapy  Myofascial release    Manual therapy comments  manual therapy completed seperately from all other interventions    Myofascial Release  myofascial release and manual stretching to left upper arm, scapular, and shoulder region to decrease pain and restrictions and improve  pain free mobility and available range in left shoulder region             OT Education - 06/26/18 1447    Education Details  scapular A/ROM    Person(s) Educated  Patient    Methods  Explanation;Demonstration;Handout    Comprehension  Verbalized understanding;Returned demonstration       OT Short Term Goals - 06/19/18 1556      OT SHORT TERM GOAL #1   Title  Pt will be educated on HEP to improve mobility required for ADL and work tasks.    Time  4    Period  Weeks    Status  On-going    Target Date  07/13/18      OT SHORT TERM GOAL #2   Title  Pt will increase P/ROM of LUE to [facility] to improve ability to perform dressing tasks with minimal compensatory strategies.    Time  4    Period  Weeks    Status  On-going      OT SHORT TERM GOAL #3   Title  Pt will increase LUE strength to 3-/5 to improve ability to reaching for items at waist level.    Time  4    Period  Weeks    Status  On-going        OT Long Term Goals - 06/19/18 1557      OT LONG TERM GOAL #1   Title  Pt will return to  highest level of functioning and independence in ADL, work, and leisure tasks using LUE as non-dominant.    Time  8    Period  Weeks    Status  On-going      OT LONG TERM GOAL #2   Title  Pt will decrease pain in LUE to 2/10 or less to improve ability to sleep for 4 consecutive hours or longer at night.    Time  8    Period  Weeks    Status  On-going      OT LONG TERM GOAL #3   Title  Pt will decrease LUE fascial restrictions to minimal amounts or less to improve mobility required for functional reaching tasks.    Time  8    Period  Weeks    Status  On-going      OT LONG TERM GOAL #4   Title  Pt will increase LUE A/ROM to [facility] to improve ability to reach overhead and behind back for dressing and bathing tasks.    Time  8    Period  Weeks    Status  On-going      OT LONG TERM GOAL #5   Title  Pt will improve strength in LUE to 5/5 to improve ability to perform yoga poses and lift lightweight items at work.    Time  8    Period  Weeks    Status  On-going            Plan - 06/26/18 1504    Clinical Impression Statement  A: Pt reporting she is very pleased with her progress thus far. Pt presents today without sling, reports she wears it most of the time but not always. Reiterated importance of sling wear for healing and advised on not reaching for items with LUE, pt verbalized understanding. Continued with P/ROM working towards improved er, continues to have increased pain and tightness with er, able to reach allowed ROM with flexion and abduction. Pt is going on  vacation and will miss approximately 2 weeks of appointments, educated on HEP and added scapular A/ROM, thumb tacks, and prot/ret/elev/dep to HEP.    Body Structure / Function / Physical Skills  ADL;UE functional use;Fascial restriction;Pain;IADL;Scar mobility;ROM;Strength    Plan  P: Follow up on HEP, continue working to improve er during passive stretching. Resume bridges       Patient will benefit from skilled  therapeutic intervention in order to improve the following deficits and impairments:   Body Structure / Function / Physical Skills: ADL, UE functional use, Fascial restriction, Pain, IADL, Scar mobility, ROM, Strength       Visit Diagnosis: 1. Other symptoms and signs involving the musculoskeletal system   2. Acute pain of left shoulder   3. Stiffness of left shoulder, not elsewhere classified       Problem List Patient Active Problem List   Diagnosis Date Noted  . Tendinitis of both rotator cuffs 01/25/2017  . Generalized anxiety disorder 12/29/2014  . Chronic left shoulder pain 07/11/2013  . Insomnia 04/11/2013  . Atypical chest pain 01/17/2013  . Dyspnea 01/03/2013  . Esophageal reflux 05/30/2012  . Depression 05/30/2012  . Chronic cough 05/30/2012   Guadelupe Sabin, OTR/L  [phone] 06/26/2018, 3:07 PM  [facility] [address] [place], [place], [address] Phone: [phone]   Fax:  [phone]  Name: Oveda Dadamo MRN: [id number] Date of Birth: [birth year]

## 2018-06-26 NOTE — Patient Instructions (Signed)
1) Seated Row   Sit up straight with elbows by your sides. Pull back with shoulders/elbows, keeping forearms straight, as if pulling back on the reins of a horse. Squeeze shoulder blades together. Repeat _10__times, __2-3__sets/day    2) Shoulder Elevation    Sit up straight with arms by your sides. Slowly bring your shoulders up towards your ears. Repeat_10__times, _2-3___ sets/day    3) Shoulder Extension    Sit up straight with both arms by your side, draw your arms back behind your waist. Keep your elbows straight. Repeat __10__times, _2-3___sets/day.        

## 2018-06-28 ENCOUNTER — Encounter (HOSPITAL_COMMUNITY): Payer: Self-pay | Admitting: Occupational Therapy

## 2018-06-28 ENCOUNTER — Ambulatory Visit: Payer: BLUE CROSS/BLUE SHIELD | Admitting: Family Medicine

## 2018-06-28 ENCOUNTER — Other Ambulatory Visit: Payer: Self-pay

## 2018-06-28 ENCOUNTER — Ambulatory Visit (HOSPITAL_COMMUNITY): Payer: BC Managed Care – PPO | Admitting: Occupational Therapy

## 2018-06-28 DIAGNOSIS — M25512 Pain in left shoulder: Secondary | ICD-10-CM | POA: Diagnosis not present

## 2018-06-28 DIAGNOSIS — R29898 Other symptoms and signs involving the musculoskeletal system: Secondary | ICD-10-CM

## 2018-06-28 DIAGNOSIS — M25612 Stiffness of left shoulder, not elsewhere classified: Secondary | ICD-10-CM

## 2018-06-28 NOTE — Therapy (Signed)
[place], [place], [address] Phone: [phone]   Fax:  [phone]  Occupational Therapy Treatment  Patient Details  Name: Angel Silva MRN: [id number] Date of Birth: Jun 06, 1965 Referring Provider (OT): Dr. Douglass Rivers   Encounter Date: 06/28/2018  OT End of Session - 06/28/18 1054    Visit Number  5    Number of Visits  16    Date for OT Re-Evaluation  08/12/18   mini reassess on 7/9   Authorization Type  BCBS    OT Start Time  1018    OT Stop Time  1056    OT Time Calculation (min)  38 min    Activity Tolerance  Patient tolerated treatment well    Behavior During Therapy  [facility] for tasks assessed/performed       Past Medical History:  Diagnosis Date  . Abnormal Pap smear of cervix    over 30 yrs ago  . Anxiety   . Chronic joint pain   . Depression   . GDM (gestational diabetes mellitus)    History  . IBS (irritable bowel syndrome)   . Reflux     Past Surgical History:  Procedure Laterality Date  . ABDOMINAL HYSTERECTOMY  2002   TAH- Fibroids , endometriosis; ov retained  . BACK SURGERY  2005   L5-S1 Fusion  . CESAREAN SECTION  1987  . ESOPHAGOGASTRODUODENOSCOPY    . LAPAROSCOPIC CHOLECYSTECTOMY  1995  . MASTOPEXY  2012  . REDUCTION MAMMAPLASTY Bilateral   . TONSILLECTOMY    . TUBAL LIGATION  1993   BTSP    There were no vitals filed for this visit.  Subjective Assessment - 06/28/18 1018    Subjective   S: It's been really sore.    Currently in Pain?  Yes    Pain Score  3     Pain Location  Shoulder    Pain Orientation  Left    Pain Descriptors / Indicators  Sore;Aching    Pain Type  Acute pain    Pain Radiating Towards  n/a    Pain Onset  Yesterday    Pain Frequency  Intermittent    Aggravating Factors   movement    Pain Relieving Factors  rest, ice, pain medication    Effect of Pain on Daily Activities  unable to use LUE for ADL tasks.    Multiple Pain Sites  No          [facility] OT Assessment - 06/28/18 1018      Assessment   Medical Diagnosis  s/p left arthroscopic RC repair with mini open bicep tenodesis      Precautions   Precautions  Shoulder    Type of Shoulder Precautions  See protocol     Shoulder Interventions  Shoulder sling/immobilizer;Off for dressing/bathing/exercises               OT Treatments/Exercises (OP) - 06/28/18 1020      Exercises   Exercises  Shoulder      Shoulder Exercises: Supine   Protraction  PROM;10 reps    External Rotation  PROM;10 reps    Internal Rotation  PROM;10 reps    Flexion  PROM;10 reps    ABduction  PROM;10 reps      Shoulder Exercises: Seated   Elevation  AROM;15 reps    Extension  AROM;15 reps    Row  AROM;15 reps    Other Seated Exercises  scapular depression; A/ROM, 15X  Shoulder Exercises: Therapy Ball   Flexion  10 reps    ABduction  10 reps      Shoulder Exercises: ROM/Strengthening   Thumb Tacks  1' low level    Prot/Ret//Elev/Dep  1' elbows bent 90 degrees at side, resting on countertop    Other ROM/Strengthening Exercises  bridging 10X      Manual Therapy   Manual Therapy  Myofascial release    Manual therapy comments  manual therapy completed seperately from all other interventions    Myofascial Release  myofascial release and manual stretching to left upper arm, scapular, and shoulder region to decrease pain and restrictions and improve pain free mobility and available range in left shoulder region               OT Short Term Goals - 06/19/18 1556      OT SHORT TERM GOAL #1   Title  Pt will be educated on HEP to improve mobility required for ADL and work tasks.    Time  4    Period  Weeks    Status  On-going    Target Date  07/13/18      OT SHORT TERM GOAL #2   Title  Pt will increase P/ROM of LUE to [facility] to improve ability to perform dressing tasks with minimal compensatory strategies.    Time  4    Period  Weeks    Status  On-going      OT  SHORT TERM GOAL #3   Title  Pt will increase LUE strength to 3-/5 to improve ability to reaching for items at waist level.    Time  4    Period  Weeks    Status  On-going        OT Long Term Goals - 06/19/18 1557      OT LONG TERM GOAL #1   Title  Pt will return to highest level of functioning and independence in ADL, work, and leisure tasks using LUE as non-dominant.    Time  8    Period  Weeks    Status  On-going      OT LONG TERM GOAL #2   Title  Pt will decrease pain in LUE to 2/10 or less to improve ability to sleep for 4 consecutive hours or longer at night.    Time  8    Period  Weeks    Status  On-going      OT LONG TERM GOAL #3   Title  Pt will decrease LUE fascial restrictions to minimal amounts or less to improve mobility required for functional reaching tasks.    Time  8    Period  Weeks    Status  On-going      OT LONG TERM GOAL #4   Title  Pt will increase LUE A/ROM to [facility] to improve ability to reach overhead and behind back for dressing and bathing tasks.    Time  8    Period  Weeks    Status  On-going      OT LONG TERM GOAL #5   Title  Pt will improve strength in LUE to 5/5 to improve ability to perform yoga poses and lift lightweight items at work.    Time  8    Period  Weeks    Status  On-going            Plan - 06/28/18 1047    Clinical Impression Statement  A: Continued  with manual therapy this session working on reducing fascial restrictions. Scar looks improved today with less puckering. Continued with passive stretching, pt able to achieve er to approximately 20 degrees today. Increased scapular ROM to 15 repetitions. Verbal cuing for form and technique during session.    Body Structure / Function / Physical Skills  ADL;UE functional use;Fascial restriction;Pain;IADL;Scar mobility;ROM;Strength    Plan  P: Continue working towards 40 degrees of er. Increase therapy ball reps to 15       Patient will benefit from skilled therapeutic  intervention in order to improve the following deficits and impairments:   Body Structure / Function / Physical Skills: ADL, UE functional use, Fascial restriction, Pain, IADL, Scar mobility, ROM, Strength       Visit Diagnosis: 1. Other symptoms and signs involving the musculoskeletal system   2. Acute pain of left shoulder   3. Stiffness of left shoulder, not elsewhere classified       Problem List Patient Active Problem List   Diagnosis Date Noted  . Tendinitis of both rotator cuffs 01/25/2017  . Generalized anxiety disorder 12/29/2014  . Chronic left shoulder pain 07/11/2013  . Insomnia 04/11/2013  . Atypical chest pain 01/17/2013  . Dyspnea 01/03/2013  . Esophageal reflux 05/30/2012  . Depression 05/30/2012  . Chronic cough 05/30/2012   Guadelupe Sabin, OTR/L  [phone] 06/28/2018, 10:58 AM  [place], [place], [address] Phone: [phone]   Fax:  [phone]  Name: Yocelyn Brocious MRN: [id number] Date of Birth: 1965/07/25

## 2018-07-01 ENCOUNTER — Telehealth: Payer: Self-pay | Admitting: Family Medicine

## 2018-07-01 NOTE — Telephone Encounter (Signed)
Ok six ref 

## 2018-07-01 NOTE — Telephone Encounter (Signed)
Patient is requesting refill on tramadol 50 mg called into [facility]. She was last seen 06/10/2018 and last filled 05/02/2018

## 2018-07-01 NOTE — Telephone Encounter (Signed)
Please advise. Thank you

## 2018-07-01 NOTE — Telephone Encounter (Signed)
Pt requesting refill on traZODone (DESYREL) 50 MG tablet    NOT tramadol

## 2018-07-02 ENCOUNTER — Other Ambulatory Visit: Payer: Self-pay

## 2018-07-02 ENCOUNTER — Ambulatory Visit (INDEPENDENT_AMBULATORY_CARE_PROVIDER_SITE_OTHER): Payer: BC Managed Care – PPO | Admitting: Family Medicine

## 2018-07-02 ENCOUNTER — Ambulatory Visit (HOSPITAL_COMMUNITY): Payer: BC Managed Care – PPO | Admitting: Specialist

## 2018-07-02 DIAGNOSIS — F5101 Primary insomnia: Secondary | ICD-10-CM

## 2018-07-02 MED ORDER — TRAMADOL HCL 50 MG PO TABS: 50.0000 mg | ORAL_TABLET | Freq: Four times a day (QID) | ORAL | 5 refills | Status: DC | PRN

## 2018-07-02 MED ORDER — TRAZODONE HCL 50 MG PO TABS: 50.0000 mg | ORAL_TABLET | Freq: Every evening | ORAL | 5 refills | Status: DC | PRN

## 2018-07-02 MED ORDER — CLONAZEPAM 1 MG PO TABS: 1.0000 mg | ORAL_TABLET | Freq: Every day | ORAL | 5 refills | Status: DC | PRN

## 2018-07-02 NOTE — Telephone Encounter (Signed)
Pt is requesting Trazadone not Tramadol. Please advise. Pt would like this sent to [facility] in [place], [place] on [address]. Thank you

## 2018-07-02 NOTE — Telephone Encounter (Signed)
Trazodone sent to [facility] in [place]. Pt is aware

## 2018-07-02 NOTE — Telephone Encounter (Signed)
Ok to [place] six mo worth

## 2018-07-02 NOTE — Progress Notes (Signed)
   Subjective:  Audio only  Patient ID: Angel Silva, female    DOB: Feb 10, 1965, 53 y.o.   MRN: [id number]  Anxiety Presents for follow-up visit.    takes klonopin for sleep. Pt states it is working well. Needs refills.   Pt states no concerns today.   Virtual Visit via Telephone Note  I connected with Angel Silva on 07/02/18 at  3:00 PM EDT by telephone and verified that I am speaking with the correct person using two identifiers.  Location: Patient: home Provider: office   I discussed the limitations, risks, security and privacy concerns of performing an evaluation and management service by telephone and the availability of in person appointments. I also discussed with the patient that there may be a patient responsible charge related to this service. The patient expressed understanding and agreed to proceed.   History of Present Illness:    Observations/Objective:   Assessment and Plan:   Follow Up Instructions:    I discussed the assessment and treatment plan with the patient. The patient was provided an opportunity to ask questions and all were answered. The patient agreed with the plan and demonstrated an understanding of the instructions.   The patient was advised to call back or seek an in-person evaluation if the symptoms worsen or if the condition fails to improve as anticipated.  I provided  minutes of non-face-to-face time during this encounter.   Patient compliant with insomnia medication. Generally takes most nights. No obvious morning drowsiness. Definitely helps patient sleep. Without it patient states would not get a good nights rest.     Review of Systems No headache, no major weight loss or weight gain, no chest pain no back pain abdominal pain no change in bowel habits complete ROS otherwise negative     Objective:   Physical Exam  Virtual      Assessment & Plan:  Impression chronic insomnia with element of anxiety.  Patient uses a  combination of trazodone and benzodiazepine with overall good success.  Will refill 6 months worth of exercise encouraged

## 2018-07-03 ENCOUNTER — Encounter (HOSPITAL_COMMUNITY): Payer: BC Managed Care – PPO

## 2018-07-04 ENCOUNTER — Encounter (HOSPITAL_COMMUNITY): Payer: BC Managed Care – PPO | Admitting: Occupational Therapy

## 2018-07-09 ENCOUNTER — Telehealth (HOSPITAL_COMMUNITY): Payer: Self-pay | Admitting: Occupational Therapy

## 2018-07-09 NOTE — Telephone Encounter (Signed)
L/m to cx 07/10/2018 apptment she will be here Thurs- no reason given for cx-lation

## 2018-07-10 ENCOUNTER — Ambulatory Visit (HOSPITAL_COMMUNITY): Payer: BC Managed Care – PPO

## 2018-07-11 ENCOUNTER — Encounter (HOSPITAL_COMMUNITY): Payer: Self-pay | Admitting: Occupational Therapy

## 2018-07-11 ENCOUNTER — Ambulatory Visit (HOSPITAL_COMMUNITY): Payer: BC Managed Care – PPO | Attending: Orthopaedic Surgery | Admitting: Occupational Therapy

## 2018-07-11 ENCOUNTER — Other Ambulatory Visit: Payer: Self-pay

## 2018-07-11 DIAGNOSIS — R29898 Other symptoms and signs involving the musculoskeletal system: Secondary | ICD-10-CM | POA: Diagnosis not present

## 2018-07-11 DIAGNOSIS — M25512 Pain in left shoulder: Secondary | ICD-10-CM | POA: Insufficient documentation

## 2018-07-11 DIAGNOSIS — M25612 Stiffness of left shoulder, not elsewhere classified: Secondary | ICD-10-CM

## 2018-07-11 NOTE — Therapy (Signed)
[place], [place], [address] Phone: [phone]   Fax:  [phone]  Occupational Therapy Treatment  Patient Details  Name: Angel Silva MRN: [id number] Date of Birth: [birth year] Referring Provider (OT): Dr. Douglass Rivers   Encounter Date: 07/11/2018  OT End of Session - 07/11/18 1646    Visit Number  6    Number of Visits  16    Date for OT Re-Evaluation  08/12/18   mini reassess on 7/9   Authorization Type  BCBS    OT Start Time  2706    OT Stop Time  1649    OT Time Calculation (min)  32 min    Activity Tolerance  Patient tolerated treatment well    Behavior During Therapy  [facility] for tasks assessed/performed       Past Medical History:  Diagnosis Date  . Abnormal Pap smear of cervix    over 30 yrs ago  . Anxiety   . Chronic joint pain   . Depression   . GDM (gestational diabetes mellitus)    History  . IBS (irritable bowel syndrome)   . Reflux     Past Surgical History:  Procedure Laterality Date  . ABDOMINAL HYSTERECTOMY  2002   TAH- Fibroids , endometriosis; ov retained  . BACK SURGERY  2005   L5-S1 Fusion  . CESAREAN SECTION  1987  . ESOPHAGOGASTRODUODENOSCOPY    . LAPAROSCOPIC CHOLECYSTECTOMY  1995  . MASTOPEXY  2012  . REDUCTION MAMMAPLASTY Bilateral   . TONSILLECTOMY    . TUBAL LIGATION  1993   BTSP    There were no vitals filed for this visit.  Subjective Assessment - 07/11/18 1617    Subjective   S: I've been using my arm some and it's a little sore.    Currently in Pain?  Yes    Pain Score  2     Pain Location  Shoulder    Pain Orientation  Left    Pain Descriptors / Indicators  Aching;Sore    Pain Type  Acute pain    Pain Radiating Towards  n/a    Pain Onset  In the past 7 days    Pain Frequency  Intermittent    Aggravating Factors   movement    Pain Relieving Factors  rest, ice, pain medication    Effect of Pain on Daily Activities  mod effec on ADLs          [facility] OT Assessment - 07/11/18 1616      Assessment   Medical Diagnosis  s/p left arthroscopic RC repair with mini open bicep tenodesis    Prior Therapy  None      Precautions   Precautions  Shoulder    Type of Shoulder Precautions  See protocol     Shoulder Interventions  Shoulder sling/immobilizer;Off for dressing/bathing/exercises               OT Treatments/Exercises (OP) - 07/11/18 1619      Exercises   Exercises  Shoulder      Shoulder Exercises: Supine   Protraction  PROM;10 reps    Horizontal ABduction  PROM;10 reps    External Rotation  PROM;10 reps    Internal Rotation  PROM;10 reps    Flexion  PROM;10 reps    ABduction  PROM;10 reps      Shoulder Exercises: Seated   Elevation  AROM;15 reps    Extension  AROM;15 reps  Row  AROM;15 reps    Other Seated Exercises  scapular depression; A/ROM, 15X      Shoulder Exercises: Therapy Ball   Flexion  15 reps    ABduction  15 reps      Shoulder Exercises: ROM/Strengthening   Thumb Tacks  1' low level    Prot/Ret//Elev/Dep  1' elbows bent 90 degrees at side, resting on countertop      Manual Therapy   Manual Therapy  Myofascial release    Manual therapy comments  manual therapy completed seperately from all other interventions    Myofascial Release  myofascial release and manual stretching to left upper arm, scapular, and shoulder region to decrease pain and restrictions and improve pain free mobility and available range in left shoulder region               OT Short Term Goals - 06/19/18 1556      OT SHORT TERM GOAL #1   Title  Pt will be educated on HEP to improve mobility required for ADL and work tasks.    Time  4    Period  Weeks    Status  On-going    Target Date  07/13/18      OT SHORT TERM GOAL #2   Title  Pt will increase P/ROM of LUE to [facility] to improve ability to perform dressing tasks with minimal compensatory strategies.    Time  4    Period  Weeks    Status  On-going       OT SHORT TERM GOAL #3   Title  Pt will increase LUE strength to 3-/5 to improve ability to reaching for items at waist level.    Time  4    Period  Weeks    Status  On-going        OT Long Term Goals - 06/19/18 1557      OT LONG TERM GOAL #1   Title  Pt will return to highest level of functioning and independence in ADL, work, and leisure tasks using LUE as non-dominant.    Time  8    Period  Weeks    Status  On-going      OT LONG TERM GOAL #2   Title  Pt will decrease pain in LUE to 2/10 or less to improve ability to sleep for 4 consecutive hours or longer at night.    Time  8    Period  Weeks    Status  On-going      OT LONG TERM GOAL #3   Title  Pt will decrease LUE fascial restrictions to minimal amounts or less to improve mobility required for functional reaching tasks.    Time  8    Period  Weeks    Status  On-going      OT LONG TERM GOAL #4   Title  Pt will increase LUE A/ROM to [facility] to improve ability to reach overhead and behind back for dressing and bathing tasks.    Time  8    Period  Weeks    Status  On-going      OT LONG TERM GOAL #5   Title  Pt will improve strength in LUE to 5/5 to improve ability to perform yoga poses and lift lightweight items at work.    Time  8    Period  Weeks    Status  On-going            Plan -  07/11/18 1649    Clinical Impression Statement  A: Pt returns to therapy after vacation, pt reporting shoulder did great and she had minimal pain-kept arm propped up on pillow during long car ride. Continue to follow protocol with P/ROM only, pt able to achieve all set ROM limits only experiencing pain with er. Pt with good form during all exercises, occasional verbal cuing for technique.    Body Structure / Function / Physical Skills  ADL;UE functional use;Fascial restriction;Pain;IADL;Scar mobility;ROM;Strength    Plan  P: Progress to next phase of protocol adding AA/ROM and complete passive stretching to tolerance. Measure for  MD appt       Patient will benefit from skilled therapeutic intervention in order to improve the following deficits and impairments:   Body Structure / Function / Physical Skills: ADL, UE functional use, Fascial restriction, Pain, IADL, Scar mobility, ROM, Strength       Visit Diagnosis: 1. Other symptoms and signs involving the musculoskeletal system   2. Acute pain of left shoulder   3. Stiffness of left shoulder, not elsewhere classified       Problem List Patient Active Problem List   Diagnosis Date Noted  . Tendinitis of both rotator cuffs 01/25/2017  . Generalized anxiety disorder 12/29/2014  . Chronic left shoulder pain 07/11/2013  . Insomnia 04/11/2013  . Atypical chest pain 01/17/2013  . Dyspnea 01/03/2013  . Esophageal reflux 05/30/2012  . Depression 05/30/2012  . Chronic cough 05/30/2012   Guadelupe Sabin, OTR/L  [phone] 07/11/2018, 4:52 PM  [place] [address] [place], [place], [address] Phone: [phone]   Fax:  [phone]  Name: Angel Silva MRN: [id number] Date of Birth: [birth year]

## 2018-07-17 ENCOUNTER — Other Ambulatory Visit: Payer: Self-pay

## 2018-07-17 ENCOUNTER — Ambulatory Visit (HOSPITAL_COMMUNITY): Payer: BC Managed Care – PPO | Admitting: Occupational Therapy

## 2018-07-17 ENCOUNTER — Encounter (HOSPITAL_COMMUNITY): Payer: Self-pay | Admitting: Occupational Therapy

## 2018-07-17 DIAGNOSIS — M25512 Pain in left shoulder: Secondary | ICD-10-CM

## 2018-07-17 DIAGNOSIS — R29898 Other symptoms and signs involving the musculoskeletal system: Secondary | ICD-10-CM | POA: Diagnosis not present

## 2018-07-17 DIAGNOSIS — M25612 Stiffness of left shoulder, not elsewhere classified: Secondary | ICD-10-CM

## 2018-07-17 NOTE — Therapy (Signed)
[place], [place], [address] Phone: [phone]   Fax:  [phone]  Occupational Therapy Treatment (mini-reassessment)  Patient Details  Name: Angel Silva MRN: [id number] Date of Birth: [birth year] Referring Provider (OT): Dr. Douglass Rivers   Encounter Date: 07/17/2018  OT End of Session - 07/17/18 1559    Visit Number  7    Number of Visits  16    Date for OT Re-Evaluation  08/12/18    Authorization Type  BCBS    OT Start Time  5883    OT Stop Time  1558    OT Time Calculation (min)  42 min    Activity Tolerance  Patient tolerated treatment well    Behavior During Therapy  [facility] for tasks assessed/performed       Past Medical History:  Diagnosis Date  . Abnormal Pap smear of cervix    over 30 yrs ago  . Anxiety   . Chronic joint pain   . Depression   . GDM (gestational diabetes mellitus)    History  . IBS (irritable bowel syndrome)   . Reflux     Past Surgical History:  Procedure Laterality Date  . ABDOMINAL HYSTERECTOMY  2002   TAH- Fibroids , endometriosis; ov retained  . BACK SURGERY  2005   L5-S1 Fusion  . CESAREAN SECTION  1987  . ESOPHAGOGASTRODUODENOSCOPY    . LAPAROSCOPIC CHOLECYSTECTOMY  1995  . MASTOPEXY  2012  . REDUCTION MAMMAPLASTY Bilateral   . TONSILLECTOMY    . TUBAL LIGATION  1993   BTSP    There were no vitals filed for this visit.  Subjective Assessment - 07/17/18 1516    Subjective   S: I've been doing laundry today.    Currently in Pain?  Yes    Pain Score  3     Pain Location  Shoulder    Pain Orientation  Left    Pain Descriptors / Indicators  Aching;Sore    Pain Type  Acute pain    Pain Radiating Towards  n/a    Pain Onset  In the past 7 days    Pain Frequency  Intermittent    Aggravating Factors   movement    Pain Relieving Factors  rest, heating pad    Effect of Pain on Daily Activities  mod effect on ADLs         [facility] OT Assessment - 07/17/18  1516      Assessment   Medical Diagnosis  s/p left arthroscopic RC repair with mini open bicep tenodesis      Precautions   Precautions  Shoulder    Type of Shoulder Precautions  See protocol     Shoulder Interventions  Shoulder sling/immobilizer;Off for dressing/bathing/exercises      Palpation   Palpation comment  Minimal fascial restrictions along upper arm, trapezius, and scapularis regions      AROM   Overall AROM Comments  Assessed seated, er/IR adducted    AROM Assessment Site  Shoulder    Right/Left Shoulder  Left    Left Shoulder Flexion  111 Degrees   not previously assessed   Left Shoulder ABduction  70 Degrees   not previously assessed   Left Shoulder Internal Rotation  90 Degrees   not previously assessed   Left Shoulder External Rotation  55 Degrees   not previously assessed     PROM   Overall PROM Comments  Assessed seated, er/IR adducted  PROM Assessment Site  Shoulder    Right/Left Shoulder  Left    Left Shoulder Flexion  170 Degrees   98 previous   Left Shoulder ABduction  180 Degrees   61 previous   Left Shoulder Internal Rotation  90 Degrees   same as previous   Left Shoulder External Rotation  44 Degrees   0 previous     Strength   Overall Strength Comments  Assessed via observation not formal MMT, er/IR adducted    Strength Assessment Site  Shoulder    Right/Left Shoulder  Left    Left Shoulder Flexion  3-/5   not previously assessed   Left Shoulder ABduction  3-/5   not previously assessed   Left Shoulder Internal Rotation  3/5   not previously assessed   Left Shoulder External Rotation  3/5   not previously assessed              OT Treatments/Exercises (OP) - 07/17/18 1519      Exercises   Exercises  Shoulder      Shoulder Exercises: Supine   Protraction  PROM;5 reps;AAROM;10 reps    Horizontal ABduction  PROM;5 reps;AAROM;10 reps    External Rotation  PROM;5 reps;AAROM;10 reps    Internal Rotation  PROM;5 reps;AAROM;10  reps    Flexion  PROM;5 reps;AAROM;10 reps    ABduction  PROM;5 reps;AAROM;10 reps      Shoulder Exercises: Standing   Protraction  AAROM;10 reps    Horizontal ABduction  AAROM;10 reps    External Rotation  AAROM;10 reps    Internal Rotation  AAROM;10 reps    Flexion  AAROM;10 reps    ABduction  AAROM;10 reps      Manual Therapy   Manual Therapy  Myofascial release    Manual therapy comments  manual therapy completed seperately from all other interventions    Myofascial Release  myofascial release and manual stretching to left upper arm, scapular, and shoulder region to decrease pain and restrictions and improve pain free mobility and available range in left shoulder region             OT Education - 07/17/18 1537    Education Details  shoulder AA/ROM    Person(s) Educated  Patient    Methods  Explanation;Demonstration;Handout    Comprehension  Verbalized understanding;Returned demonstration       OT Short Term Goals - 07/17/18 1551      OT SHORT TERM GOAL #1   Title  Pt will be educated on HEP to improve mobility required for ADL and work tasks.    Time  4    Period  Weeks    Status  On-going    Target Date  07/13/18      OT SHORT TERM GOAL #2   Title  Pt will increase P/ROM of LUE to [facility] to improve ability to perform dressing tasks with minimal compensatory strategies.    Time  4    Period  Weeks    Status  Achieved      OT SHORT TERM GOAL #3   Title  Pt will increase LUE strength to 3-/5 to improve ability to reaching for items at waist level.    Time  4    Period  Weeks    Status  Achieved        OT Long Term Goals - 07/17/18 1551      OT LONG TERM GOAL #1   Title  Pt will return to  highest level of functioning and independence in ADL, work, and leisure tasks using LUE as non-dominant.    Time  8    Period  Weeks    Status  On-going      OT LONG TERM GOAL #2   Title  Pt will decrease pain in LUE to 2/10 or less to improve ability to sleep for 4  consecutive hours or longer at night.    Time  8    Period  Weeks    Status  On-going      OT LONG TERM GOAL #3   Title  Pt will decrease LUE fascial restrictions to minimal amounts or less to improve mobility required for functional reaching tasks.    Time  8    Period  Weeks    Status  Achieved      OT LONG TERM GOAL #4   Title  Pt will increase LUE A/ROM to [facility] to improve ability to reach overhead and behind back for dressing and bathing tasks.    Time  8    Period  Weeks    Status  On-going      OT LONG TERM GOAL #5   Title  Pt will improve strength in LUE to 5/5 to improve ability to perform yoga poses and lift lightweight items at work.    Time  8    Period  Weeks    Status  On-going            Plan - 07/17/18 1552    Clinical Impression Statement  A: Mini-reassessment completed this session, pt has met 2/3 STGs and 1/5 LTGs thus far. Pt is demonstrating full passive ROM and is steadily working towards improving A/ROM and strength. Progressed to AA/ROM per protocol today. Pt performing in supine and standing, occasional soreness, rest breaks provided as needed. Verbal cuing for form and technique.    Body Structure / Function / Physical Skills  ADL;UE functional use;Fascial restriction;Pain;IADL;Scar mobility;ROM;Strength    Plan  P: Follow up on HEP and MD appt. Add wall wash and complete thumb tacks at shoulder height       Patient will benefit from skilled therapeutic intervention in order to improve the following deficits and impairments:   Body Structure / Function / Physical Skills: ADL, UE functional use, Fascial restriction, Pain, IADL, Scar mobility, ROM, Strength       Visit Diagnosis: 1. Other symptoms and signs involving the musculoskeletal system   2. Acute pain of left shoulder   3. Stiffness of left shoulder, not elsewhere classified       Problem List Patient Active Problem List   Diagnosis Date Noted  . Tendinitis of both rotator cuffs  01/25/2017  . Generalized anxiety disorder 12/29/2014  . Chronic left shoulder pain 07/11/2013  . Insomnia 04/11/2013  . Atypical chest pain 01/17/2013  . Dyspnea 01/03/2013  . Esophageal reflux 05/30/2012  . Depression 05/30/2012  . Chronic cough 05/30/2012   Guadelupe Sabin, OTR/L  [phone] 07/17/2018, 4:00 PM  [place] [address] [place], [place], [address] Phone: [phone]   Fax:  [phone]  Name: Angel Silva MRN: [id number] Date of Birth: [birth year]

## 2018-07-17 NOTE — Patient Instructions (Signed)

## 2018-07-18 ENCOUNTER — Ambulatory Visit (HOSPITAL_COMMUNITY): Payer: BC Managed Care – PPO | Admitting: Occupational Therapy

## 2018-07-19 ENCOUNTER — Encounter: Payer: Self-pay | Admitting: Family Medicine

## 2018-07-24 ENCOUNTER — Ambulatory Visit (HOSPITAL_COMMUNITY): Payer: BC Managed Care – PPO

## 2018-07-24 ENCOUNTER — Encounter (HOSPITAL_COMMUNITY): Payer: Self-pay

## 2018-07-24 ENCOUNTER — Other Ambulatory Visit: Payer: Self-pay

## 2018-07-24 DIAGNOSIS — R29898 Other symptoms and signs involving the musculoskeletal system: Secondary | ICD-10-CM

## 2018-07-24 DIAGNOSIS — M25612 Stiffness of left shoulder, not elsewhere classified: Secondary | ICD-10-CM

## 2018-07-24 DIAGNOSIS — M25512 Pain in left shoulder: Secondary | ICD-10-CM

## 2018-07-24 NOTE — Therapy (Signed)
[place], [place], [address] Phone: [phone]   Fax:  [phone]  Occupational Therapy Treatment  Patient Details  Name: Angel Silva MRN: [id number] Date of Birth: [birth year] Referring Provider (OT): Dr. Douglass Rivers   Encounter Date: 07/24/2018  OT End of Session - 07/24/18 1705    Visit Number  8    Number of Visits  16    Date for OT Re-Evaluation  08/12/18    Authorization Type  BCBS    OT Start Time  1630    OT Stop Time  1705    OT Time Calculation (min)  35 min    Activity Tolerance  Patient tolerated treatment well    Behavior During Therapy  [facility] for tasks assessed/performed       Past Medical History:  Diagnosis Date  . Abnormal Pap smear of cervix    over 30 yrs ago  . Anxiety   . Chronic joint pain   . Depression   . GDM (gestational diabetes mellitus)    History  . IBS (irritable bowel syndrome)   . Reflux     Past Surgical History:  Procedure Laterality Date  . ABDOMINAL HYSTERECTOMY  2002   TAH- Fibroids , endometriosis; ov retained  . BACK SURGERY  2005   L5-S1 Fusion  . CESAREAN SECTION  1987  . ESOPHAGOGASTRODUODENOSCOPY    . LAPAROSCOPIC CHOLECYSTECTOMY  1995  . MASTOPEXY  2012  . REDUCTION MAMMAPLASTY Bilateral   . TONSILLECTOMY    . TUBAL LIGATION  1993   BTSP    There were no vitals filed for this visit.  Subjective Assessment - 07/24/18 1642    Subjective   S: I picked up my suitcase with this arm and I probably shouldn't have done that because it made it really sore.    Currently in Pain?  Yes    Pain Score  3     Pain Location  Shoulder    Pain Orientation  Left    Pain Descriptors / Indicators  Aching;Sore    Pain Type  Acute pain    Pain Radiating Towards  N/A    Pain Onset  1 to 4 weeks ago    Pain Frequency  Intermittent    Aggravating Factors   movement. picking up suitcase    Pain Relieving Factors  rest, heating pad    Effect of Pain on Daily  Activities  mod effect    Multiple Pain Sites  No         [facility] OT Assessment - 07/24/18 1643      Assessment   Medical Diagnosis  s/p left arthroscopic RC repair with mini open bicep tenodesis      Precautions   Precautions  Shoulder    Type of Shoulder Precautions  See protocol                OT Treatments/Exercises (OP) - 07/24/18 1644      Exercises   Exercises  Shoulder      Shoulder Exercises: Supine   Protraction  PROM;5 reps;AROM;10 reps    Horizontal ABduction  PROM;5 reps;AROM;10 reps    External Rotation  PROM;5 reps;AROM;10 reps    Internal Rotation  PROM;5 reps;AROM;10 reps    Flexion  PROM;5 reps;AROM;10 reps    ABduction  PROM;5 reps;AROM;10 reps      Shoulder Exercises: Standing   Protraction  AAROM;12 reps    Horizontal ABduction  AAROM;12 reps  External Rotation  AAROM;12 reps    Internal Rotation  AAROM;12 reps    Flexion  AAROM;12 reps    ABduction  AAROM;12 reps      Shoulder Exercises: Pulleys   Flexion  1 minute   standing   ABduction  1 minute   standing     Shoulder Exercises: ROM/Strengthening   Wall Wash  1'    Thumb Tacks  1'    Proximal Shoulder Strengthening, Supine  10X; A/ROM no rest breaks    Proximal Shoulder Strengthening, Seated  10X A/ROM no rest breaks      Manual Therapy   Manual Therapy  Myofascial release    Manual therapy comments  manual therapy completed seperately from all other interventions    Myofascial Release  myofascial release and manual stretching to left upper arm, scapular, and shoulder region to decrease pain and restrictions and improve pain free mobility and available range in left shoulder region               OT Short Term Goals - 07/24/18 1645      OT SHORT TERM GOAL #1   Title  Pt will be educated on HEP to improve mobility required for ADL and work tasks.    Time  4    Period  Weeks    Status  On-going    Target Date  07/13/18      OT SHORT TERM GOAL #2   Title  Pt will  increase P[facility]ROM [facility] to [facility] to improve ability to perform dressing tasks with minimal compensatory strategies.    Time  4    Period  Weeks      OT SHORT TERM GOAL #3   Title  Pt will increase LUE strength to 3-/5 to improve ability to reaching for items at waist level.    Time  4    Period  Weeks        OT Long Term Goals - 07/24/18 1645      OT LONG TERM GOAL #1   Title  Pt will return to highest level of functioning and independence in ADL, work, and leisure tasks using LUE as non-dominant.    Time  8    Period  Weeks    Status  On-going      OT LONG TERM GOAL #2   Title  Pt will decrease pain in LUE to 2/10 or less to improve ability to sleep for 4 consecutive hours or longer at night.    Time  8    Period  Weeks    Status  On-going      OT LONG TERM GOAL #3   Title  Pt will decrease LUE fascial restrictions to minimal amounts or less to improve mobility required for functional reaching tasks.    Time  8    Period  Weeks      OT LONG TERM GOAL #4   Title  Pt will increase LUE A/ROM to [facility] to improve ability to reach overhead and behind back for dressing and bathing tasks.    Time  8    Period  Weeks    Status  On-going      OT LONG TERM GOAL #5   Title  Pt will improve strength in LUE to 5/5 to improve ability to perform yoga poses and lift lightweight items at work.    Time  8    Period  Weeks    Status  On-going  Plan - 07/24/18 1703    Clinical Impression Statement  A: Able to progress to A/ROM supine while remaining with AA/ROM standing. Increased repetitions standing to further progress strength. Added pulleys and wall wash for increased scapular stability. Pt reports soreness during exercises although able to tolerate all. VC for form and technique were provided.    Body Structure / Function / Physical Skills  ADL;UE functional use;Fascial restriction;Pain;IADL;Scar mobility;ROM;Strength    Plan  P: Add scapular theraband strengthening.     Consulted and Agree with Plan of Care  Patient       Patient will benefit from skilled therapeutic intervention in order to improve the following deficits and impairments:   Body Structure / Function / Physical Skills: ADL, UE functional use, Fascial restriction, Pain, IADL, Scar mobility, ROM, Strength       Visit Diagnosis: 1. Other symptoms and signs involving the musculoskeletal system   2. Acute pain of left shoulder   3. Stiffness of left shoulder, not elsewhere classified       Problem List Patient Active Problem List   Diagnosis Date Noted  . Tendinitis of both rotator cuffs 01/25/2017  . Generalized anxiety disorder 12/29/2014  . Chronic left shoulder pain 07/11/2013  . Insomnia 04/11/2013  . Atypical chest pain 01/17/2013  . Dyspnea 01/03/2013  . Esophageal reflux 05/30/2012  . Depression 05/30/2012  . Chronic cough 05/30/2012   Limmie PatriciaLaura Gwendoline Judy, OTR/L,CBIS  [phone]  07/24/2018, 5:12 PM  [facility] [address] [place], [place], [id number] Phone: [phone]   Fax:  [phone]  Name: Angel Silva MRN: [id number] Date of Birth: 04/15/1965

## 2018-07-25 ENCOUNTER — Ambulatory Visit (HOSPITAL_COMMUNITY): Payer: BC Managed Care – PPO

## 2018-07-25 ENCOUNTER — Encounter (HOSPITAL_COMMUNITY): Payer: Self-pay

## 2018-07-25 DIAGNOSIS — M25512 Pain in left shoulder: Secondary | ICD-10-CM

## 2018-07-25 DIAGNOSIS — R29898 Other symptoms and signs involving the musculoskeletal system: Secondary | ICD-10-CM | POA: Diagnosis not present

## 2018-07-25 DIAGNOSIS — M25612 Stiffness of left shoulder, not elsewhere classified: Secondary | ICD-10-CM

## 2018-07-25 NOTE — Therapy (Signed)
[place], [place], [address] Phone: [phone]   Fax:  [phone]  Occupational Therapy Treatment  Patient Details  Name: Angel Silva MRN: [id number] Date of Birth: [birth year] Referring Provider (OT): Dr. Douglass Rivers   Encounter Date: 07/25/2018  OT End of Session - 07/25/18 0922    Visit Number  9    Number of Visits  16    Date for OT Re-Evaluation  08/12/18    Authorization Type  BCBS    OT Start Time  0835    OT Stop Time  0910    OT Time Calculation (min)  35 min    Activity Tolerance  Patient tolerated treatment well    Behavior During Therapy  [facility] for tasks assessed/performed       Past Medical History:  Diagnosis Date  . Abnormal Pap smear of cervix    over 30 yrs ago  . Anxiety   . Chronic joint pain   . Depression   . GDM (gestational diabetes mellitus)    History  . IBS (irritable bowel syndrome)   . Reflux     Past Surgical History:  Procedure Laterality Date  . ABDOMINAL HYSTERECTOMY  2002   TAH- Fibroids , endometriosis; ov retained  . BACK SURGERY  2005   L5-S1 Fusion  . CESAREAN SECTION  1987  . ESOPHAGOGASTRODUODENOSCOPY    . LAPAROSCOPIC CHOLECYSTECTOMY  1995  . MASTOPEXY  2012  . REDUCTION MAMMAPLASTY Bilateral   . TONSILLECTOMY    . TUBAL LIGATION  1993   BTSP    There were no vitals filed for this visit.  Subjective Assessment - 07/25/18 0844    Subjective   S: I was sore last night just like you said. It kept me up last night. This morning it's not hurting.    Currently in Pain?  No/denies         [facility] OT Assessment - 07/25/18 0845      Assessment   Medical Diagnosis  s/p left arthroscopic RC repair with mini open bicep tenodesis      Precautions   Precautions  Shoulder    Type of Shoulder Precautions  See protocol                OT Treatments/Exercises (OP) - 07/25/18 0845      Exercises   Exercises  Shoulder      Shoulder  Exercises: Supine   Protraction  PROM;5 reps;AROM;10 reps    Horizontal ABduction  PROM;5 reps;AROM;10 reps    External Rotation  PROM;5 reps;AROM;10 reps    Internal Rotation  PROM;5 reps;AROM;10 reps    Flexion  PROM;5 reps;AROM;10 reps    ABduction  PROM;5 reps;AROM;10 reps      Shoulder Exercises: Standing   Protraction  AAROM;12 reps    Horizontal ABduction  AAROM;12 reps    External Rotation  AAROM;12 reps    Internal Rotation  AAROM;12 reps    Flexion  AAROM;12 reps    ABduction  AAROM;12 reps    Extension  Theraband;10 reps    Theraband Level (Shoulder Extension)  Level 2 (Red)    Row  Theraband;10 reps    Theraband Level (Shoulder Row)  Level 2 (Red)    Retraction  Theraband;10 reps    Theraband Level (Shoulder Retraction)  Level 2 (Red)      Shoulder Exercises: Pulleys   Flexion  1 minute   standing   ABduction  1 minute  standing     Shoulder Exercises: ROM/Strengthening   Wall Wash  1'    Thumb Tacks  1'    Proximal Shoulder Strengthening, Supine  10X; A/ROM no rest breaks    Proximal Shoulder Strengthening, Seated  10X A/ROM no rest breaks      Manual Therapy   Manual Therapy  Myofascial release    Manual therapy comments  manual therapy completed seperately from all other interventions    Myofascial Release  myofascial release and manual stretching to left upper arm, scapular, and shoulder region to decrease pain and restrictions and improve pain free mobility and available range in left shoulder region               OT Short Term Goals - 07/24/18 1645      OT SHORT TERM GOAL #1   Title  Pt will be educated on HEP to improve mobility required for ADL and work tasks.    Time  4    Period  Weeks    Status  On-going    Target Date  07/13/18      OT SHORT TERM GOAL #2   Title  Pt will increase P/ROM of LUE to [facility] to improve ability to perform dressing tasks with minimal compensatory strategies.    Time  4    Period  Weeks      OT SHORT TERM  GOAL #3   Title  Pt will increase LUE strength to 3-/5 to improve ability to reaching for items at waist level.    Time  4    Period  Weeks        OT Long Term Goals - 07/24/18 1645      OT LONG TERM GOAL #1   Title  Pt will return to highest level of functioning and independence in ADL, work, and leisure tasks using LUE as non-dominant.    Time  8    Period  Weeks    Status  On-going      OT LONG TERM GOAL #2   Title  Pt will decrease pain in LUE to 2/10 or less to improve ability to sleep for 4 consecutive hours or longer at night.    Time  8    Period  Weeks    Status  On-going      OT LONG TERM GOAL #3   Title  Pt will decrease LUE fascial restrictions to minimal amounts or less to improve mobility required for functional reaching tasks.    Time  8    Period  Weeks      OT LONG TERM GOAL #4   Title  Pt will increase LUE A/ROM to [facility] to improve ability to reach overhead and behind back for dressing and bathing tasks.    Time  8    Period  Weeks    Status  On-going      OT LONG TERM GOAL #5   Title  Pt will improve strength in LUE to 5/5 to improve ability to perform yoga poses and lift lightweight items at work.    Time  8    Period  Weeks    Status  On-going            Plan - 07/25/18 [id number]    Clinical Impression Statement  A: Added scapular strengthening with red band. patient required VC for form and technique. Patient with minimal fascial restrictions in the left upper trapezius region with manual techniques completed to  address. Verbally instructed patient to complete A/ROM supine at home and continue with AA/ROM standing at home.    Body Structure / Function / Physical Skills  ADL;UE functional use;Fascial restriction;Pain;IADL;Scar mobility;ROM;Strength    Plan  P: Provide scapular theraband strengthening to HEP. Progress to A/ROM standing when able.    Consulted and Agree with Plan of Care  Patient       Patient will benefit from skilled therapeutic  intervention in order to improve the following deficits and impairments:   Body Structure / Function / Physical Skills: ADL, UE functional use, Fascial restriction, Pain, IADL, Scar mobility, ROM, Strength       Visit Diagnosis: 1. Acute pain of left shoulder   2. Other symptoms and signs involving the musculoskeletal system   3. Stiffness of left shoulder, not elsewhere classified       Problem List Patient Active Problem List   Diagnosis Date Noted  . Tendinitis of both rotator cuffs 01/25/2017  . Generalized anxiety disorder 12/29/2014  . Chronic left shoulder pain 07/11/2013  . Insomnia 04/11/2013  . Atypical chest pain 01/17/2013  . Dyspnea 01/03/2013  . Esophageal reflux 05/30/2012  . Depression 05/30/2012  . Chronic cough 05/30/2012   Limmie PatriciaLaura Avigail Pilling, OTR/L,CBIS  [phone]  07/25/2018, 9:31 AM  [facility] [address] [place], [place], [address] Phone: [phone]   Fax:  [phone]  Name: Clovis CaoCatherine Polimeni MRN: [id number] Date of Birth: 06/05/1965

## 2018-07-26 ENCOUNTER — Ambulatory Visit (HOSPITAL_COMMUNITY): Payer: BC Managed Care – PPO | Admitting: Occupational Therapy

## 2018-07-31 ENCOUNTER — Ambulatory Visit (HOSPITAL_COMMUNITY): Payer: BC Managed Care – PPO | Admitting: Occupational Therapy

## 2018-07-31 ENCOUNTER — Telehealth (HOSPITAL_COMMUNITY): Payer: Self-pay | Admitting: Family Medicine

## 2018-07-31 NOTE — Telephone Encounter (Signed)
07/31/18  Therapist asked me to call patient abouther 7/31 415 appt to see if she would change to either 115 or 215 because she usually co-tx at 4 with SP on another patient.  Patient was willing to change to 215

## 2018-08-01 ENCOUNTER — Ambulatory Visit (HOSPITAL_COMMUNITY): Payer: BC Managed Care – PPO

## 2018-08-01 ENCOUNTER — Other Ambulatory Visit: Payer: Self-pay

## 2018-08-01 ENCOUNTER — Encounter (HOSPITAL_COMMUNITY): Payer: Self-pay

## 2018-08-01 DIAGNOSIS — M25512 Pain in left shoulder: Secondary | ICD-10-CM

## 2018-08-01 DIAGNOSIS — R29898 Other symptoms and signs involving the musculoskeletal system: Secondary | ICD-10-CM

## 2018-08-01 DIAGNOSIS — M25612 Stiffness of left shoulder, not elsewhere classified: Secondary | ICD-10-CM

## 2018-08-01 NOTE — Patient Instructions (Addendum)
Repeat all exercises 10-15 times, 1-2 times per day.  1) Shoulder Protraction    Begin with elbows by your side, slowly "punch" straight out in front of you.      2) Shoulder Flexion   Standing:         Begin with arms at your side with thumbs pointed up, slowly raise both arms up and forward towards overhead.        3) Horizontal abduction/adduction   Standing:           Begin with arms straight out in front of you, bring out to the side in at "T" shape. Keep arms straight entire time.       4) Internal & External Rotation    *No band* -Stand with elbows at the side and elbows bent 90 degrees. Move your forearms away from your body, then bring back inward toward the body.     5) Shoulder Abduction  Standing:       Lying on your back begin with your arms flat on the table next to your side. Slowly move your arms out to the side so that they go overhead, in a jumping jack or snow angel movement.    6) X to V arms (cheerleader move):  Begin with arms straight down, crossed in front of body in an "X". Keeping arms crossed, lift arms straight up overhead. Then spread arms apart into a "V" shape.  Bring back together into x and lower down to starting position.     (Home) Extension: Isometric / Bilateral Arm Retraction - Sitting   Facing anchor, hold hands and elbow at shoulder height, with elbow bent.  Pull arms back to squeeze shoulder blades together. Repeat 10-15 times. 1-3 times/day.   (Clinic) Extension / Flexion (Assist)   Face anchor, pull arms back, keeping elbow straight, and squeze shoulder blades together. Repeat 10-15 times. 1-3 times/day.   Copyright  VHI. All rights reserved.   (Home) Retraction: Row - Bilateral (Anchor)   Facing anchor, arms reaching forward, pull hands toward stomach, keeping elbows bent and at your sides and pinching shoulder blades together. Repeat 10-15 times. 1-3 times/day.   Copyright  VHI. All rights  reserved.

## 2018-08-01 NOTE — Therapy (Signed)
[place] [facility] [address] [place], [place], [address] Phone: [phone]   Fax:  [phone]  Occupational Therapy Treatment  Patient Details  Name: Angel CaoCatherine Silva MRN: [id number] Date of Birth: 05/12/1965 Referring Provider (OT): Dr. Floria RavelingBrian Waterman   Encounter Date: 08/01/2018  OT End of Session - 08/01/18 0910    Visit Number  10    Number of Visits  16    Date for OT Re-Evaluation  08/12/18    Authorization Type  BCBS    OT Start Time  [id number]    OT Stop Time  0912    OT Time Calculation (min)  38 min    Activity Tolerance  Patient tolerated treatment well    Behavior During Therapy  [facility] for tasks assessed/performed       Past Medical History:  Diagnosis Date  . Abnormal Pap smear of cervix    over 30 yrs ago  . Anxiety   . Chronic joint pain   . Depression   . GDM (gestational diabetes mellitus)    History  . IBS (irritable bowel syndrome)   . Reflux     Past Surgical History:  Procedure Laterality Date  . ABDOMINAL HYSTERECTOMY  2002   TAH- Fibroids , endometriosis; ov retained  . BACK SURGERY  2005   L5-S1 Fusion  . CESAREAN SECTION  1987  . ESOPHAGOGASTRODUODENOSCOPY    . LAPAROSCOPIC CHOLECYSTECTOMY  1995  . MASTOPEXY  2012  . REDUCTION MAMMAPLASTY Bilateral   . TONSILLECTOMY    . TUBAL LIGATION  1993   BTSP    There were no vitals filed for this visit.  Subjective Assessment - 08/01/18 0849    Subjective   S: It's just achy but not painful.    Currently in Pain?  No/denies         [facility] - 08/01/18 0849      Assessment   Medical Diagnosis  s/p left arthroscopic RC repair with mini open bicep tenodesis      Precautions   Precautions  Shoulder    Type of Shoulder Precautions  See protocol                OT Treatments/Exercises (OP) - 08/01/18 0850      Exercises   Exercises  Shoulder      Shoulder Exercises: Supine   Protraction  PROM;5 reps;AROM;12 reps    Horizontal  ABduction  PROM;5 reps;AROM;12 reps    External Rotation  PROM;5 reps;AROM;12 reps    Internal Rotation  PROM;5 reps;AROM;12 reps    Flexion  PROM;5 reps;AROM;12 reps    ABduction  PROM;5 reps;AROM;12 reps      Shoulder Exercises: Standing   Protraction  AROM;10 reps    Horizontal ABduction  AROM;10 reps    External Rotation  AROM;10 reps    Internal Rotation  AROM;10 reps    Flexion  AROM;10 reps    ABduction  AROM;10 reps    Extension  Theraband;10 reps    Theraband Level (Shoulder Extension)  Level 2 (Red)    Row  Theraband;10 reps    Theraband Level (Shoulder Row)  Level 2 (Red)    Retraction  Theraband;10 reps    Theraband Level (Shoulder Retraction)  Level 2 (Red)      Shoulder Exercises: ROM/Strengthening   UBE (Upper Arm Bike)  level 1 2' forward 2' reverse    pace: 5.0-6.0   Over Head Lace  2' seated  X to V Arms  10X A/ROM    Proximal Shoulder Strengthening, Supine  12X; A/ROM no rest breaks    Proximal Shoulder Strengthening, Seated  12X A/ROM no rest breaks      Manual Therapy   Manual Therapy  Myofascial release    Manual therapy comments  manual therapy completed seperately from all other interventions    Myofascial Release  myofascial release and manual stretching to left upper arm, scapular, and shoulder region to decrease pain and restrictions and improve pain free mobility and available range in left shoulder region             OT Education - 08/01/18 0900    Education Details  red band scapular strengthening and A/ROM shoulder exercises    Person(s) Educated  Patient    Methods  Explanation;Demonstration;Verbal cues;Handout    Comprehension  Returned demonstration;Verbalized understanding       OT Short Term Goals - 07/24/18 1645      OT SHORT TERM GOAL #1   Title  Pt will be educated on HEP to improve mobility required for ADL and work tasks.    Time  4    Period  Weeks    Status  On-going    Target Date  07/13/18      OT SHORT TERM GOAL  #2   Title  Pt will increase P/ROM of LUE to [facility] to improve ability to perform dressing tasks with minimal compensatory strategies.    Time  4    Period  Weeks      OT SHORT TERM GOAL #3   Title  Pt will increase LUE strength to 3-/5 to improve ability to reaching for items at waist level.    Time  4    Period  Weeks        OT Long Term Goals - 07/24/18 1645      OT LONG TERM GOAL #1   Title  Pt will return to highest level of functioning and independence in ADL, work, and leisure tasks using LUE as non-dominant.    Time  8    Period  Weeks    Status  On-going      OT LONG TERM GOAL #2   Title  Pt will decrease pain in LUE to 2/10 or less to improve ability to sleep for 4 consecutive hours or longer at night.    Time  8    Period  Weeks    Status  On-going      OT LONG TERM GOAL #3   Title  Pt will decrease LUE fascial restrictions to minimal amounts or less to improve mobility required for functional reaching tasks.    Time  8    Period  Weeks      OT LONG TERM GOAL #4   Title  Pt will increase LUE A/ROM to [facility] to improve ability to reach overhead and behind back for dressing and bathing tasks.    Time  8    Period  Weeks    Status  On-going      OT LONG TERM GOAL #5   Title  Pt will improve strength in LUE to 5/5 to improve ability to perform yoga poses and lift lightweight items at work.    Time  8    Period  Weeks    Status  On-going            Plan - 08/01/18 0912    Clinical Impression Statement  A: Pt was able to progress to all A/ROM supine and standing during session. No reports of pain. Added scapular strengthening with theraband to HEP. Updated HEP to A/ROM shoulder exercises as well. Patient with min fascial restrictions in left upper trapezius region with manual technique completed to address. VC for form and technique were provided.    Body Structure / Function / Physical Skills  ADL;UE functional use;Fascial restriction;Pain;IADL;Scar  mobility;ROM;Strength    Plan  P: Add therapy ball strengthening and therapy ball left/right circles.    Consulted and Agree with Plan of Care  Patient       Patient will benefit from skilled therapeutic intervention in order to improve the following deficits and impairments:   Body Structure / Function / Physical Skills: ADL, UE functional use, Fascial restriction, Pain, IADL, Scar mobility, ROM, Strength       Visit Diagnosis: 1. Acute pain of left shoulder   2. Stiffness of left shoulder, not elsewhere classified   3. Other symptoms and signs involving the musculoskeletal system       Problem List Patient Active Problem List   Diagnosis Date Noted  . Tendinitis of both rotator cuffs 01/25/2017  . Generalized anxiety disorder 12/29/2014  . Chronic left shoulder pain 07/11/2013  . Insomnia 04/11/2013  . Atypical chest pain 01/17/2013  . Dyspnea 01/03/2013  . Esophageal reflux 05/30/2012  . Depression 05/30/2012  . Chronic cough 05/30/2012   Ailene Ravel, OTR/L,CBIS  [phone]  08/01/2018, 9:29 AM  [place] [address] [place], [place], [address] Phone: [phone]   Fax:  [phone]  Name: Angel Silva MRN: [id number] Date of Birth: [birth year]

## 2018-08-02 ENCOUNTER — Ambulatory Visit (HOSPITAL_COMMUNITY): Payer: BC Managed Care – PPO | Admitting: Occupational Therapy

## 2018-08-02 ENCOUNTER — Telehealth (HOSPITAL_COMMUNITY): Payer: Self-pay | Admitting: Family Medicine

## 2018-08-02 NOTE — Telephone Encounter (Signed)
08/02/18   Pt called to cx but no reason was given

## 2018-08-06 ENCOUNTER — Encounter (HOSPITAL_COMMUNITY): Payer: Self-pay

## 2018-08-06 ENCOUNTER — Other Ambulatory Visit: Payer: Self-pay

## 2018-08-06 ENCOUNTER — Ambulatory Visit (HOSPITAL_COMMUNITY): Payer: BC Managed Care – PPO | Attending: Orthopaedic Surgery

## 2018-08-06 DIAGNOSIS — M25612 Stiffness of left shoulder, not elsewhere classified: Secondary | ICD-10-CM | POA: Insufficient documentation

## 2018-08-06 DIAGNOSIS — M25512 Pain in left shoulder: Secondary | ICD-10-CM | POA: Insufficient documentation

## 2018-08-06 DIAGNOSIS — R29898 Other symptoms and signs involving the musculoskeletal system: Secondary | ICD-10-CM | POA: Diagnosis present

## 2018-08-06 NOTE — Therapy (Signed)
[facility] [address] [place], [place], [address] Phone: [phone]   Fax:  [phone]  Occupational Therapy Treatment  Patient Details  Name: Angel Silva MRN: [id number] Date of Birth: 07/06/1965 Referring Provider (OT): Dr. Floria RavelingBrian Waterman   Encounter Date: 08/06/2018  OT End of Session - 08/06/18 1514    Visit Number  11    Number of Visits  16    Date for OT Re-Evaluation  08/12/18    Authorization Type  BCBS    OT Start Time  1435    OT Stop Time  1515    OT Time Calculation (min)  40 min    Activity Tolerance  Patient tolerated treatment well    Behavior During Therapy  [facility] for tasks assessed/performed       Past Medical History:  Diagnosis Date  . Abnormal Pap smear of cervix    over 30 yrs ago  . Anxiety   . Chronic joint pain   . Depression   . GDM (gestational diabetes mellitus)    History  . IBS (irritable bowel syndrome)   . Reflux     Past Surgical History:  Procedure Laterality Date  . ABDOMINAL HYSTERECTOMY  2002   TAH- Fibroids , endometriosis; ov retained  . BACK SURGERY  2005   L5-S1 Fusion  . CESAREAN SECTION  1987  . ESOPHAGOGASTRODUODENOSCOPY    . LAPAROSCOPIC CHOLECYSTECTOMY  1995  . MASTOPEXY  2012  . REDUCTION MAMMAPLASTY Bilateral   . TONSILLECTOMY    . TUBAL LIGATION  1993   BTSP    There were no vitals filed for this visit.  Subjective Assessment - 08/06/18 1450    Subjective   S: I wasn't able to make my appointment on Friday.    Currently in Pain?  No/denies    Pain Score  0-No pain         [facility] OT Assessment - 08/06/18 1452      Assessment   Medical Diagnosis  s/p left arthroscopic RC repair with mini open bicep tenodesis      Precautions   Precautions  Shoulder    Type of Shoulder Precautions  See protocol                OT Treatments/Exercises (OP) - 08/06/18 1452      Exercises   Exercises  Shoulder      Shoulder Exercises: Supine   Protraction   PROM;5 reps;AROM;12 reps    Horizontal ABduction  PROM;5 reps;AROM;12 reps    External Rotation  PROM;5 reps;AROM;12 reps    Internal Rotation  PROM;5 reps;AROM;12 reps    Flexion  PROM;5 reps;AROM;12 reps    ABduction  PROM;5 reps;AROM;12 reps      Shoulder Exercises: Standing   Horizontal ABduction  AROM;10 reps    External Rotation  AROM;10 reps    Internal Rotation  AROM;10 reps    ABduction  AROM;10 reps    Extension  Theraband;10 reps    Theraband Level (Shoulder Extension)  Level 2 (Red)    Row  Theraband;10 reps    Theraband Level (Shoulder Row)  Level 2 (Red)    Retraction  Theraband;10 reps    Theraband Level (Shoulder Retraction)  Level 2 (Red)      Shoulder Exercises: Therapy Ball   Right/Left  5 reps    Other Therapy Ball Exercises  Green therapy ball: Chest press, flexion, circles; 10X      Shoulder Exercises: ROM/Strengthening  UBE (Upper Arm Bike)  level 1 2' forward 2' reverse    pace: 5.0-6.0   Over Head Lace  2' seated    X to V Arms  10X A/ROM    Proximal Shoulder Strengthening, Supine  12X; A/ROM no rest breaks    Proximal Shoulder Strengthening, Seated  12X A/ROM no rest breaks      Manual Therapy   Manual Therapy  Myofascial release    Manual therapy comments  manual therapy completed seperately from all other interventions    Myofascial Release  myofascial release and manual stretching to left upper arm, scapular, and shoulder region to decrease pain and restrictions and improve pain free mobility and available range in left shoulder region               OT Short Term Goals - 07/24/18 1645      OT SHORT TERM GOAL #1   Title  Pt will be educated on HEP to improve mobility required for ADL and work tasks.    Time  4    Period  Weeks    Status  On-going    Target Date  07/13/18      OT SHORT TERM GOAL #2   Title  Pt will increase P/ROM of LUE to [facility] to improve ability to perform dressing tasks with minimal compensatory strategies.    Time   4    Period  Weeks      OT SHORT TERM GOAL #3   Title  Pt will increase LUE strength to 3-/5 to improve ability to reaching for items at waist level.    Time  4    Period  Weeks        OT Long Term Goals - 07/24/18 1645      OT LONG TERM GOAL #1   Title  Pt will return to highest level of functioning and independence in ADL, work, and leisure tasks using LUE as non-dominant.    Time  8    Period  Weeks    Status  On-going      OT LONG TERM GOAL #2   Title  Pt will decrease pain in LUE to 2/10 or less to improve ability to sleep for 4 consecutive hours or longer at night.    Time  8    Period  Weeks    Status  On-going      OT LONG TERM GOAL #3   Title  Pt will decrease LUE fascial restrictions to minimal amounts or less to improve mobility required for functional reaching tasks.    Time  8    Period  Weeks      OT LONG TERM GOAL #4   Title  Pt will increase LUE A/ROM to [facility] to improve ability to reach overhead and behind back for dressing and bathing tasks.    Time  8    Period  Weeks    Status  On-going      OT LONG TERM GOAL #5   Title  Pt will improve strength in LUE to 5/5 to improve ability to perform yoga poses and lift lightweight items at work.    Time  8    Period  Weeks    Status  On-going            Plan - 08/06/18 1514    Clinical Impression Statement  A: Continued with A/ROM shoulder exercises supine and standing. Added therapy ball circles and therapy ball strengthening  exercises to further work on ROM and strength. Patient was able to complete all exercises without mention of pain or muscle fatigue. VC for form and technique were provided. Manual technique completed to address fascial restrictions in left upper trapezius region.    Body Structure / Function / Physical Skills  ADL;UE functional use;Fascial restriction;Pain;IADL;Scar mobility;ROM;Strength    Plan  P: reassessment and re-cert. Contiue to follow protocol. Cntinue with strengthening.  Attempt scapular strengthening with red band loop exercises on wall.    Consulted and Agree with Plan of Care  Patient       Patient will benefit from skilled therapeutic intervention in order to improve the following deficits and impairments:   Body Structure / Function / Physical Skills: ADL, UE functional use, Fascial restriction, Pain, IADL, Scar mobility, ROM, Strength       Visit Diagnosis: 1. Acute pain of left shoulder   2. Stiffness of left shoulder, not elsewhere classified   3. Other symptoms and signs involving the musculoskeletal system       Problem List Patient Active Problem List   Diagnosis Date Noted  . Tendinitis of both rotator cuffs 01/25/2017  . Generalized anxiety disorder 12/29/2014  . Chronic left shoulder pain 07/11/2013  . Insomnia 04/11/2013  . Atypical chest pain 01/17/2013  . Dyspnea 01/03/2013  . Esophageal reflux 05/30/2012  . Depression 05/30/2012  . Chronic cough 05/30/2012   Ailene Ravel, OTR/L,CBIS  [phone]  08/06/2018, 3:27 PM  [place] [address] [place], [place], [address] Phone: [phone]   Fax:  [phone]  Name: Angel Silva MRN: [id number] Date of Birth: 1965-10-02

## 2018-08-09 ENCOUNTER — Ambulatory Visit (HOSPITAL_COMMUNITY): Payer: BC Managed Care – PPO | Admitting: Occupational Therapy

## 2018-08-09 ENCOUNTER — Other Ambulatory Visit: Payer: Self-pay

## 2018-08-09 ENCOUNTER — Encounter (HOSPITAL_COMMUNITY): Payer: Self-pay | Admitting: Occupational Therapy

## 2018-08-09 DIAGNOSIS — M25612 Stiffness of left shoulder, not elsewhere classified: Secondary | ICD-10-CM

## 2018-08-09 DIAGNOSIS — M25512 Pain in left shoulder: Secondary | ICD-10-CM | POA: Diagnosis not present

## 2018-08-09 DIAGNOSIS — R29898 Other symptoms and signs involving the musculoskeletal system: Secondary | ICD-10-CM

## 2018-08-09 NOTE — Therapy (Signed)
[place], [place], [address] Phone: [phone]   Fax:  [phone]  Occupational Therapy Reassessment, Treatment (recertification)  Patient Details  Name: Angel Silva MRN: [id number] Date of Birth: [birth year] Referring Provider (OT): Dr. Douglass Rivers   Encounter Date: 08/09/2018  OT End of Session - 08/09/18 1003    Visit Number  12    Number of Visits  20    Date for OT Re-Evaluation  09/08/18    Authorization Type  BCBS    OT Start Time  0915    OT Stop Time  0955    OT Time Calculation (min)  40 min    Activity Tolerance  Patient tolerated treatment well    Behavior During Therapy  [facility] for tasks assessed/performed       Past Medical History:  Diagnosis Date  . Abnormal Pap smear of cervix    over 30 yrs ago  . Anxiety   . Chronic joint pain   . Depression   . GDM (gestational diabetes mellitus)    History  . IBS (irritable bowel syndrome)   . Reflux     Past Surgical History:  Procedure Laterality Date  . ABDOMINAL HYSTERECTOMY  2002   TAH- Fibroids , endometriosis; ov retained  . BACK SURGERY  2005   L5-S1 Fusion  . CESAREAN SECTION  1987  . ESOPHAGOGASTRODUODENOSCOPY    . LAPAROSCOPIC CHOLECYSTECTOMY  1995  . MASTOPEXY  2012  . REDUCTION MAMMAPLASTY Bilateral   . TONSILLECTOMY    . TUBAL LIGATION  1993   BTSP    There were no vitals filed for this visit.  Subjective Assessment - 08/09/18 0913    Subjective   S: The exercises are bothering my right shoulder.    Currently in Pain?  No/denies         [facility] OT Assessment - 08/09/18 0913      Assessment   Medical Diagnosis  s/p left arthroscopic RC repair with mini open bicep tenodesis      Precautions   Precautions  Shoulder    Type of Shoulder Precautions  See protocol       Observation/Other Assessments   Focus on Therapeutic Outcomes (FOTO)   56/100   13/100 previous     AROM   Overall AROM Comments  Assessed  seated, er/IR adducted    AROM Assessment Site  Shoulder    Right/Left Shoulder  Left    Left Shoulder Flexion  180 Degrees   111 previous   Left Shoulder ABduction  180 Degrees   70 previous   Left Shoulder Internal Rotation  90 Degrees   same as previous   Left Shoulder External Rotation  70 Degrees   55 previous     PROM   Overall PROM Comments  Assessed seated, er/IR adducted    PROM Assessment Site  Shoulder    Right/Left Shoulder  Left    Left Shoulder Flexion  180 Degrees   170 previous   Left Shoulder ABduction  180 Degrees   same as previous   Left Shoulder Internal Rotation  90 Degrees   same as previous   Left Shoulder External Rotation  55 Degrees   44 previous     Strength   Overall Strength Comments  Assessed seated, er/IR adducted    Strength Assessment Site  Shoulder    Right/Left Shoulder  Left    Left Shoulder Flexion  4-/5   3-/5 previous  Left Shoulder ABduction  4/5   3-/5 previous   Left Shoulder Internal Rotation  4/5   3/5 previous   Left Shoulder External Rotation  4-/5   3/5 previous              OT Treatments/Exercises (OP) - 08/09/18 0916      Exercises   Exercises  Shoulder      Shoulder Exercises: Supine   Protraction  PROM;5 reps;AROM;15 reps    Horizontal ABduction  PROM;5 reps;AROM;15 reps    External Rotation  PROM;5 reps;AROM;15 reps    Internal Rotation  PROM;5 reps;AROM;15 reps    Flexion  PROM;5 reps;AROM;15 reps    ABduction  PROM;5 reps;AROM;15 reps      Shoulder Exercises: Standing   Protraction  AROM;10 reps    Horizontal ABduction  AROM;10 reps    External Rotation  AROM;10 reps    Internal Rotation  AROM;10 reps    Flexion  AROM;10 reps    ABduction  AROM;10 reps      Shoulder Exercises: ROM/Strengthening   X to V Arms  10X A/ROM    Proximal Shoulder Strengthening, Supine  12X; A/ROM no rest breaks    Ball on Wall  1' flexion    Other ROM/Strengthening Exercises  red loop band: lateral wall slides,  diagonal clock slides (11, 9, 7), 10X each      Manual Therapy   Manual Therapy  Myofascial release    Manual therapy comments  manual therapy completed seperately from all other interventions    Myofascial Release  myofascial release and manual stretching to left upper arm, scapular, and shoulder region to decrease pain and restrictions and improve pain free mobility and available range in left shoulder region               OT Short Term Goals - 08/09/18 1003      OT SHORT TERM GOAL #1   Title  Pt will be educated on HEP to improve mobility required for ADL and work tasks.    Time  4    Period  Weeks    Status  On-going    Target Date  07/13/18      OT SHORT TERM GOAL #2   Title  Pt will increase P/ROM of LUE to [facility] to improve ability to perform dressing tasks with minimal compensatory strategies.    Time  4    Period  Weeks    Status  Achieved      OT SHORT TERM GOAL #3   Title  Pt will increase LUE strength to 3-/5 to improve ability to reaching for items at waist level.    Time  4    Period  Weeks        OT Long Term Goals - 08/09/18 1004      OT LONG TERM GOAL #1   Title  Pt will return to highest level of functioning and independence in ADL, work, and leisure tasks using LUE as non-dominant.    Time  8    Period  Weeks    Status  On-going      OT LONG TERM GOAL #2   Title  Pt will decrease pain in LUE to 2/10 or less to improve ability to sleep for 4 consecutive hours or longer at night.    Time  8    Period  Weeks    Status  On-going      OT LONG TERM GOAL #3  Title  Pt will decrease LUE fascial restrictions to minimal amounts or less to improve mobility required for functional reaching tasks.    Time  8    Period  Weeks      OT LONG TERM GOAL #4   Title  Pt will increase LUE A/ROM to [facility] to improve ability to reach overhead and behind back for dressing and bathing tasks.    Time  8    Period  Weeks    Status  Achieved      OT LONG TERM GOAL #5    Title  Pt will improve strength in LUE to 5/5 to improve ability to perform yoga poses and lift lightweight items at work.    Time  8    Period  Weeks    Status  On-going            Plan - 08/09/18 1004    Clinical Impression Statement  A: Reassessment completed this session, pt has met 2/3 STGs and 2/5 LTGs making great progress with pain level, ROM, and strength. Pt is working and is able to use her LUE for functional tasks at work. Added scapular stability tasks with red loop band and ball on wall today. Pt fatigues easily requiring occasional rest breaks. Pt reports pain in right shoulder is increasing therefore only used LUE for exercises when able. Verbal cuing for form and technique.    OT Occupational Profile and History  Problem Focused Assessment - Including review of records relating to presenting problem    Occupational performance deficits (Please refer to evaluation for details):  ADL's;IADL's;Rest and Sleep;Work;Leisure    Body Structure / Function / Physical Skills  ADL;UE functional use;Fascial restriction;Pain;IADL;Scar mobility;ROM;Strength    Rehab Potential  Good    Clinical Decision Making  Limited treatment options, no task modification necessary    Comorbidities Affecting Occupational Performance:  None    Modification or Assistance to Complete Evaluation   No modification of tasks or assist necessary to complete eval    OT Frequency  2x / week    OT Duration  4 weeks    OT Treatment/Interventions  Self-care/ADL training;Ultrasound;Patient/family education;Passive range of motion;Cryotherapy;Electrical Stimulation;Moist Heat;Therapeutic exercise;Manual Therapy;Therapeutic activities    Plan  P: Continue with skilled OT services focusing on shoulder and scapular strengthening and stability required for functional task completion. Next session: D/C myofascial release and passive stretching. continue with scapular stability and add ball on wall in abduction        Patient will benefit from skilled therapeutic intervention in order to improve the following deficits and impairments:   Body Structure / Function / Physical Skills: ADL, UE functional use, Fascial restriction, Pain, IADL, Scar mobility, ROM, Strength       Visit Diagnosis: 1. Acute pain of left shoulder   2. Stiffness of left shoulder, not elsewhere classified   3. Other symptoms and signs involving the musculoskeletal system       Problem List Patient Active Problem List   Diagnosis Date Noted  . Tendinitis of both rotator cuffs 01/25/2017  . Generalized anxiety disorder 12/29/2014  . Chronic left shoulder pain 07/11/2013  . Insomnia 04/11/2013  . Atypical chest pain 01/17/2013  . Dyspnea 01/03/2013  . Esophageal reflux 05/30/2012  . Depression 05/30/2012  . Chronic cough 05/30/2012   Guadelupe Sabin, OTR/L  [phone] 08/09/2018, 10:08 AM  [place], [place], [address] Phone: [phone]   Fax:  [phone]  Name: Haylin Camilli MRN: [id number] Date of Birth: October 26, 1965

## 2018-08-13 ENCOUNTER — Other Ambulatory Visit: Payer: Self-pay

## 2018-08-13 ENCOUNTER — Ambulatory Visit (HOSPITAL_COMMUNITY): Payer: BC Managed Care – PPO | Admitting: Occupational Therapy

## 2018-08-13 ENCOUNTER — Encounter (HOSPITAL_COMMUNITY): Payer: Self-pay | Admitting: Occupational Therapy

## 2018-08-13 ENCOUNTER — Ambulatory Visit: Payer: BLUE CROSS/BLUE SHIELD | Admitting: Obstetrics and Gynecology

## 2018-08-13 DIAGNOSIS — R29898 Other symptoms and signs involving the musculoskeletal system: Secondary | ICD-10-CM

## 2018-08-13 DIAGNOSIS — M25612 Stiffness of left shoulder, not elsewhere classified: Secondary | ICD-10-CM

## 2018-08-13 DIAGNOSIS — M25512 Pain in left shoulder: Secondary | ICD-10-CM

## 2018-08-13 NOTE — Progress Notes (Signed)
GYNECOLOGY  VISIT   HPI: [id number] y.o.   Divorced White or Caucasian Not Hispanic or Latino  female   [phone] with Patient's last menstrual period was 01/03/2000.   here for  Breast check  She was treated for a left breast abscess in 3/20. On exam in 5/20 she still had a 2 x 1.5 cm lump in the left breast, it had decreased in size. Last imaging recommended f/u diagnostic mammogram and right breast ultrasound in 10/20. She developed Colitis after the antibiotics for her abscess.   In 5/20 she was also started on vaginal estrogen for dryness. It has definitely helped, but doesn't quite seem to be enough. She is okay if she uses a lubricant. Her libido is good, sex is great, but having a harder time having an orgasm.   She had left shoulder surgery in 6/20, doing well. Will need right shoulder surgery later this year.    GYNECOLOGIC HISTORY: Patient's last menstrual period was 01/03/2000. Contraception:PMP Menopausal hormone therapy: estradiol vag tabs         OB History    Gravida  5   Para  3   Term  1   Preterm  2   AB  2   Living  3     SAB      TAB  1   Ectopic      Multiple      Live Births  3              Patient Active Problem List   Diagnosis Date Noted  . Tendinitis of both rotator cuffs 01/25/2017  . Generalized anxiety disorder 12/29/2014  . Chronic left shoulder pain 07/11/2013  . Insomnia 04/11/2013  . Atypical chest pain 01/17/2013  . Dyspnea 01/03/2013  . Esophageal reflux 05/30/2012  . Depression 05/30/2012  . Chronic cough 05/30/2012    Past Medical History:  Diagnosis Date  . Abnormal Pap smear of cervix    over 30 yrs ago  . Anxiety   . Chronic joint pain   . Depression   . GDM (gestational diabetes mellitus)    History  . IBS (irritable bowel syndrome)   . Reflux     Past Surgical History:  Procedure Laterality Date  . ABDOMINAL HYSTERECTOMY  2002   TAH- Fibroids , endometriosis; ov retained  . BACK SURGERY  2005   L5-S1  Fusion  . CESAREAN SECTION  1987  . ESOPHAGOGASTRODUODENOSCOPY    . LAPAROSCOPIC CHOLECYSTECTOMY  1995  . MASTOPEXY  2012  . REDUCTION MAMMAPLASTY Bilateral   . TONSILLECTOMY    . TUBAL LIGATION  1993   BTSP    Current Outpatient Medications  Medication Sig Dispense Refill  . albuterol (PROVENTIL HFA;VENTOLIN HFA) 108 (90 Base) MCG/ACT inhaler Inhale 2 puffs into the lungs every 6 (six) hours as needed for wheezing or shortness of breath. 1 Inhaler 2  . Boswellia-Glucosamine-Vit D (OSTEO BI-FLEX ONE PER DAY PO) Take by mouth.    . clonazePAM (KLONOPIN) 1 MG tablet Take 1 tablet (1 mg total) by mouth daily as needed. 30 tablet 5  . Estradiol 10 MCG TABS vaginal tablet Place 1 tablet (10 mcg total) vaginally 2 (two) times a week. 24 tablet 0  . ibuprofen (ADVIL,MOTRIN) 800 MG tablet Take 1 tablet (800 mg total) by mouth every 8 (eight) hours as needed. 30 tablet 1  . Multiple Vitamin (MULTIVITAMIN) tablet Take 1 tablet by mouth daily.    . Probiotic Product (PROBIOTIC PO) Take  by mouth.    . traZODone (DESYREL) 50 MG tablet Take 1-2 tablets (50-100 mg total) by mouth at bedtime as needed for sleep. 45 tablet 5   No current facility-administered medications for this visit.      ALLERGIES: Effexor [venlafaxine hcl], Sulfa antibiotics, and Zoloft [sertraline hcl]  Family History  Problem Relation Age of Onset  . Hypertension Mother   . Diabetes Mother        Type 2  . Thyroid disease Mother   . Glaucoma Mother   . Hypertension Father   . Atrial fibrillation Father   . Cancer Father        bladder & liver  . Heart disease Father   . Crohn's disease Father   . Heart attack Maternal Grandfather   . Diabetes Maternal Grandmother        Type 2  . Hypertension Maternal Grandmother   . Heart disease Maternal Grandmother   . Heart disease Paternal Grandmother   . CAD Maternal Uncle   . Hypertension Brother   . Melanoma Brother   . Heart murmur Brother   . Other Brother         leaky aortic valve  . Breast cancer Neg Hx     Social History   Socioeconomic History  . Marital status: Divorced    Spouse name: Not on file  . Number of children: Not on file  . Years of education: Not on file  . Highest education level: Not on file  Occupational History  . Not on file  Social Needs  . Financial resource strain: Not on file  . Food insecurity    Worry: Not on file    Inability: Not on file  . Transportation needs    Medical: Not on file    Non-medical: Not on file  Tobacco Use  . Smoking status: Former Games developermoker  . Smokeless tobacco: Never Used  Substance and Sexual Activity  . Alcohol use: Yes    Alcohol/week: 0.0 - 3.0 standard drinks  . Drug use: No  . Sexual activity: Yes    Partners: Male    Birth control/protection: Surgical    Comment: TAH  Lifestyle  . Physical activity    Days per week: Not on file    Minutes per session: Not on file  . Stress: Not on file  Relationships  . Social Musicianconnections    Talks on phone: Not on file    Gets together: Not on file    Attends religious service: Not on file    Active member of club or organization: Not on file    Attends meetings of clubs or organizations: Not on file    Relationship status: Not on file  . Intimate partner violence    Fear of current or ex partner: Not on file    Emotionally abused: Not on file    Physically abused: Not on file    Forced sexual activity: Not on file  Other Topics Concern  . Not on file  Social History Narrative  . Not on file    Review of Systems  Constitutional: Negative.   HENT: Negative.   Eyes: Negative.   Respiratory: Negative.   Cardiovascular: Negative.   Gastrointestinal: Negative.   Genitourinary: Negative.   Musculoskeletal: Negative.   Skin: Negative.   Neurological: Negative.   Endo/Heme/Allergies: Negative.   Psychiatric/Behavioral: Negative.   All other systems reviewed and are negative.   PHYSICAL EXAMINATION:    BP 128/86  Pulse  80   Temp 98.2 F (36.8 C) (Temporal)   Ht 5\' 3"  (1.6 m)   Wt 140 lb (63.5 kg)   LMP 01/03/2000   BMI 24.80 kg/m     General appearance: alert, cooperative and appears stated age Breasts: normal appearance, no masses or tenderness  No supraclavicular or axillary adenopathy.  ASSESSMENT H/O left breast abscess Vaginal atrophy, helped with vaginal estrogen Difficulty achieving orgasm    PLAN F/U imaging in 10/20 Annual in 10/20 Will check testosterone levels She will try weaning off of Trazadone to see if that helps with orgasmic dysfunction Reading information given on orgasms   An After Visit Summary was printed and given to the patient.  ~20 minutes face to face time of which over 50% was spent in counseling.

## 2018-08-13 NOTE — Therapy (Signed)
[place], [place], [address] Phone: [phone]   Fax:  [phone]  Occupational Therapy Treatment  Patient Details  Name: Angel Silva MRN: [id number] Date of Birth: 26-Feb-1965 Referring Provider (OT): Dr. Douglass Rivers   Encounter Date: 08/13/2018  OT End of Session - 08/13/18 1555    Visit Number  13    Number of Visits  20    Date for OT Re-Evaluation  09/08/18    Authorization Type  BCBS    OT Start Time  1513    OT Stop Time  1553    OT Time Calculation (min)  40 min    Activity Tolerance  Patient tolerated treatment well    Behavior During Therapy  [facility] for tasks assessed/performed       Past Medical History:  Diagnosis Date  . Abnormal Pap smear of cervix    over 30 yrs ago  . Anxiety   . Chronic joint pain   . Depression   . GDM (gestational diabetes mellitus)    History  . IBS (irritable bowel syndrome)   . Reflux     Past Surgical History:  Procedure Laterality Date  . ABDOMINAL HYSTERECTOMY  2002   TAH- Fibroids , endometriosis; ov retained  . BACK SURGERY  2005   L5-S1 Fusion  . CESAREAN SECTION  1987  . ESOPHAGOGASTRODUODENOSCOPY    . LAPAROSCOPIC CHOLECYSTECTOMY  1995  . MASTOPEXY  2012  . REDUCTION MAMMAPLASTY Bilateral   . TONSILLECTOMY    . TUBAL LIGATION  1993   BTSP    There were no vitals filed for this visit.  Subjective Assessment - 08/13/18 1515    Subjective   S: I was really sore this weekend.    Currently in Pain?  No/denies         [facility] OT Assessment - 08/13/18 1514      Assessment   Medical Diagnosis  s/p left arthroscopic RC repair with mini open bicep tenodesis      Precautions   Precautions  Shoulder    Type of Shoulder Precautions  See protocol                OT Treatments/Exercises (OP) - 08/13/18 1515      Exercises   Exercises  Shoulder      Shoulder Exercises: Supine   Protraction  AROM;15 reps    Horizontal ABduction   AROM;15 reps    External Rotation  PROM;5 reps;AROM;15 reps    Internal Rotation  AROM;15 reps    Flexion  AROM;15 reps    ABduction  AROM;15 reps      Shoulder Exercises: Prone   Other Prone Exercises  hughston exercises, 5 positions, 10X each      Shoulder Exercises: Standing   Protraction  AROM;12 reps    Horizontal ABduction  AROM;12 reps    External Rotation  AROM;12 reps    Internal Rotation  AROM;12 reps    Flexion  AROM;12 reps    ABduction  AROM;12 reps      Shoulder Exercises: Therapy Ball   Other Therapy Ball Exercises  Green therapy ball: Chest press, overhead press, flexion, circles; 10X      Shoulder Exercises: ROM/Strengthening   Over Head Lace  2' seated    "W" Arms  10X A/ROM    X to V Arms  12X A/ROM    Proximal Shoulder Strengthening, Supine  15X; A/ROM no rest breaks  Ball on Wall  1' flexion 1' abduction    Rhythmic Stabilization, Supine  30" at 45, 90, and 120 degrees; mod difficulty    Other ROM/Strengthening Exercises  red loop band: lateral wall slides, diagonal clock slides (11, 9, 7), 10X each                                OT Short Term Goals - 08/09/18 1003      OT SHORT TERM GOAL #1   Title  Pt will be educated on HEP to improve mobility required for ADL and work tasks.    Time  4    Period  Weeks    Status  On-going    Target Date  07/13/18      OT SHORT TERM GOAL #2   Title  Pt will increase P/ROM of LUE to [facility] to improve ability to perform dressing tasks with minimal compensatory strategies.    Time  4    Period  Weeks    Status  Achieved      OT SHORT TERM GOAL #3   Title  Pt will increase LUE strength to 3-/5 to improve ability to reaching for items at waist level.    Time  4    Period  Weeks        OT Long Term Goals - 08/09/18 1004      OT LONG TERM GOAL #1   Title  Pt will return to highest level of functioning and independence in ADL, work, and leisure tasks using LUE as non-dominant.    Time  8    Period   Weeks    Status  On-going      OT LONG TERM GOAL #2   Title  Pt will decrease pain in LUE to 2/10 or less to improve ability to sleep for 4 consecutive hours or longer at night.    Time  8    Period  Weeks    Status  On-going      OT LONG TERM GOAL #3   Title  Pt will decrease LUE fascial restrictions to minimal amounts or less to improve mobility required for functional reaching tasks.    Time  8    Period  Weeks      OT LONG TERM GOAL #4   Title  Pt will increase LUE A/ROM to [facility] to improve ability to reach overhead and behind back for dressing and bathing tasks.    Time  8    Period  Weeks    Status  Achieved      OT LONG TERM GOAL #5   Title  Pt will improve strength in LUE to 5/5 to improve ability to perform yoga poses and lift lightweight items at work.    Time  8    Period  Weeks    Status  On-going            Plan - 08/13/18 1555    Clinical Impression Statement  A: Discontinued manual therapy and passive stretching with exception of er as pt has trace fascial restrictions and ROM is full. Continued with A/ROM adding prone hughston exercises, W arms, and rhythmic stabilization for scapular strengthening and stability. Pt with mod fatigue, occasionally only completing tasks with LUE due to RUE discomfort. Verbal cuing for form and technique.    Body Structure / Function / Physical Skills  ADL;UE functional use;Fascial restriction;Pain;IADL;Scar mobility;ROM;Strength  Plan  P: Continue with scapular strengthening and stability tasks, continue with prone hughston working on stability and strengthening       Patient will benefit from skilled therapeutic intervention in order to improve the following deficits and impairments:   Body Structure / Function / Physical Skills: ADL, UE functional use, Fascial restriction, Pain, IADL, Scar mobility, ROM, Strength       Visit Diagnosis: 1. Acute pain of left shoulder   2. Stiffness of left shoulder, not elsewhere  classified   3. Other symptoms and signs involving the musculoskeletal system       Problem List Patient Active Problem List   Diagnosis Date Noted  . Tendinitis of both rotator cuffs 01/25/2017  . Generalized anxiety disorder 12/29/2014  . Chronic left shoulder pain 07/11/2013  . Insomnia 04/11/2013  . Atypical chest pain 01/17/2013  . Dyspnea 01/03/2013  . Esophageal reflux 05/30/2012  . Depression 05/30/2012  . Chronic cough 05/30/2012   Ezra SitesLeslie Troxler, OTR/L  [phone] 08/13/2018, 3:59 PM  [facility] [facility] [address] [place], [place], [address] Phone: [phone]   Fax:  [phone]  Name: Angel Silva MRN: [id number] Date of Birth: 04/27/1965

## 2018-08-14 ENCOUNTER — Encounter: Payer: Self-pay | Admitting: Obstetrics and Gynecology

## 2018-08-14 ENCOUNTER — Ambulatory Visit: Payer: BC Managed Care – PPO | Admitting: Obstetrics and Gynecology

## 2018-08-14 ENCOUNTER — Other Ambulatory Visit: Payer: Self-pay

## 2018-08-14 VITALS — BP 128/86 | HR 80 | Temp 98.2°F | Ht 63.0 in | Wt 140.0 lb

## 2018-08-14 DIAGNOSIS — F5231 Female orgasmic disorder: Secondary | ICD-10-CM | POA: Diagnosis not present

## 2018-08-14 DIAGNOSIS — N952 Postmenopausal atrophic vaginitis: Secondary | ICD-10-CM

## 2018-08-14 DIAGNOSIS — Z872 Personal history of diseases of the skin and subcutaneous tissue: Secondary | ICD-10-CM | POA: Diagnosis not present

## 2018-08-14 DIAGNOSIS — IMO0002 Reserved for concepts with insufficient information to code with codable children: Secondary | ICD-10-CM

## 2018-08-14 MED ORDER — ESTRADIOL 10 MCG VA TABS: 1.0000 | ORAL_TABLET | VAGINAL | 0 refills | Status: DC

## 2018-08-15 ENCOUNTER — Encounter (HOSPITAL_COMMUNITY): Payer: Self-pay | Admitting: Specialist

## 2018-08-15 ENCOUNTER — Ambulatory Visit (HOSPITAL_COMMUNITY): Payer: BC Managed Care – PPO | Admitting: Specialist

## 2018-08-15 ENCOUNTER — Other Ambulatory Visit: Payer: Self-pay

## 2018-08-15 DIAGNOSIS — M25512 Pain in left shoulder: Secondary | ICD-10-CM | POA: Diagnosis not present

## 2018-08-15 DIAGNOSIS — M25612 Stiffness of left shoulder, not elsewhere classified: Secondary | ICD-10-CM

## 2018-08-15 DIAGNOSIS — R29898 Other symptoms and signs involving the musculoskeletal system: Secondary | ICD-10-CM

## 2018-08-15 NOTE — Therapy (Signed)
[name] [address] [place], [place], [address] Phone: [phone]   Fax:  [phone]  Occupational Therapy Treatment  Patient Details  Name: Angel CaoCatherine Silva MRN: [id number] Date of Birth: 06/16/1965 Referring Provider (OT): Dr. Floria RavelingBrian Waterman   Encounter Date: 08/15/2018  OT End of Session - 08/15/18 1624    Visit Number  14    Number of Visits  20    Date for OT Re-Evaluation  09/08/18    Authorization Type  BCBS    OT Start Time  1410    OT Stop Time  1457    OT Time Calculation (min)  47 min    Activity Tolerance  Patient tolerated treatment well    Behavior During Therapy  [facility] for tasks assessed/performed       Past Medical History:  Diagnosis Date  . Abnormal Pap smear of cervix    over 30 yrs ago  . Anxiety   . Chronic joint pain   . Depression   . GDM (gestational diabetes mellitus)    History  . IBS (irritable bowel syndrome)   . Reflux     Past Surgical History:  Procedure Laterality Date  . ABDOMINAL HYSTERECTOMY  2002   TAH- Fibroids , endometriosis; ov retained  . BACK SURGERY  2005   L5-S1 Fusion  . CESAREAN SECTION  1987  . ESOPHAGOGASTRODUODENOSCOPY    . LAPAROSCOPIC CHOLECYSTECTOMY  1995  . MASTOPEXY  2012  . REDUCTION MAMMAPLASTY Bilateral   . TONSILLECTOMY    . TUBAL LIGATION  1993   BTSP    There were no vitals filed for this visit.  Subjective Assessment - 08/15/18 1422    Subjective   S:  My left shoulder is feeling a little unstable today.    Currently in Pain?  No/denies         [facility] - 08/15/18 0001      Assessment   Medical Diagnosis  s/p left arthroscopic RC repair with mini open bicep tenodesis      Precautions   Precautions  Shoulder    Type of Shoulder Precautions  See protocol                OT Treatments/Exercises (OP) - 08/15/18 0001      Shoulder Exercises: Supine   Protraction  PROM;5 reps;AROM;15 reps    Horizontal ABduction  PROM;5  reps;AROM;15 reps    External Rotation  PROM;5 reps;AROM;15 reps    Internal Rotation  PROM;5 reps;AROM;15 reps    Flexion  PROM;5 reps;AROM;15 reps    ABduction  PROM;5 reps;AROM;15 reps      Shoulder Exercises: Seated   Protraction  AROM;12 reps    Horizontal ABduction  AROM;12 reps    Horizontal ABduction Limitations  with shoulder blade retracted     External Rotation  AROM;12 reps    External Rotation Weight (lbs)  abducted 90    Internal Rotation  AROM;12 reps    Internal Rotation Limitations  abducted 90     Flexion  AROM;12 reps    Abduction  AROM;12 reps      Shoulder Exercises: Prone   Other Prone Exercises  hughston exercises, 5 positions, 10X each      Shoulder Exercises: ROM/Strengthening   UBE (Upper Arm Bike)  level 1 3' forward and 3' reverse    "W" Arms  10X A/ROM    X to V Arms  12X A/ROM    Proximal Shoulder Strengthening,  Supine  15X; A/ROM no rest breaks verbal guidance for retracted shoulder blade     Ball on Wall  1' flexion 1' abduction               OT Short Term Goals - 08/09/18 1003      OT SHORT TERM GOAL #1   Title  Pt will be educated on HEP to improve mobility required for ADL and work tasks.    Time  4    Period  Weeks    Status  On-going    Target Date  07/13/18      OT SHORT TERM GOAL #2   Title  Pt will increase P/ROM of LUE to [facility] to improve ability to perform dressing tasks with minimal compensatory strategies.    Time  4    Period  Weeks    Status  Achieved      OT SHORT TERM GOAL #3   Title  Pt will increase LUE strength to 3-/5 to improve ability to reaching for items at waist level.    Time  4    Period  Weeks        OT Long Term Goals - 08/09/18 1004      OT LONG TERM GOAL #1   Title  Pt will return to highest level of functioning and independence in ADL, work, and leisure tasks using LUE as non-dominant.    Time  8    Period  Weeks    Status  On-going      OT LONG TERM GOAL #2   Title  Pt will decrease  pain in LUE to 2/10 or less to improve ability to sleep for 4 consecutive hours or longer at night.    Time  8    Period  Weeks    Status  On-going      OT LONG TERM GOAL #3   Title  Pt will decrease LUE fascial restrictions to minimal amounts or less to improve mobility required for functional reaching tasks.    Time  8    Period  Weeks      OT LONG TERM GOAL #4   Title  Pt will increase LUE A/ROM to [facility] to improve ability to reach overhead and behind back for dressing and bathing tasks.    Time  8    Period  Weeks    Status  Achieved      OT LONG TERM GOAL #5   Title  Pt will improve strength in LUE to 5/5 to improve ability to perform yoga poses and lift lightweight items at work.    Time  8    Period  Weeks    Status  On-going            Plan - 08/15/18 1624    Clinical Impression Statement  A:  focused on scapular stability and combined arm movement with shoulder blade retracted to improve shoulder stability this date.  Patient demonstrates 75% range with adducted and abducted external rotation, however, functional external rotation (reaching for back of head stargazer position) is WNL.    Body Structure / Function / Physical Skills  ADL;UE functional use;Fascial restriction;Pain;IADL;Scar mobility;ROM;Strength    Plan  P:  Attempt 1# resistance with strengthening in supine.  complete retracted a/rom with less discomfort.       Patient will benefit from skilled therapeutic intervention in order to improve the following deficits and impairments:   Body Structure / Function / Physical  Skills: ADL, UE functional use, Fascial restriction, Pain, IADL, Scar mobility, ROM, Strength       Visit Diagnosis: 1. Acute pain of left shoulder   2. Stiffness of left shoulder, not elsewhere classified   3. Other symptoms and signs involving the musculoskeletal system       Problem List Patient Active Problem List   Diagnosis Date Noted  . Tendinitis of both rotator cuffs  01/25/2017  . Generalized anxiety disorder 12/29/2014  . Chronic left shoulder pain 07/11/2013  . Insomnia 04/11/2013  . Atypical chest pain 01/17/2013  . Dyspnea 01/03/2013  . Esophageal reflux 05/30/2012  . Depression 05/30/2012  . Chronic cough 05/30/2012    Shirlean MylarBethany H. Gerlean Cid, MHA, OTR/L [phone]  08/15/2018, 4:30 PM  [facility] [address] [place], [place], [address] Phone: [phone]   Fax:  [phone]  Name: Angel CaoCatherine Mergenthaler MRN: [id number] Date of Birth: 08/08/1965

## 2018-08-16 ENCOUNTER — Encounter

## 2018-08-16 LAB — TESTT+TESTF+SHBG
Sex Hormone Binding: 112 nmol/L (ref 17.3–125.0)
Testosterone, Free: 2.2 pg/mL (ref 0.0–4.2)
Testosterone, Total, LC/MS: 28.4 ng/dL

## 2018-08-20 DIAGNOSIS — M7521 Bicipital tendinitis, right shoulder: Secondary | ICD-10-CM | POA: Insufficient documentation

## 2018-08-20 DIAGNOSIS — M25511 Pain in right shoulder: Secondary | ICD-10-CM | POA: Insufficient documentation

## 2018-08-23 ENCOUNTER — Ambulatory Visit (HOSPITAL_COMMUNITY): Payer: BC Managed Care – PPO | Admitting: Specialist

## 2018-08-26 ENCOUNTER — Ambulatory Visit (HOSPITAL_COMMUNITY): Payer: BC Managed Care – PPO | Admitting: Occupational Therapy

## 2018-08-26 ENCOUNTER — Other Ambulatory Visit: Payer: Self-pay

## 2018-08-26 ENCOUNTER — Encounter (HOSPITAL_COMMUNITY): Payer: Self-pay | Admitting: Occupational Therapy

## 2018-08-26 DIAGNOSIS — R29898 Other symptoms and signs involving the musculoskeletal system: Secondary | ICD-10-CM

## 2018-08-26 DIAGNOSIS — M25512 Pain in left shoulder: Secondary | ICD-10-CM | POA: Diagnosis not present

## 2018-08-26 DIAGNOSIS — M25612 Stiffness of left shoulder, not elsewhere classified: Secondary | ICD-10-CM

## 2018-08-26 NOTE — Therapy (Signed)
[facility] [address] [place], [place], [address] Phone: [phone]   Fax:  [phone]  Occupational Therapy Treatment  Patient Details  Name: Angel CaoCatherine Lacerda MRN: [id number] Date of Birth: 01/13/1965 Referring Provider (OT): Dr. Floria RavelingBrian Waterman   Encounter Date: 08/26/2018  OT End of Session - 08/26/18 1511    Visit Number  15    Number of Visits  20    Date for OT Re-Evaluation  09/08/18    Authorization Type  BCBS    OT Start Time  1430    OT Stop Time  1510    OT Time Calculation (min)  40 min    Activity Tolerance  Patient tolerated treatment well    Behavior During Therapy  [facility] for tasks assessed/performed       Past Medical History:  Diagnosis Date  . Abnormal Pap smear of cervix    over 30 yrs ago  . Anxiety   . Chronic joint pain   . Depression   . GDM (gestational diabetes mellitus)    History  . IBS (irritable bowel syndrome)   . Reflux     Past Surgical History:  Procedure Laterality Date  . ABDOMINAL HYSTERECTOMY  2002   TAH- Fibroids , endometriosis; ov retained  . BACK SURGERY  2005   L5-S1 Fusion  . CESAREAN SECTION  1987  . ESOPHAGOGASTRODUODENOSCOPY    . LAPAROSCOPIC CHOLECYSTECTOMY  1995  . MASTOPEXY  2012  . REDUCTION MAMMAPLASTY Bilateral   . TONSILLECTOMY    . TUBAL LIGATION  1993   BTSP    There were no vitals filed for this visit.  Subjective Assessment - 08/26/18 1430    Subjective   S: My left is feeling pretty good, my right is the one bothering me.    Currently in Pain?  No/denies         [facility] - 08/26/18 1430      Assessment   Medical Diagnosis  s/p left arthroscopic RC repair with mini open bicep tenodesis      Precautions   Precautions  Shoulder    Type of Shoulder Precautions  See protocol                OT Treatments/Exercises (OP) - 08/26/18 1433      Exercises   Exercises  Shoulder      Shoulder Exercises: Supine   Protraction  PROM;5  reps;Strengthening;10 reps    Protraction Weight (lbs)  1    Horizontal ABduction  PROM;5 reps;Strengthening;10 reps    Horizontal ABduction Weight (lbs)  1    External Rotation  PROM;5 reps;Strengthening;10 reps    External Rotation Weight (lbs)  1    Internal Rotation  PROM;5 reps;Strengthening;10 reps    Internal Rotation Weight (lbs)  1    Flexion  PROM;5 reps;Strengthening;10 reps    Shoulder Flexion Weight (lbs)  1    ABduction  PROM;5 reps;Strengthening;10 reps    Shoulder ABduction Weight (lbs)  1      Shoulder Exercises: Sidelying   External Rotation  Strengthening;10 reps    External Rotation Weight (lbs)  1    Internal Rotation  Strengthening;10 reps    Internal Rotation Weight (lbs)  1    Flexion  Strengthening;10 reps    Flexion Weight (lbs)  1    ABduction  Strengthening;10 reps    ABduction Weight (lbs)  1    Other Sidelying Exercises  protraction, 10X, 1#  Other Sidelying Exercises  horizontal abduction, 10X, 1#      Shoulder Exercises: Standing   Protraction  Theraband;10 reps    Theraband Level (Shoulder Protraction)  Level 2 (Red)    Horizontal ABduction  Theraband;10 reps    Theraband Level (Shoulder Horizontal ABduction)  Level 2 (Red)    External Rotation  Theraband;10 reps    Theraband Level (Shoulder External Rotation)  Level 2 (Red)    Internal Rotation  Theraband;10 reps    Theraband Level (Shoulder Internal Rotation)  Level 2 (Red)    Flexion  Theraband;10 reps    Theraband Level (Shoulder Flexion)  Level 2 (Red)    ABduction  Theraband;10 reps    Theraband Level (Shoulder ABduction)  Level 2 (Red)      Shoulder Exercises: Therapy Ball   Other Therapy Ball Exercises  Green therapy ball: Chest press, overhead press, flexion, circles, diagonals; 12X      Shoulder Exercises: ROM/Strengthening   UBE (Upper Arm Bike)  level 3 2' forward and 2' reverse    Other ROM/Strengthening Exercises  red loop band: lateral wall slides, diagonal clock slides  (11, 9, 7), 10X each               OT Short Term Goals - 08/09/18 1003      OT SHORT TERM GOAL #1   Title  Pt will be educated on HEP to improve mobility required for ADL and work tasks.    Time  4    Period  Weeks    Status  On-going    Target Date  07/13/18      OT SHORT TERM GOAL #2   Title  Pt will increase P/ROM of LUE to [facility] to improve ability to perform dressing tasks with minimal compensatory strategies.    Time  4    Period  Weeks    Status  Achieved      OT SHORT TERM GOAL #3   Title  Pt will increase LUE strength to 3-/5 to improve ability to reaching for items at waist level.    Time  4    Period  Weeks        OT Long Term Goals - 08/09/18 1004      OT LONG TERM GOAL #1   Title  Pt will return to highest level of functioning and independence in ADL, work, and leisure tasks using LUE as non-dominant.    Time  8    Period  Weeks    Status  On-going      OT LONG TERM GOAL #2   Title  Pt will decrease pain in LUE to 2/10 or less to improve ability to sleep for 4 consecutive hours or longer at night.    Time  8    Period  Weeks    Status  On-going      OT LONG TERM GOAL #3   Title  Pt will decrease LUE fascial restrictions to minimal amounts or less to improve mobility required for functional reaching tasks.    Time  8    Period  Weeks      OT LONG TERM GOAL #4   Title  Pt will increase LUE A/ROM to [facility] to improve ability to reach overhead and behind back for dressing and bathing tasks.    Time  8    Period  Weeks    Status  Achieved      OT LONG TERM GOAL #5  Title  Pt will improve strength in LUE to 5/5 to improve ability to perform yoga poses and lift lightweight items at work.    Time  8    Period  Weeks    Status  On-going            Plan - 08/26/18 1511    Clinical Impression Statement  A: Session focusing on shoulder and scapular strengthening and stability. Progressed to 1# weights in supine and completed in sidelying as well,  also completed shoulder strengthening and stability with red theraband exercises. Pt instructed not to use RUE during exercises unless necessary, as the right shoulder has been sore and painful recently.Occasional crunching in left shoulder during sidelying er and standing abduction with band, non-painful. Verbal cuing for form and technique.    Body Structure / Function / Physical Skills  ADL;UE functional use;Fascial restriction;Pain;IADL;Scar mobility;ROM;Strength    Plan  P: Continue with shoulder strengthening and stability and attempt to progress to 2# weight in supine.       Patient will benefit from skilled therapeutic intervention in order to improve the following deficits and impairments:   Body Structure / Function / Physical Skills: ADL, UE functional use, Fascial restriction, Pain, IADL, Scar mobility, ROM, Strength       Visit Diagnosis: Acute pain of left shoulder  Stiffness of left shoulder, not elsewhere classified  Other symptoms and signs involving the musculoskeletal system    Problem List Patient Active Problem List   Diagnosis Date Noted  . Tendinitis of both rotator cuffs 01/25/2017  . Generalized anxiety disorder 12/29/2014  . Chronic left shoulder pain 07/11/2013  . Insomnia 04/11/2013  . Atypical chest pain 01/17/2013  . Dyspnea 01/03/2013  . Esophageal reflux 05/30/2012  . Depression 05/30/2012  . Chronic cough 05/30/2012    Guadelupe Sabin, OTR/L  [phone] 08/26/2018, 3:15 PM  [place] [address] [place], [place], [address] Phone: [phone]   Fax:  [phone]  Name: Deannah Rossi MRN: [id number] Date of Birth: 01/20/1965

## 2018-08-27 ENCOUNTER — Encounter (HOSPITAL_COMMUNITY): Payer: Self-pay

## 2018-08-27 ENCOUNTER — Ambulatory Visit (HOSPITAL_COMMUNITY): Payer: BC Managed Care – PPO

## 2018-08-27 DIAGNOSIS — M25512 Pain in left shoulder: Secondary | ICD-10-CM

## 2018-08-27 DIAGNOSIS — Z9889 Other specified postprocedural states: Secondary | ICD-10-CM | POA: Insufficient documentation

## 2018-08-27 DIAGNOSIS — R29898 Other symptoms and signs involving the musculoskeletal system: Secondary | ICD-10-CM

## 2018-08-27 DIAGNOSIS — M25612 Stiffness of left shoulder, not elsewhere classified: Secondary | ICD-10-CM

## 2018-08-27 NOTE — Therapy (Signed)
[facility] [address] [place], [place], [address] Phone: [phone]   Fax:  [phone]  Occupational Therapy Treatment  Patient Details  Name: Angel CaoCatherine Silva MRN: [id number] Date of Birth: 02/23/1965 Referring Provider (OT): Dr. Floria RavelingBrian Waterman   Encounter Date: 08/27/2018  OT End of Session - 08/27/18 1713    Visit Number  16    Number of Visits  20    Date for OT Re-Evaluation  09/08/18    Authorization Type  BCBS    OT Start Time  1633    OT Stop Time  1706    OT Time Calculation (min)  33 min    Activity Tolerance  Patient tolerated treatment well    Behavior During Therapy  [facility] for tasks assessed/performed       Past Medical History:  Diagnosis Date  . Abnormal Pap smear of cervix    over 30 yrs ago  . Anxiety   . Chronic joint pain   . Depression   . GDM (gestational diabetes mellitus)    History  . IBS (irritable bowel syndrome)   . Reflux     Past Surgical History:  Procedure Laterality Date  . ABDOMINAL HYSTERECTOMY  2002   TAH- Fibroids , endometriosis; ov retained  . BACK SURGERY  2005   L5-S1 Fusion  . CESAREAN SECTION  1987  . ESOPHAGOGASTRODUODENOSCOPY    . LAPAROSCOPIC CHOLECYSTECTOMY  1995  . MASTOPEXY  2012  . REDUCTION MAMMAPLASTY Bilateral   . TONSILLECTOMY    . TUBAL LIGATION  1993   BTSP    There were no vitals filed for this visit.  Subjective Assessment - 08/27/18 1636    Subjective   S: I'm trying to wait till November to have surgery on my right shoulder.    Currently in Pain?  No/denies         [facility] - 08/27[facility]18 1636      Assessment   Medical Diagnosis  s/p left arthroscopic RC repair with mini open bicep tenodesis      Precautions   Precautions  Shoulder    Type of Shoulder Precautions  progress as tolerated               OT Treatments/Exercises (OP) - 08/27/18 1637      Exercises   Exercises  Shoulder      Shoulder Exercises: Supine   Protraction  Strengthening;12 reps    Protraction Weight (lbs)  2    Horizontal ABduction  Strengthening;12 reps    Horizontal ABduction Weight (lbs)  2    External Rotation  PROM;5 reps;Strengthening;12 reps   abducted (P/ROM)   External Rotation Weight (lbs)  2    Internal Rotation  PROM;5 reps;Strengthening;12 reps   abducted (P/ROM)   Internal Rotation Weight (lbs)  2    Flexion  Strengthening;12 reps    Shoulder Flexion Weight (lbs)  2    ABduction  Strengthening;12 reps    Shoulder ABduction Weight (lbs)  2      Shoulder Exercises: Standing   Protraction  Theraband;10 reps    Theraband Level (Shoulder Protraction)  Level 2 (Red)    Horizontal ABduction  Theraband;10 reps    Theraband Level (Shoulder Horizontal ABduction)  Level 2 (Red)    External Rotation  Theraband;10 reps    Theraband Level (Shoulder External Rotation)  Level 2 (Red)    Internal Rotation  Theraband;10 reps    Theraband Level (Shoulder Internal Rotation)  Level 2 (Red)    Flexion  Theraband;10 reps    Theraband Level (Shoulder Flexion)  Level 2 (Red)    ABduction  Theraband;10 reps    Theraband Level (Shoulder ABduction)  Level 2 (Red)      Shoulder Exercises: ROM/Strengthening   UBE (Upper Arm Bike)  level 3 2' forward and 2' reverse    Over Head Lace  2' seated    "W" Arms  10X with 1# in left hand.     X to V Arms  10X with 1# in left hand    Proximal Shoulder Strengthening, Supine  12X 2# in left hand; no rest breaks    Ball on Wall  1' flexion 1' abduction    Other ROM/Strengthening Exercises  red band loop: 10X each number for wall slide (7,9,11)               OT Short Term Goals - 08/27/18 1714      OT SHORT TERM GOAL #1   Title  Pt will be educated on HEP to improve mobility required for ADL and work tasks.    Time  4    Period  Weeks    Status  On-going    Target Date  07/13/18      OT SHORT TERM GOAL #2   Title  Pt will increase P/ROM of LUE to [facility] to improve ability to  perform dressing tasks with minimal compensatory strategies.    Time  4    Period  Weeks      OT SHORT TERM GOAL #3   Title  Pt will increase LUE strength to 3-/5 to improve ability to reaching for items at waist level.    Time  4    Period  Weeks        OT Long Term Goals - 08/27/18 1715      OT LONG TERM GOAL #1   Title  Pt will return to highest level of functioning and independence in ADL, work, and leisure tasks using LUE as non-dominant.    Time  8    Period  Weeks    Status  On-going      OT LONG TERM GOAL #2   Title  Pt will decrease pain in LUE to 2/10 or less to improve ability to sleep for 4 consecutive hours or longer at night.    Time  8    Period  Weeks    Status  On-going      OT LONG TERM GOAL #3   Title  Pt will decrease LUE fascial restrictions to minimal amounts or less to improve mobility required for functional reaching tasks.    Time  8    Period  Weeks      OT LONG TERM GOAL #4   Title  Pt will increase LUE A/ROM to [facility] to improve ability to reach overhead and behind back for dressing and bathing tasks.    Time  8    Period  Weeks      OT LONG TERM GOAL #5   Title  Pt will improve strength in LUE to 5/5 to improve ability to perform yoga poses and lift lightweight items at work.    Time  8    Period  Weeks    Status  On-going            Plan - 08/27/18 1705    Clinical Impression Statement  A: Patient was able to complete all  supine exercises with 2# hand weight. Focused on shoulder strengthening standing with red theraband. Patient demonstrated good form when provided VC for form and technique. No passive stretching needed during session except for external rotation.    Body Structure / Function / Physical Skills  ADL;UE functional use;Fascial restriction;Pain;IADL;Scar mobility;ROM;Strength    Plan  P: D/C passive stretching except for external rotation with shoulder abducted. Continue with shoulder and scapular strengthening; be careful of  pain in right shoulder and modify as needed.    Consulted and Agree with Plan of Care  Patient       Patient will benefit from skilled therapeutic intervention in order to improve the following deficits and impairments:   Body Structure / Function / Physical Skills: ADL, UE functional use, Fascial restriction, Pain, IADL, Scar mobility, ROM, Strength       Visit Diagnosis: Stiffness of left shoulder, not elsewhere classified  Other symptoms and signs involving the musculoskeletal system  Acute pain of left shoulder    Problem List Patient Active Problem List   Diagnosis Date Noted  . Tendinitis of both rotator cuffs 01/25/2017  . Generalized anxiety disorder 12/29/2014  . Chronic left shoulder pain 07/11/2013  . Insomnia 04/11/2013  . Atypical chest pain 01/17/2013  . Dyspnea 01/03/2013  . Esophageal reflux 05/30/2012  . Depression 05/30/2012  . Chronic cough 05/30/2012   Angel Silva, OTR/L,CBIS  [phone]  08/27/2018, 5:15 PM  [place] [address] [place], [place], [address] Phone: [phone]   Fax:  [phone]  Name: Angel Silva MRN: [id number] Date of Birth: 04-Jun-1965

## 2018-08-31 ENCOUNTER — Encounter: Payer: Self-pay | Admitting: Obstetrics and Gynecology

## 2018-09-02 ENCOUNTER — Telehealth: Payer: Self-pay | Admitting: Obstetrics and Gynecology

## 2018-09-02 MED ORDER — NONFORMULARY OR COMPOUNDED ITEM: 0 refills | Status: DC

## 2018-09-02 NOTE — Telephone Encounter (Signed)
Spoke with patient. Patient advised as seen below. Patient states she would like for prescription to go to [facility]. Prescription to Dr. Gentry Fitz desk to review and sign for fax.

## 2018-09-02 NOTE — Telephone Encounter (Signed)
Rx pended for testosterone cream.  Confirmed with Dr. Talbert Nan dispense 30 grams.  Waiting for return call from patient to advise on compounding pharmacy.

## 2018-09-02 NOTE — Telephone Encounter (Signed)
Left message to call Sharee Pimple, RN at [facility].  Called to review compounding pharmacy options.    Patient was seen in office on 08/14/18. Testosterone levels in normal range, results via MyChart, topical testosterone offered when off trazodone.    Dr. Talbert Nan -please advise on compounded Testosterone cream RX.

## 2018-09-02 NOTE — Telephone Encounter (Signed)
Prescription for Testosterone cream faxed to [facility] in [place]. Fax number: [phone].   Will close encounter.

## 2018-09-02 NOTE — Telephone Encounter (Signed)
Please call in 1% testosterone cream, apply 0.5 grams daily to the lower abdomen. #30, no refills. She should have a repeat testosterone panel one month after starting the medication.

## 2018-09-02 NOTE — Telephone Encounter (Signed)
Patient sent the following correspondence through [facility].  Hi Dr Talbert Nan,  I have weaned myself off of the Trazodone. I would really like to try the topical testosterone cream. Would you be able to call that in to the [facility]?  Thank you so much.  [employer]

## 2018-09-04 ENCOUNTER — Ambulatory Visit (HOSPITAL_COMMUNITY): Payer: BC Managed Care – PPO | Attending: Orthopaedic Surgery | Admitting: Occupational Therapy

## 2018-09-05 ENCOUNTER — Ambulatory Visit (HOSPITAL_COMMUNITY): Payer: BC Managed Care – PPO

## 2018-09-05 ENCOUNTER — Telehealth (HOSPITAL_COMMUNITY): Payer: Self-pay

## 2018-09-05 NOTE — Telephone Encounter (Signed)
Attempted to call patient in regards to no show. No answer and mailbox is full so unable to leave a message. Will send a e-mail letting patient know that today was her last appointment. Plan was to reassess and discharge. Ask her to call clinic and reschedule if she would like to otherwise we'll go ahead and discharge.    Ailene Ravel, OTR/L,CBIS  [phone]

## 2018-09-10 ENCOUNTER — Encounter (HOSPITAL_COMMUNITY): Payer: Self-pay

## 2018-09-10 NOTE — Therapy (Signed)
[facility] [place], [place], [address] Phone: [phone]   Fax:  [phone]  Patient Details  Name: Angel Silva MRN: [id number] Date of Birth: 12/09/1965 Referring Provider:  No ref. provider found  Encounter Date: 09/10/2018  OCCUPATIONAL THERAPY DISCHARGE SUMMARY  Visits from Start of Care: 16  Current functional level related to goals / functional outcomes: OT LONG TERM GOAL #1    Title  Pt will return to highest level of functioning and independence in ADL, work, and leisure tasks using LUE as non-dominant.    Time  8    Period  Weeks    Status  On-going        OT LONG TERM GOAL #2   Title  Pt will decrease pain in LUE to 2/10 or less to improve ability to sleep for 4 consecutive hours or longer at night.    Time  8    Period  Weeks    Status  On-going        OT LONG TERM GOAL #3   Title  Pt will decrease LUE fascial restrictions to minimal amounts or less to improve mobility required for functional reaching tasks.    Time  8    Period  Weeks        OT LONG TERM GOAL #4   Title  Pt will increase LUE A/ROM to [facility] to improve ability to reach overhead and behind back for dressing and bathing tasks.    Time  8    Period  Weeks        OT LONG TERM GOAL #5   Title  Pt will improve strength in LUE to 5/5 to improve ability to perform yoga poses and lift lightweight items at work.    Time  8    Period  Weeks    Status  On-going       Remaining deficits: Patient did not show for last scheduled OT appointment. Attempted to call patient although voicemail was full. Sent patient an e-mail. Requested her to contact clinic if she would like to reschedule her discharge appointment to take final measurements and review goals and HEP. Pt did not contact clinic and she will be discharged with final ROM and strength measurements unknown. Based on performance during treatment sessions, patient would  have met all therapy goals.    Education / Equipment: Manufacturing engineer HEP Plan: Patient agrees to discharge.  Patient goals were met. Patient is being discharged due to not returning since the last visit.  ?????          Ailene Ravel, OTR/L,CBIS  [phone]  09/10/2018, 8:57 AM  [facility] [address] [place], [place], [address] Phone: [phone]   Fax:  [phone]

## 2018-09-30 ENCOUNTER — Other Ambulatory Visit: Payer: BC Managed Care – PPO

## 2018-10-07 ENCOUNTER — Other Ambulatory Visit: Payer: Self-pay

## 2018-10-07 ENCOUNTER — Other Ambulatory Visit (INDEPENDENT_AMBULATORY_CARE_PROVIDER_SITE_OTHER): Payer: BC Managed Care – PPO

## 2018-10-07 ENCOUNTER — Other Ambulatory Visit: Payer: Self-pay | Admitting: *Deleted

## 2018-10-07 DIAGNOSIS — F5231 Female orgasmic disorder: Secondary | ICD-10-CM

## 2018-10-07 DIAGNOSIS — IMO0002 Reserved for concepts with insufficient information to code with codable children: Secondary | ICD-10-CM

## 2018-10-11 LAB — TESTT+TESTF+SHBG
Sex Hormone Binding: 107 nmol/L (ref 17.3–125.0)
Testosterone, Free: 1.3 pg/mL (ref 0.0–4.2)
Testosterone, Total, LC/MS: 71.8 ng/dL

## 2018-10-14 ENCOUNTER — Telehealth: Payer: Self-pay

## 2018-10-14 MED ORDER — NONFORMULARY OR COMPOUNDED ITEM: 0 refills | Status: DC

## 2018-10-14 NOTE — Telephone Encounter (Signed)
Spoke with patient. Results given. Patient verbalizes understanding. Lab appointment scheduled for 11/22/2018 at 3:45 pm. Patient is agreeable to date and time. Order for 0.5% testosterone cream, she should put 0.5 grams on her lower abdomen daily and then return in one month for repeat testosterone panel. #30 grams, no refills sent to [facility]. Patient is agreeable.

## 2018-10-14 NOTE — Telephone Encounter (Signed)
Left message to call Atticus Lemberger at [phone]. 

## 2018-10-14 NOTE — Telephone Encounter (Signed)
-----   Message from Salvadore Dom, MD sent at 10/11/2018  4:00 PM EDT ----- Please let the patient know that her total testosterone level is too high. Please call her pharmacy and ask them to make a 0.5% testosterone cream, she should put 0.5 grams on her lower abdomen daily and then return in one month for repeat testosterone panel. #30 grams, no refills.

## 2018-10-28 ENCOUNTER — Ambulatory Visit: Payer: BLUE CROSS/BLUE SHIELD | Admitting: Obstetrics and Gynecology

## 2018-10-31 ENCOUNTER — Ambulatory Visit: Payer: BLUE CROSS/BLUE SHIELD | Admitting: Obstetrics and Gynecology

## 2018-11-01 ENCOUNTER — Ambulatory Visit: Payer: BLUE CROSS/BLUE SHIELD | Admitting: Certified Nurse Midwife

## 2018-11-04 ENCOUNTER — Other Ambulatory Visit: Payer: BLUE CROSS/BLUE SHIELD

## 2018-11-07 ENCOUNTER — Other Ambulatory Visit: Payer: Self-pay

## 2018-11-07 ENCOUNTER — Other Ambulatory Visit: Payer: Self-pay | Admitting: Obstetrics and Gynecology

## 2018-11-07 ENCOUNTER — Ambulatory Visit
Admission: RE | Admit: 2018-11-07 | Discharge: 2018-11-07 | Disposition: A | Discharge status: Home / Self Care | Payer: BC Managed Care – PPO | Source: Ambulatory Visit | Attending: Obstetrics and Gynecology | Admitting: Obstetrics and Gynecology

## 2018-11-07 DIAGNOSIS — N61 Mastitis without abscess: Secondary | ICD-10-CM

## 2018-11-22 ENCOUNTER — Other Ambulatory Visit: Payer: BC Managed Care – PPO

## 2018-11-24 ENCOUNTER — Encounter: Payer: Self-pay | Admitting: Obstetrics and Gynecology

## 2018-11-25 ENCOUNTER — Encounter: Payer: Self-pay | Admitting: Obstetrics and Gynecology

## 2018-11-25 ENCOUNTER — Telehealth: Payer: Self-pay | Admitting: Obstetrics and Gynecology

## 2018-11-25 NOTE — Telephone Encounter (Signed)
Spoke with pt. Pt states  Having yeast sx since Thursday and over the weekend of having itching and discharge. Pt took Monistat over the weekend and now states having no sx this morning. Denies OV at this time. Pt encouraged to call back to office if sx return. Pt agreeable.   Will route to Dr Talbert Nan for [facility], will close encounter.

## 2018-11-25 NOTE — Telephone Encounter (Signed)
Good morning,   I have had symptoms of a yeast infection since Thursday. I have used over the counter meds and the discharge has reduced greatly the itching seems to be worse. Could you possibly call in a prescription for me? I just had shoulder surgery on the 13th & Im not allowed to drive. We also have family flying in from [place] for the week. It would be difficult for me to come in for a visit but if you feel thats best Ill find some way of getting there.   Thank you so much   [facility]

## 2018-11-25 NOTE — Telephone Encounter (Signed)
Non-Urgent Medical Question Received: Today Message Contents  [name] "Angel Silva" sent to [facility]  Phone Number: [phone]        Good morning   All of my symptoms of a yeast infection are gone this morning. No need for a prescription now.    Thank you   Angel Silva

## 2018-12-06 ENCOUNTER — Encounter: Payer: Self-pay | Admitting: Nurse Practitioner

## 2018-12-06 ENCOUNTER — Other Ambulatory Visit: Payer: Self-pay

## 2018-12-06 ENCOUNTER — Ambulatory Visit (INDEPENDENT_AMBULATORY_CARE_PROVIDER_SITE_OTHER): Payer: BC Managed Care – PPO | Admitting: Nurse Practitioner

## 2018-12-06 DIAGNOSIS — K5909 Other constipation: Secondary | ICD-10-CM

## 2018-12-06 NOTE — Progress Notes (Signed)
  PHONE VISIT Subjective:    Patient ID: Angel Silva, female    DOB: August 30, 1965, 53 y.o.   MRN: [id number]  HPI  Patient calls to discuss constipation. Patient had recent shoulder surgery 3 weeks ago. The pain medication seems to make her constipated. Patient has a lot of pressure in her rectal area but cant go to bathroom.Patient also had a bout of colitis 5 months ago with diarrhea and blood in stool.  Virtual Visit via Video Note  I connected with Angel Silva on 12/06/18 at 11:20 AM EST by a video enabled telemedicine application and verified that I am speaking with the correct person using two identifiers.  Location: Patient: home Provider: office   I discussed the limitations of evaluation and management by telemedicine and the availability of in person appointments. The patient expressed understanding and agreed to proceed.  History of Present Illness: See note above.  Patient has chronic constipation.  Has been much worse since her shoulder surgery on 11/13.  States she has only had 2-3 bowel movements since her surgery.  Tried to have a BM last night, no results only mucus.  Has tried 1-3 stool softeners at a time.  Symptoms were "bad" last night describing uncomfortable pressure.  No fevers.  No vomiting.  Does take her pain medication once a day.   Observations/Objective: Today's visit was via telephone Physical exam was not possible for this visit Based on phone conversation, patient is not in any acute distress.  Calm affect.  Thoughts logical coherent and relevant.  Assessment and Plan: Problem List Items Addressed This Visit      Digestive   Chronic constipation - Primary       Follow Up Instructions: A combination of having chronic constipation, surgery and the use of opiate pain medication has probably contributed to her current case of constipation.   Recommend starting with one of the following regimens: Fleets enema as directed Magnesium citrate as  directed MiraLAX 1 scoop mixed in 8 ounces of fluid each hour up to 6 hours, may repeat the next day if needed. Also after visit patient was sent another regimen for Dulcolax/MiraLAX used by our office. Patient to try 1 regimen at a time to see if any of these will work as far as a significant bowel movement.  Warning signs reviewed including signs of bowel obstruction.  Patient to call back Monday if no improvement, go to ED over the weekend if worse. Also recommend looking at daily medication to prevent chronic constipation in the future.   I discussed the assessment and treatment plan with the patient. The patient was provided an opportunity to ask questions and all were answered. The patient agreed with the plan and demonstrated an understanding of the instructions.   The patient was advised to call back or seek an in-person evaluation if the symptoms worsen or if the condition fails to improve as anticipated.  I provided 15 minutes of non-face-to-face time during this encounter.     Review of Systems     Objective:   Physical Exam        Assessment & Plan:

## 2018-12-07 ENCOUNTER — Encounter: Payer: Self-pay | Admitting: Nurse Practitioner

## 2018-12-10 NOTE — Progress Notes (Signed)
53 y.o. [id number] Divorced White or Caucasian Not Hispanic or Latino female here for annual exam.  Patient had a CT scan in the ER and noticed a small ovarian cyst noted on her ovary would like to discuss. CT scan in Epic. Described as small right ovarian cysts/remnant follicles.  The patient was seen in the ER yesterday and was diagnosed with colitis and proctitis, question ulcerative colitis. She has had a colonoscopy earlier this year and there were no signs of ulcerative colitis at that time. She is feeling better on antibiotics.   She has had vasomotor symptoms for years, they wax and wane. Currently mild. She is using vaginal estrogen for dryness. No dyspareunia.   She stopped using the testosterone cream, it was definitely different. Her testosterone level was slightly high in 10/20, she was told to decrease her dose, but stopped it. She would like to restart it. She has the lower dose at home, 0.5% testosterone cream, 0.5 grams daily.     Patient's last menstrual period was 01/03/2000.          Sexually active: Yes.    The current method of family planning is post hysterectomy. Exercising: No.  The patient does not participate in regular exercise at present. Smoker:  no  Health Maintenance: Pap:  2012 neg History of Abnormal Pap: no MMG:  11/07/2018 BI-RADS CATEGORY  2: Benign Colonoscopy:  7/20, was told she needed f/u in 1 year (inadequate prep) BMD:   unsure TDaP:  2014 Gardasil: N/A   reports that she has quit smoking. She has never used smokeless tobacco. She reports current alcohol use. She reports that she does not use drugs. Just occasional ETOH. Lives with her boyfriend, very happy. She works in Electrical engineer, looking for new work. She has had shoulder injuries from work and has needed bilateral shoulder surgery.   Past Medical History:  Diagnosis Date  . Abnormal Pap smear of cervix    over 30 yrs ago  . Anxiety   . Chronic joint pain   . Colitis   .  Depression   . IBS (irritable bowel syndrome)   . Reflux     Past Surgical History:  Procedure Laterality Date  . ABDOMINAL HYSTERECTOMY  2002   TAH- Fibroids , endometriosis; ov retained  . BACK SURGERY  2005   L5-S1 Fusion  . CESAREAN SECTION  1987  . ESOPHAGOGASTRODUODENOSCOPY    . LAPAROSCOPIC CHOLECYSTECTOMY  1995  . MASTOPEXY  2012  . REDUCTION MAMMAPLASTY Bilateral   . TONSILLECTOMY    . TUBAL LIGATION  1993   BTSP    Current Outpatient Medications  Medication Sig Dispense Refill  . albuterol (PROVENTIL HFA;VENTOLIN HFA) 108 (90 Base) MCG/ACT inhaler Inhale 2 puffs into the lungs every 6 (six) hours as needed for wheezing or shortness of breath. 1 Inhaler 2  . amoxicillin-clavulanate (AUGMENTIN) 875-125 MG tablet Take 1 tablet by mouth 2 (two) times daily. One po bid x 7 days 14 tablet 0  . Boswellia-Glucosamine-Vit D (OSTEO BI-FLEX ONE PER DAY PO) Take by mouth.    . clonazePAM (KLONOPIN) 1 MG tablet Take 1 tablet (1 mg total) by mouth daily as needed. 30 tablet 5  . Estradiol 10 MCG TABS vaginal tablet Place 1 tablet (10 mcg total) vaginally 2 (two) times a week. 24 tablet 0  . ibuprofen (ADVIL,MOTRIN) 800 MG tablet Take 1 tablet (800 mg total) by mouth every 8 (eight) hours as needed. 30 tablet 1  . Multiple  Vitamin (MULTIVITAMIN) tablet Take 1 tablet by mouth daily.    . Probiotic Product (PROBIOTIC PO) Take by mouth.    . traZODone (DESYREL) 50 MG tablet Take 1-2 tablets (50-100 mg total) by mouth at bedtime as needed for sleep. 45 tablet 5  . NONFORMULARY OR COMPOUNDED ITEM 0.5%Testosterone cream, apply 0.5 gram daily to the lower abdomen. Dispense 30 grams, no refills. (Patient not taking: Reported on 12/13/2018) 1 each 0   No current facility-administered medications for this visit.    Family History  Problem Relation Age of Onset  . Hypertension Mother   . Diabetes Mother        Type 2  . Thyroid disease Mother   . Glaucoma Mother   . Hypertension Father    . Atrial fibrillation Father   . Cancer Father        bladder & liver  . Heart disease Father   . Crohn's disease Father   . Heart attack Maternal Grandfather   . Diabetes Maternal Grandmother        Type 2  . Hypertension Maternal Grandmother   . Heart disease Maternal Grandmother   . Heart disease Paternal Grandmother   . CAD Maternal Uncle   . Hypertension Brother   . Melanoma Brother   . Heart murmur Brother   . Other Brother        leaky aortic valve  . Breast cancer Neg Hx     Review of Systems  Constitutional: Negative.   HENT: Negative.   Eyes: Negative.   Respiratory: Negative.   Cardiovascular: Negative.   Gastrointestinal: Negative.   Endocrine: Negative.   Genitourinary: Negative.   Musculoskeletal: Negative.   Skin: Negative.   Allergic/Immunologic: Negative.   Neurological: Negative.   Hematological: Negative.   Psychiatric/Behavioral: Negative.     Exam:   LMP 01/03/2000   Weight change: @WEIGHTCHANGE @ Height:      Ht Readings from Last 3 Encounters:  12/12/18 5\' 3"  (1.6 m)  08/14/18 5\' 3"  (1.6 m)  05/22/18 5\' 3"  (1.6 m)    General appearance: alert, cooperative and appears stated age Head: Normocephalic, without obvious abnormality, atraumatic Neck: no adenopathy, supple, symmetrical, trachea midline and thyroid normal to inspection and palpation Lungs: clear to auscultation bilaterally Cardiovascular: regular rate and rhythm Breasts: normal appearance, no masses or tenderness Abdomen: soft, non-tender; non distended,  no masses,  no organomegaly Extremities: extremities normal, atraumatic, no cyanosis or edema Skin: Skin color, texture, turgor normal. No rashes or lesions Lymph nodes: Cervical, supraclavicular, and axillary nodes normal. No abnormal inguinal nodes palpated Neurologic: Grossly normal   Pelvic: External genitalia:  no lesions              Urethra:  normal appearing urethra with no masses, tenderness or lesions               Bartholins and Skenes: normal                 Vagina: normal appearing vagina with normal color and discharge, no lesions              Cervix: absent               Bimanual Exam:  Uterus:  uterus absent              Adnexa: no mass, fullness, tenderness               Rectovaginal: fullness noted in the left adnexa, suspect stool.  Anus:  normal sphincter tone, no lesions  Chaperone was present for exam.  A:  Well Woman with normal exam  H/O low libido, helped with testosterone cream  Vaginal atrophy, helped with vaginal estrogen  Vit d def  FH of DM  Fullness on pelvic exam, suspect stool.   P:   She will restart on the lower dose testosterone cream and then have a testosterone panel in one month.   Continue with vaginal estrogen.   Mammogram UTD  Is following up with GI this week  Return in one week for repeat pelvic exam  Return in one month for testosterone panel, vit D, lipid panel and HgbA1C. Other lab work is UTD

## 2018-12-11 ENCOUNTER — Other Ambulatory Visit: Payer: Self-pay

## 2018-12-11 DIAGNOSIS — R109 Unspecified abdominal pain: Secondary | ICD-10-CM | POA: Diagnosis present

## 2018-12-11 DIAGNOSIS — Z79899 Other long term (current) drug therapy: Secondary | ICD-10-CM | POA: Diagnosis not present

## 2018-12-11 DIAGNOSIS — Z87891 Personal history of nicotine dependence: Secondary | ICD-10-CM | POA: Diagnosis not present

## 2018-12-11 DIAGNOSIS — K529 Noninfective gastroenteritis and colitis, unspecified: Secondary | ICD-10-CM | POA: Diagnosis not present

## 2018-12-12 ENCOUNTER — Emergency Department (HOSPITAL_COMMUNITY)
Admission: EM | Admit: 2018-12-12 | Discharge: 2018-12-12 | Disposition: A | Discharge status: Home / Self Care | Payer: BC Managed Care – PPO | Attending: Emergency Medicine | Admitting: Emergency Medicine

## 2018-12-12 ENCOUNTER — Emergency Department (HOSPITAL_COMMUNITY): Payer: BC Managed Care – PPO

## 2018-12-12 ENCOUNTER — Encounter (HOSPITAL_COMMUNITY): Payer: Self-pay | Admitting: Emergency Medicine

## 2018-12-12 DIAGNOSIS — K529 Noninfective gastroenteritis and colitis, unspecified: Secondary | ICD-10-CM

## 2018-12-12 LAB — CBC
HCT: 42.8 % (ref 36.0–46.0)
Hemoglobin: 14.8 g/dL (ref 12.0–15.0)
MCH: 31.5 pg (ref 26.0–34.0)
MCHC: 34.6 g/dL (ref 30.0–36.0)
MCV: 91.1 fL (ref 80.0–100.0)
Platelets: 305 10*3/uL (ref 150–400)
RBC: 4.7 MIL/uL (ref 3.87–5.11)
RDW: 12 % (ref 11.5–15.5)
WBC: 11.3 10*3/uL — ABNORMAL HIGH (ref 4.0–10.5)
nRBC: 0 % (ref 0.0–0.2)

## 2018-12-12 LAB — URINALYSIS, ROUTINE W REFLEX MICROSCOPIC
Bilirubin Urine: NEGATIVE
Glucose, UA: NEGATIVE mg/dL
Hgb urine dipstick: NEGATIVE
Ketones, ur: NEGATIVE mg/dL
Leukocytes,Ua: NEGATIVE
Nitrite: NEGATIVE
Protein, ur: NEGATIVE mg/dL
Specific Gravity, Urine: >1.046 — ABNORMAL HIGH (ref 1.005–1.030)
pH: 6 (ref 5.0–8.0)

## 2018-12-12 LAB — COMPREHENSIVE METABOLIC PANEL
ALT: 20 U/L (ref 0–44)
AST: 18 U/L (ref 15–41)
Albumin: 4.4 g/dL (ref 3.5–5.0)
Alkaline Phosphatase: 74 U/L (ref 38–126)
Anion gap: 14 (ref 5–15)
BUN: 10 mg/dL (ref 6–20)
CO2: 22 mmol/L (ref 22–32)
Calcium: 9.5 mg/dL (ref 8.9–10.3)
Chloride: 102 mmol/L (ref 98–111)
Creatinine, Ser: 0.64 mg/dL (ref 0.44–1.00)
GFR calc Af Amer: >60 mL/min (ref >60)
GFR calc non Af Amer: >60 mL/min (ref >60)
Glucose, Bld: 137 mg/dL — ABNORMAL HIGH (ref 70–99)
Potassium: 3.6 mmol/L (ref 3.5–5.1)
Sodium: 138 mmol/L (ref 135–145)
Total Bilirubin: 1.4 mg/dL — ABNORMAL HIGH (ref 0.3–1.2)
Total Protein: 7.3 g/dL (ref 6.5–8.1)

## 2018-12-12 LAB — C DIFFICILE QUICK SCREEN W PCR REFLEX
C Diff antigen: NEGATIVE
C Diff interpretation: NOT DETECTED
C Diff toxin: NEGATIVE

## 2018-12-12 LAB — LIPASE, BLOOD: Lipase: 26 U/L (ref 11–51)

## 2018-12-12 MED ORDER — AMOXICILLIN-POT CLAVULANATE 875-125 MG PO TABS: 1.0000 | ORAL_TABLET | Freq: Two times a day (BID) | ORAL | 0 refills | Status: DC

## 2018-12-12 MED ORDER — SODIUM CHLORIDE 0.9% FLUSH: 3.0000 mL | Freq: Once | INTRAVENOUS | Status: DC

## 2018-12-12 MED ORDER — IOHEXOL 300 MG/ML  SOLN
100.0000 mL | Freq: Once | INTRAMUSCULAR | Status: AC | PRN
  Administered 2018-12-12: 100 mL via INTRAVENOUS

## 2018-12-12 NOTE — Discharge Instructions (Addendum)
Take antibiotics as directed and follow-up close with your primary doctor and gastroenterology.  Return for fevers, uncontrolled pain, uncontrolled vomiting or new concerns.

## 2018-12-12 NOTE — ED Triage Notes (Signed)
Pt with nausea, vomiting, and abdominal pain since yesterday afternoon. Pt states she has not had a normal BM in weeks. Pt states she had a Fleets enema and 3 dulcolax tabs today without results.

## 2018-12-12 NOTE — ED Notes (Signed)
Pt up to bathroom and back to room 

## 2018-12-12 NOTE — ED Provider Notes (Signed)
[facility]   CSN: [id number] Arrival date & time: 12/11/18  2309     History Chief Complaint  Patient presents with  . Abdominal Pain    Clovis CaoCatherine Edgett is a 53 y.o. female.  Patient presents to the emergency department for evaluation of abdominal pain, abdominal distention and constipation.  She has not been able to have a normal bowel movement for several weeks.  Since yesterday she has had increased symptoms of pain and feeling like she needed to have a bowel movement but could not.  She then developed acute nausea and vomiting which prompted her to come to the ER.  She denies hematemesis, rectal bleeding and melena.  She did try a fleets and 3 Dulcolax's at home without improvement.        Past Medical History:  Diagnosis Date  . Abnormal Pap smear of cervix    over 30 yrs ago  . Anxiety   . Chronic joint pain   . Depression   . IBS (irritable bowel syndrome)   . Reflux     Patient Active Problem List   Diagnosis Date Noted  . Chronic constipation 12/06/2018  . Tendinitis of both rotator cuffs 01/25/2017  . Generalized anxiety disorder 12/29/2014  . Chronic left shoulder pain 07/11/2013  . Insomnia 04/11/2013  . Atypical chest pain 01/17/2013  . Dyspnea 01/03/2013  . Esophageal reflux 05/30/2012  . Depression 05/30/2012  . Chronic cough 05/30/2012    Past Surgical History:  Procedure Laterality Date  . ABDOMINAL HYSTERECTOMY  2002   TAH- Fibroids , endometriosis; ov retained  . BACK SURGERY  2005   L5-S1 Fusion  . CESAREAN SECTION  1987  . ESOPHAGOGASTRODUODENOSCOPY    . LAPAROSCOPIC CHOLECYSTECTOMY  1995  . MASTOPEXY  2012  . REDUCTION MAMMAPLASTY Bilateral   . TONSILLECTOMY    . TUBAL LIGATION  1993   BTSP     OB History    Gravida  5   Para  3   Term  1   Preterm  2   AB  2   Living  3     SAB      TAB  1   Ectopic      Multiple      Live Births  3           Family History  Problem  Relation Age of Onset  . Hypertension Mother   . Diabetes Mother        Type 2  . Thyroid disease Mother   . Glaucoma Mother   . Hypertension Father   . Atrial fibrillation Father   . Cancer Father        bladder & liver  . Heart disease Father   . Crohn's disease Father   . Heart attack Maternal Grandfather   . Diabetes Maternal Grandmother        Type 2  . Hypertension Maternal Grandmother   . Heart disease Maternal Grandmother   . Heart disease Paternal Grandmother   . CAD Maternal Uncle   . Hypertension Brother   . Melanoma Brother   . Heart murmur Brother   . Other Brother        leaky aortic valve  . Breast cancer Neg Hx     Social History   Tobacco Use  . Smoking status: Former Games developermoker  . Smokeless tobacco: Never Used  Substance Use Topics  . Alcohol use: Yes    Alcohol/week: 0.0 -  3.0 standard drinks  . Drug use: No    Home Medications Prior to Admission medications   Medication Sig Start Date End Date Taking? Authorizing Provider  albuterol (PROVENTIL HFA;VENTOLIN HFA) 108 (90 Base) MCG/ACT inhaler Inhale 2 puffs into the lungs every 6 (six) hours as needed for wheezing or shortness of breath. 09/19/17   Merlyn Albert, MD  Boswellia-Glucosamine-Vit D (OSTEO BI-FLEX ONE PER DAY PO) Take by mouth.    [provider]  clonazePAM (KLONOPIN) 1 MG tablet Take 1 tablet (1 mg total) by mouth daily as needed. 07/02/18   Merlyn Albert, MD  Estradiol 10 MCG TABS vaginal tablet Place 1 tablet (10 mcg total) vaginally 2 (two) times a week. 08/15/18   Romualdo Bolk, MD  ibuprofen (ADVIL,MOTRIN) 800 MG tablet Take 1 tablet (800 mg total) by mouth every 8 (eight) hours as needed. 03/27/18   Romualdo Bolk, MD  Multiple Vitamin (MULTIVITAMIN) tablet Take 1 tablet by mouth daily.    [provider]  NONFORMULARY OR COMPOUNDED ITEM 0.5%Testosterone cream, apply 0.5 gram daily to the lower abdomen. Dispense 30 grams, no refills. 10/14/18    Romualdo Bolk, MD  Probiotic Product (PROBIOTIC PO) Take by mouth.    [provider]  traZODone (DESYREL) 50 MG tablet Take 1-2 tablets (50-100 mg total) by mouth at bedtime as needed for sleep. 07/02/18   Merlyn Albert, MD    Allergies    Effexor [venlafaxine hcl], Sulfa antibiotics, and Zoloft [sertraline hcl]  Review of Systems   Review of Systems  Gastrointestinal: Positive for abdominal pain, constipation, nausea and vomiting.  All other systems reviewed and are negative.   Physical Exam Updated Vital Signs BP 140/89   Pulse 86   Temp 98.2 F (36.8 C) (Oral)   Resp 17   Ht 5\' 3"  (1.6 m)   Wt 63.5 kg   LMP 01/03/2000   SpO2 98%   BMI 24.80 kg/m   Physical Exam Vitals and nursing note reviewed.  Constitutional:      General: She is not in acute distress.    Appearance: Normal appearance. She is well-developed.  HENT:     Head: Normocephalic and atraumatic.     Right Ear: Hearing normal.     Left Ear: Hearing normal.     Nose: Nose normal.  Eyes:     Conjunctiva/sclera: Conjunctivae normal.     Pupils: Pupils are equal, round, and reactive to light.  Cardiovascular:     Rate and Rhythm: Regular rhythm.     Heart sounds: S1 normal and S2 normal. No murmur. No friction rub. No gallop.   Pulmonary:     Effort: Pulmonary effort is normal. No respiratory distress.     Breath sounds: Normal breath sounds.  Chest:     Chest wall: No tenderness.  Abdominal:     General: Bowel sounds are normal.     Palpations: Abdomen is soft.     Tenderness: There is generalized abdominal tenderness. There is no guarding or rebound. Negative signs include Murphy's sign and McBurney's sign.     Hernia: No hernia is present.  Musculoskeletal:        General: Normal range of motion.     Cervical back: Normal range of motion and neck supple.  Skin:    General: Skin is warm and dry.     Findings: No rash.  Neurological:     Mental Status: She is alert and  oriented to person,  place, and time.     GCS: GCS eye subscore is 4. GCS verbal subscore is 5. GCS motor subscore is 6.     Cranial Nerves: No cranial nerve deficit.     Sensory: No sensory deficit.     Coordination: Coordination normal.  Psychiatric:        Speech: Speech normal.        Behavior: Behavior normal.        Thought Content: Thought content normal.     ED Results / Procedures / Treatments   Labs (all labs ordered are listed, but only abnormal results are displayed) Labs Reviewed  COMPREHENSIVE METABOLIC PANEL - Abnormal; Notable for the following components:      Result Value   Glucose, Bld 137 (*)    Total Bilirubin 1.4 (*)    All other components within normal limits  CBC - Abnormal; Notable for the following components:   WBC 11.3 (*)    All other components within normal limits  C DIFFICILE QUICK SCREEN W PCR REFLEX  LIPASE, BLOOD  URINALYSIS, ROUTINE W REFLEX MICROSCOPIC    EKG None  Radiology CT ABDOMEN PELVIS W CONTRAST  Result Date: 12/12/2018 CLINICAL DATA:  Nausea and vomiting with abdominal pain since yesterday afternoon EXAM: CT ABDOMEN AND PELVIS WITH CONTRAST TECHNIQUE: Multidetector CT imaging of the abdomen and pelvis was performed using the standard protocol following bolus administration of intravenous contrast. CONTRAST:  OMNIPAQUE IOHEXOL 300 MG/ML  SOLN COMPARISON:  05/22/2018 FINDINGS: Lower chest:  No contributory findings. Hepatobiliary: No focal liver abnormality.Cholecystectomy with normal common bile duct diameter Pancreas: Unremarkable. Spleen: Unremarkable. Adrenals/Urinary Tract: Negative adrenals. No hydronephrosis or stone. Unremarkable bladder. Stomach/Bowel: Inflammatory appearing bowel wall thickening at the distal sigmoid and rectum with prominent Vasa recta. More proximally is liquid stool without the colonic wall thickening. No terminal ileitis or bowel obstruction. No appendicitis Vascular/Lymphatic: No acute vascular  abnormality. No mass or adenopathy. Reproductive:Hysterectomy. Small right ovarian cysts/remnant follicles. Other: No ascites or pneumoperitoneum. Musculoskeletal: No acute abnormalities. L5-S1 PLIF with solid arthrodesis IMPRESSION: Proctitis and distal sigmoid colitis. Findings are nonspecific but the same pattern was seen by CT May 2020, question ulcerative colitis. There is history of constipation but primarily liquid stool is seen throughout the colon. Electronically Signed   By: Marnee Spring M.D.   On: 12/12/2018 05:40    Procedures Procedures (including critical care time)  Medications Ordered in ED Medications  sodium chloride flush (NS) 0.9 % injection 3 mL (has no administration in time range)  iohexol (OMNIPAQUE) 300 MG/ML solution 100 mL (100 mLs Intravenous Contrast Given 12/12/18 0515)    ED Course  I have reviewed the triage vital signs and the nursing notes.  Pertinent labs & imaging results that were available during my care of the patient were reviewed by me and considered in my medical decision making (see chart for details).    MDM Rules/Calculators/A&P     CHA2DS2/VAS Stroke Risk Points      N/A >= 2 Points: High Risk  1 - 1.99 Points: Medium Risk  0 Points: Low Risk    A final score could not be computed because of missing components.: Last  Change: N/A     This score determines the patient's risk of having a stroke if the  patient has atrial fibrillation.      This score is not applicable to this patient. Components are not  calculated.  Patient presents for constipation with abdominal pain, nausea, vomiting.  She did take multiple medications at home prior to arrival.  While here in the ER the medications did start to take some effect and she has had a bowel movement with some improvement of her symptoms.  Lab work unremarkable.  CT abdomen and pelvis has been performed.  There is evidence of colitis and proctitis.  This was seen previously  in May.  When I go back to look at that presentation, her colitis was secondary to C. difficile.  It is therefore imperative to determine if she has recurrent C. difficile or if this is an infectious colitis.  Stool has been collected and C. difficile antigen is running.  Will sign out to oncoming ER physician.  If C. difficile positive, treat with vancomycin oral.  If negative, consider Cipro and Flagyl, prompt follow-up with GI.  Final Clinical Impression(s) / ED Diagnoses Final diagnoses:  Colitis    Rx / DC Orders ED Discharge Orders    None       Orpah Greek, MD 12/12/18 [phone]

## 2018-12-13 ENCOUNTER — Other Ambulatory Visit: Payer: Self-pay

## 2018-12-13 ENCOUNTER — Encounter: Payer: Self-pay | Admitting: Obstetrics and Gynecology

## 2018-12-13 ENCOUNTER — Ambulatory Visit (INDEPENDENT_AMBULATORY_CARE_PROVIDER_SITE_OTHER): Payer: BC Managed Care – PPO | Admitting: Family Medicine

## 2018-12-13 ENCOUNTER — Ambulatory Visit (INDEPENDENT_AMBULATORY_CARE_PROVIDER_SITE_OTHER): Payer: BC Managed Care – PPO | Admitting: Obstetrics and Gynecology

## 2018-12-13 VITALS — BP 128/90 | HR 84 | Temp 97.2°F | Ht 62.5 in | Wt 140.0 lb

## 2018-12-13 DIAGNOSIS — N952 Postmenopausal atrophic vaginitis: Secondary | ICD-10-CM

## 2018-12-13 DIAGNOSIS — Z01419 Encounter for gynecological examination (general) (routine) without abnormal findings: Secondary | ICD-10-CM | POA: Diagnosis not present

## 2018-12-13 DIAGNOSIS — Z0000 Encounter for general adult medical examination without abnormal findings: Secondary | ICD-10-CM

## 2018-12-13 DIAGNOSIS — Z833 Family history of diabetes mellitus: Secondary | ICD-10-CM

## 2018-12-13 DIAGNOSIS — R6882 Decreased libido: Secondary | ICD-10-CM | POA: Diagnosis not present

## 2018-12-13 DIAGNOSIS — E559 Vitamin D deficiency, unspecified: Secondary | ICD-10-CM | POA: Diagnosis not present

## 2018-12-13 DIAGNOSIS — K529 Noninfective gastroenteritis and colitis, unspecified: Secondary | ICD-10-CM

## 2018-12-13 MED ORDER — ESTRADIOL 10 MCG VA TABS: 1.0000 | ORAL_TABLET | VAGINAL | 3 refills | Status: DC

## 2018-12-13 NOTE — Progress Notes (Signed)
   Subjective:  Audio only  Patient ID: Angel Silva, female    DOB: [birth year], 53 y.o.   MRN: [id number]  HPI Pt went to [facility] on Wednesday night for pressure in stomach and at rear end. Pt also vomited. Pt had spoke with Hoyle Sauer 12/07/2018 and was severely constipated. Pt states she feels better today. Pt did get diagnosed with colitis. Pt was placed on Augmentin.   Virtual Visit via Telephone Note  I connected with Angel Silva on 12/13/18 at  9:00 AM EST by telephone and verified that I am speaking with the correct person using two identifiers.  Location: Patient: home Provider: office   I discussed the limitations, risks, security and privacy concerns of performing an evaluation and management service by telephone and the availability of in person appointments. I also discussed with the patient that there may be a patient responsible charge related to this service. The patient expressed understanding and agreed to proceed.   History of Present Illness:    Observations/Objective:   Assessment and Plan:   Follow Up Instructions:    I discussed the assessment and treatment plan with the patient. The patient was provided an opportunity to ask questions and all were answered. The patient agreed with the plan and demonstrated an understanding of the instructions.   The patient was advised to call back or seek an in-person evaluation if the symptoms worsen or if the condition fails to improve as anticipated.  I provided 18 minutes of non-face-to-face time during this encounter.   Vicente Males, LPN Emergency room report reviewed scans test results reviewed  Skin did reveal an elements of thickening of the distal colon.  Of note patient had colonoscopy earlier this year with no noticeable mucosal troubles.  History of pancreatitis in the past.  Fairly severe.  Lipase normal and pancreas apparently normal with this scan   Review of Systems No headache no chest pain  no shortness of breath    Objective:   Physical Exam   Virtual     Assessment & Plan:  Impression flare of colitis.  This is a second 1 this year.  Patient due to see her gastroenterologist next week which I think is a good idea.  Handling antibiotics well.  To maintain.  Warning signs discussed

## 2018-12-13 NOTE — Patient Instructions (Signed)
EXERCISE AND DIET:  We recommended that you start or continue a regular exercise program for good health. Regular exercise means any activity that makes your heart beat faster and makes you sweat.  We recommend exercising at least 30 minutes per day at least 3 days a week, preferably 4 or 5.  We also recommend a diet low in fat and sugar.  Inactivity, poor dietary choices and obesity can cause diabetes, heart attack, stroke, and kidney damage, among others.    ALCOHOL AND SMOKING:  Women should limit their alcohol intake to no more than 7 drinks/beers/glasses of wine (combined, not each!) per week. Moderation of alcohol intake to this level decreases your risk of breast cancer and liver damage. And of course, no recreational drugs are part of a healthy lifestyle.  And absolutely no smoking or even second hand smoke. Most people know smoking can cause heart and lung diseases, but did you know it also contributes to weakening of your bones? Aging of your skin?  Yellowing of your teeth and nails?  CALCIUM AND VITAMIN D:  Adequate intake of calcium and Vitamin D are recommended.  The recommendations for exact amounts of these supplements seem to change often, but generally speaking 1,200 mg of calcium (between diet and supplement) and 800 units of Vitamin D per day seems prudent. Certain women may benefit from higher intake of Vitamin D.  If you are among these women, your doctor will have told you during your visit.    PAP SMEARS:  Pap smears, to check for cervical cancer or precancers,  have traditionally been done yearly, although recent scientific advances have shown that most women can have pap smears less often.  However, every woman still should have a physical exam from her gynecologist every year. It will include a breast check, inspection of the vulva and vagina to check for abnormal growths or skin changes, a visual exam of the cervix, and then an exam to evaluate the size and shape of the uterus and  ovaries.  And after 52 years of age, a rectal exam is indicated to check for rectal cancers. We will also provide age appropriate advice regarding health maintenance, like when you should have certain vaccines, screening for sexually transmitted diseases, bone density testing, colonoscopy, mammograms, etc.   MAMMOGRAMS:  All women over 40 years old should have a yearly mammogram. Many facilities now offer a "3D" mammogram, which may cost around $50 extra out of pocket. If possible,  we recommend you accept the option to have the 3D mammogram performed.  It both reduces the number of women who will be called back for extra views which then turn out to be normal, and it is better than the routine mammogram at detecting truly abnormal areas.    COLON CANCER SCREENING: Now recommend starting at age 45. At this time colonoscopy is not covered for routine screening until 50. There are take home tests that can be done between 45-49.   COLONOSCOPY:  Colonoscopy to screen for colon cancer is recommended for all women at age 50.  We know, you hate the idea of the prep.  We agree, BUT, having colon cancer and not knowing it is worse!!  Colon cancer so often starts as a polyp that can be seen and removed at colonscopy, which can quite literally save your life!  And if your first colonoscopy is normal and you have no family history of colon cancer, most women don't have to have it again for   10 years.  Once every ten years, you can do something that may end up saving your life, right?  We will be happy to help you get it scheduled when you are ready.  Be sure to check your insurance coverage so you understand how much it will cost.  It may be covered as a preventative service at no cost, but you should check your particular policy.      Breast Self-Awareness Breast self-awareness means being familiar with how your breasts look and feel. It involves checking your breasts regularly and reporting any changes to your  health care provider. Practicing breast self-awareness is important. A change in your breasts can be a sign of a serious medical problem. Being familiar with how your breasts look and feel allows you to find any problems early, when treatment is more likely to be successful. All women should practice breast self-awareness, including women who have had breast implants. How to do a breast self-exam One way to learn what is normal for your breasts and whether your breasts are changing is to do a breast self-exam. To do a breast self-exam: Look for Changes  1. Remove all the clothing above your waist. 2. Stand in front of a mirror in a room with good lighting. 3. Put your hands on your hips. 4. Push your hands firmly downward. 5. Compare your breasts in the mirror. Look for differences between them (asymmetry), such as: ? Differences in shape. ? Differences in size. ? Puckers, dips, and bumps in one breast and not the other. 6. Look at each breast for changes in your skin, such as: ? Redness. ? Scaly areas. 7. Look for changes in your nipples, such as: ? Discharge. ? Bleeding. ? Dimpling. ? Redness. ? A change in position. Feel for Changes Carefully feel your breasts for lumps and changes. It is best to do this while lying on your back on the floor and again while sitting or standing in the shower or tub with soapy water on your skin. Feel each breast in the following way:  Place the arm on the side of the breast you are examining above your head.  Feel your breast with the other hand.  Start in the nipple area and make  inch (2 cm) overlapping circles to feel your breast. Use the pads of your three middle fingers to do this. Apply light pressure, then medium pressure, then firm pressure. The light pressure will allow you to feel the tissue closest to the skin. The medium pressure will allow you to feel the tissue that is a little deeper. The firm pressure will allow you to feel the tissue  close to the ribs.  Continue the overlapping circles, moving downward over the breast until you feel your ribs below your breast.  Move one finger-width toward the center of the body. Continue to use the  inch (2 cm) overlapping circles to feel your breast as you move slowly up toward your collarbone.  Continue the up and down exam using all three pressures until you reach your armpit.  Write Down What You Find  Write down what is normal for each breast and any changes that you find. Keep a written record with breast changes or normal findings for each breast. By writing this information down, you do not need to depend only on memory for size, tenderness, or location. Write down where you are in your menstrual cycle, if you are still menstruating. If you are having trouble noticing differences   in your breasts, do not get discouraged. With time you will become more familiar with the variations in your breasts and more comfortable with the exam. How often should I examine my breasts? Examine your breasts every month. If you are breastfeeding, the best time to examine your breasts is after a feeding or after using a breast pump. If you menstruate, the best time to examine your breasts is 5-7 days after your period is over. During your period, your breasts are lumpier, and it may be more difficult to notice changes. When should I see my health care provider? See your health care provider if you notice:  A change in shape or size of your breasts or nipples.  A change in the skin of your breast or nipples, such as a reddened or scaly area.  Unusual discharge from your nipples.  A lump or thick area that was not there before.  Pain in your breasts.  Anything that concerns you.  

## 2018-12-15 ENCOUNTER — Encounter: Payer: Self-pay | Admitting: Family Medicine

## 2018-12-17 NOTE — Progress Notes (Signed)
GYNECOLOGY  VISIT   HPI: 53 y.o.   Divorced White or Caucasian Not Hispanic or Latino  female   [phone] with Patient's last menstrual period was 01/03/2000.   here for 1 week recheck of left adnexal fullness noted on pelvic exam on 12/13/2018. Since her visit last week she has seen GI and was told that she didn't have ulcerative colitis. She was started on medication for constipation and it is already helping.  When she was evaluated for abdominal pain on 12/12/18, she was noted to have "small right ovarian cysts/remnant follicles", no measurements were given.   GYNECOLOGIC HISTORY: Patient's last menstrual period was 01/03/2000. Contraception: Hysterectomy  Menopausal hormone therapy: None        OB History    Gravida  5   Para  3   Term  1   Preterm  2   AB  2   Living  3     SAB      TAB  1   Ectopic      Multiple      Live Births  3              Patient Active Problem List   Diagnosis Date Noted  . Chronic constipation 12/06/2018  . Status post arthroscopy of left shoulder 08/27/2018  . Rotator cuff tendonitis, left 01/25/2017  . Generalized anxiety disorder 12/29/2014  . Chronic left shoulder pain 07/11/2013  . Insomnia 04/11/2013  . Atypical chest pain 01/17/2013  . Dyspnea 01/03/2013  . Esophageal reflux 05/30/2012  . Depression 05/30/2012  . Chronic cough 05/30/2012    Past Medical History:  Diagnosis Date  . Abnormal Pap smear of cervix    over 30 yrs ago  . Anxiety   . Chronic joint pain   . Depression   . IBS (irritable bowel syndrome)   . Reflux     Past Surgical History:  Procedure Laterality Date  . ABDOMINAL HYSTERECTOMY  2002   TAH- Fibroids , endometriosis; ov retained  . BACK SURGERY  2005   L5-S1 Fusion  . CESAREAN SECTION  1987  . ESOPHAGOGASTRODUODENOSCOPY    . LAPAROSCOPIC CHOLECYSTECTOMY  1995  . MASTOPEXY  2012  . REDUCTION MAMMAPLASTY Bilateral   . SHOULDER SURGERY    . TONSILLECTOMY    . TUBAL LIGATION  1993    BTSP    Current Outpatient Medications  Medication Sig Dispense Refill  . albuterol (PROVENTIL HFA;VENTOLIN HFA) 108 (90 Base) MCG/ACT inhaler Inhale 2 puffs into the lungs every 6 (six) hours as needed for wheezing or shortness of breath. 1 Inhaler 2  . amoxicillin-clavulanate (AUGMENTIN) 875-125 MG tablet Take 1 tablet by mouth 2 (two) times daily. One po bid x 7 days 14 tablet 0  . Boswellia-Glucosamine-Vit D (OSTEO BI-FLEX ONE PER DAY PO) Take by mouth.    . clonazePAM (KLONOPIN) 1 MG tablet Take 1 tablet (1 mg total) by mouth daily as needed. 30 tablet 5  . Estradiol 10 MCG TABS vaginal tablet Place 1 tablet (10 mcg total) vaginally 2 (two) times a week. 24 tablet 3  . ibuprofen (ADVIL,MOTRIN) 800 MG tablet Take 1 tablet (800 mg total) by mouth every 8 (eight) hours as needed. 30 tablet 1  . Multiple Vitamin (MULTIVITAMIN) tablet Take 1 tablet by mouth daily.    Marland Kitchen Plecanatide (TRULANCE PO) Take by mouth.    . Probiotic Product (PROBIOTIC PO) Take by mouth.    . traZODone (DESYREL) 50 MG tablet Take 1-2 tablets (  50-100 mg total) by mouth at bedtime as needed for sleep. 45 tablet 5  . NONFORMULARY OR COMPOUNDED ITEM 0.5%Testosterone cream, apply 0.5 gram daily to the lower abdomen. Dispense 30 grams, no refills. (Patient not taking: Reported on 12/18/2018) 1 each 0   No current facility-administered medications for this visit.     ALLERGIES: Effexor [venlafaxine hcl], Sulfa antibiotics, and Zoloft [sertraline hcl]  Family History  Problem Relation Age of Onset  . Hypertension Mother   . Diabetes Mother        Type 2  . Thyroid disease Mother   . Glaucoma Mother   . Hypertension Father   . Atrial fibrillation Father   . Cancer Father        bladder & liver  . Heart disease Father   . Crohn's disease Father   . Heart attack Maternal Grandfather   . Diabetes Maternal Grandmother        Type 2  . Hypertension Maternal Grandmother   . Heart disease Maternal Grandmother   .  Heart disease Paternal Grandmother   . CAD Maternal Uncle   . Hypertension Brother   . Melanoma Brother   . Heart murmur Brother   . Other Brother        leaky aortic valve  . Breast cancer Neg Hx     Social History   Socioeconomic History  . Marital status: Divorced    Spouse name: Not on file  . Number of children: Not on file  . Years of education: Not on file  . Highest education level: Not on file  Occupational History  . Not on file  Tobacco Use  . Smoking status: Former Research scientist (life sciences)  . Smokeless tobacco: Never Used  Substance and Sexual Activity  . Alcohol use: Yes    Alcohol/week: 0.0 - 3.0 standard drinks  . Drug use: No  . Sexual activity: Yes    Partners: Male    Birth control/protection: Surgical    Comment: TAH  Other Topics Concern  . Not on file  Social History Narrative  . Not on file   Social Determinants of Health   Financial Resource Strain:   . Difficulty of Paying Living Expenses: Not on file  Food Insecurity:   . Worried About Charity fundraiser in the Last Year: Not on file  . Ran Out of Food in the Last Year: Not on file  Transportation Needs:   . Lack of Transportation (Medical): Not on file  . Lack of Transportation (Non-Medical): Not on file  Physical Activity:   . Days of Exercise per Week: Not on file  . Minutes of Exercise per Session: Not on file  Stress:   . Feeling of Stress : Not on file  Social Connections:   . Frequency of Communication with Friends and Family: Not on file  . Frequency of Social Gatherings with Friends and Family: Not on file  . Attends Religious Services: Not on file  . Active Member of Clubs or Organizations: Not on file  . Attends Archivist Meetings: Not on file  . Marital Status: Not on file  Intimate Partner Violence:   . Fear of Current or Ex-Partner: Not on file  . Emotionally Abused: Not on file  . Physically Abused: Not on file  . Sexually Abused: Not on file    Review of Systems   Constitutional: Negative.   HENT: Negative.   Eyes: Negative.   Respiratory: Negative.   Cardiovascular: Negative.  Gastrointestinal: Negative.   Genitourinary: Negative.   Musculoskeletal: Negative.   Skin: Negative.   Neurological: Negative.   Endo/Heme/Allergies: Negative.   Psychiatric/Behavioral: Negative.     PHYSICAL EXAMINATION:    BP 134/86 (BP Location: Right Arm, Patient Position: Sitting, Cuff Size: Normal)   Pulse 68   Temp 97.6 F (36.4 C) (Skin)   Wt 142 lb (64.4 kg)   LMP 01/03/2000   BMI 25.56 kg/m     General appearance: alert, cooperative and appears stated age  Pelvic: External genitalia:  no lesions              Urethra:  normal appearing urethra with no masses, tenderness or lesions              Bartholins and Skenes: normal                 Cervix: absent              Bimanual Exam:  Uterus:  uterus absent              Adnexa: no mass, fullness, tenderness              Rectovaginal: Yes.  .  Confirms.              Anus:  normal sphincter tone, no lesions  Chaperone was present for exam.  ASSESSMENT Left adnexal fullness noted at her annual exam last week, gone today, c/w stool "Small" right ovarian cyst on CT last week    PLAN Adnexal fullness has resolved, no follow up needed I will ask Radiology to measure the right ovarian cyst if over 1 cm [facility]. If her lab work is in the menopausal range and the cyst is over 1 cm, will set her up for a pelvic ultrasound   An After Visit Summary was printed and given to the patient.

## 2018-12-18 ENCOUNTER — Encounter: Payer: Self-pay | Admitting: Obstetrics and Gynecology

## 2018-12-18 ENCOUNTER — Other Ambulatory Visit: Payer: Self-pay

## 2018-12-18 ENCOUNTER — Ambulatory Visit: Payer: BC Managed Care – PPO | Admitting: Obstetrics and Gynecology

## 2018-12-18 VITALS — BP 134/86 | HR 68 | Temp 97.6°F | Wt 142.0 lb

## 2018-12-18 DIAGNOSIS — Z09 Encounter for follow-up examination after completed treatment for conditions other than malignant neoplasm: Secondary | ICD-10-CM | POA: Diagnosis not present

## 2018-12-18 DIAGNOSIS — N83201 Unspecified ovarian cyst, right side: Secondary | ICD-10-CM

## 2018-12-31 ENCOUNTER — Other Ambulatory Visit: Payer: Self-pay | Admitting: Family Medicine

## 2018-12-31 NOTE — Telephone Encounter (Signed)
Six mo 

## 2019-01-03 ENCOUNTER — Encounter: Payer: Self-pay | Admitting: Obstetrics and Gynecology

## 2019-01-06 ENCOUNTER — Telehealth: Payer: Self-pay | Admitting: Obstetrics and Gynecology

## 2019-01-06 NOTE — Telephone Encounter (Signed)
Call placed to patient, no answer, mailbox full, unable to leave voicemail.   MyChart message to patient.

## 2019-01-06 NOTE — Telephone Encounter (Signed)
Patient sent the following correspondence through MyChart.  Happy New Year!  Unfortunately I'm having symptoms of a UTI. Could you possibly call in a prescription to [facility] in [place]?

## 2019-01-06 NOTE — Telephone Encounter (Signed)
Attempted to call patient a second time, no answer, mailbox full.

## 2019-01-06 NOTE — Telephone Encounter (Signed)
Call reviewed with Dr. Oscar La, call returned to patient. No answer, unable to leave voicemail. No alternative number.   Needs OV, can schedule with Dr. Oscar La today at 11:15am.

## 2019-01-06 NOTE — Telephone Encounter (Signed)
Will you please try the patient one more time. If you can't reach her then send a letter telling her that we've tried to reach her but her mailbox has been full.

## 2019-01-06 NOTE — Telephone Encounter (Signed)
Spoke with patient. Reports Hx of UTIs, has Rx for Macrobid 50 mg to take PRN with intercourse for UTI prevention. Reports symptoms of urgency, lower back pain and dysuria on 12/31, she was out of town, started AZO and "doubled up antibiotic" on 1/2. Has not taken abx or AZO since 1/2, symptoms improved, not resolved. Reports vagina just feels "weird and off" and vaginal odor. Patient is requesting additional Rx to pharmacy on file to completed Macrobid.   Denies vag d/c, urgency, frequency, dysuria, flank pain, fever/chills today.    Advised patient OV likely needed for further evaluation, will review with Dr. Oscar La and return call. Patient agreeable.   Dr. Oscar La -please advise.

## 2019-01-06 NOTE — Telephone Encounter (Signed)
MyChart message Last read by [name] at 3:25 PM on 01/06/2019.

## 2019-01-07 ENCOUNTER — Other Ambulatory Visit: Payer: Self-pay

## 2019-01-07 NOTE — Telephone Encounter (Signed)
Patient returned call to [place]. Scheduled OV for 3:30 PM 01/07/2019.

## 2019-01-08 ENCOUNTER — Encounter: Payer: Self-pay | Admitting: Obstetrics and Gynecology

## 2019-01-08 ENCOUNTER — Ambulatory Visit: Payer: BC Managed Care – PPO | Admitting: Obstetrics and Gynecology

## 2019-01-08 VITALS — BP 130/74 | HR 72 | Temp 98.2°F | Resp 14 | Ht 63.0 in | Wt 144.2 lb

## 2019-01-08 DIAGNOSIS — B9689 Other specified bacterial agents as the cause of diseases classified elsewhere: Secondary | ICD-10-CM | POA: Diagnosis not present

## 2019-01-08 DIAGNOSIS — N83201 Unspecified ovarian cyst, right side: Secondary | ICD-10-CM

## 2019-01-08 DIAGNOSIS — N760 Acute vaginitis: Secondary | ICD-10-CM

## 2019-01-08 DIAGNOSIS — N83202 Unspecified ovarian cyst, left side: Secondary | ICD-10-CM

## 2019-01-08 DIAGNOSIS — R350 Frequency of micturition: Secondary | ICD-10-CM | POA: Diagnosis not present

## 2019-01-08 LAB — POCT URINALYSIS DIPSTICK
Bilirubin, UA: NEGATIVE
Blood, UA: NEGATIVE
Glucose, UA: NEGATIVE
Ketones, UA: NEGATIVE
Leukocytes, UA: NEGATIVE
Nitrite, UA: NEGATIVE
Protein, UA: NEGATIVE
Urobilinogen, UA: 0.2 E.U./dL
pH, UA: 5 (ref 5.0–8.0)

## 2019-01-08 MED ORDER — METRONIDAZOLE 500 MG PO TABS: 500.0000 mg | ORAL_TABLET | Freq: Two times a day (BID) | ORAL | 0 refills | Status: DC

## 2019-01-08 NOTE — Progress Notes (Signed)
GYNECOLOGY  VISIT   HPI: 53 y.o.   Divorced White or Caucasian Not Hispanic or Latino  female   [phone] with Patient's last menstrual period was 01/03/2000.   here for   uti symptoms. Complaining of urinary frequency, occasional itching and vaginal burning. Took nitrofurantoin on Saturday.   Her urinary symptoms started last Thursday, frequency, urgency and mild dysuria. She took 100 mg of macrobid BID x 3 days. She feels better, still with some frequency, normal amounts.  She c/o a 2 days of vulvovaginal burning, itching and some mild yellow vaginal discharge.   She was diagnosed with small bilateral ovarian cysts on CT last month. She is getting lab work in a few weeks, plan was to check an [facility] to decide if she should have an ultrasound.   GYNECOLOGIC HISTORY: Patient's last menstrual period was 01/03/2000. Contraception: hysterectomy  Menopausal hormone therapy: n/a        OB History    Gravida  5   Para  3   Term  1   Preterm  2   AB  2   Living  3     SAB      TAB  1   Ectopic      Multiple      Live Births  3              Patient Active Problem List   Diagnosis Date Noted  . Chronic constipation 12/06/2018  . Status post arthroscopy of left shoulder 08/27/2018  . Rotator cuff tendonitis, left 01/25/2017  . Generalized anxiety disorder 12/29/2014  . Chronic left shoulder pain 07/11/2013  . Insomnia 04/11/2013  . Atypical chest pain 01/17/2013  . Dyspnea 01/03/2013  . Esophageal reflux 05/30/2012  . Depression 05/30/2012  . Chronic cough 05/30/2012    Past Medical History:  Diagnosis Date  . Abnormal Pap smear of cervix    over 30 yrs ago  . Anxiety   . Chronic joint pain   . Depression   . IBS (irritable bowel syndrome)   . Reflux     Past Surgical History:  Procedure Laterality Date  . ABDOMINAL HYSTERECTOMY  2002   TAH- Fibroids , endometriosis; ov retained  . BACK SURGERY  2005   L5-S1 Fusion  . CESAREAN SECTION  1987  .  ESOPHAGOGASTRODUODENOSCOPY    . LAPAROSCOPIC CHOLECYSTECTOMY  1995  . MASTOPEXY  2012  . REDUCTION MAMMAPLASTY Bilateral   . SHOULDER SURGERY    . TONSILLECTOMY    . TUBAL LIGATION  1993   BTSP    Current Outpatient Medications  Medication Sig Dispense Refill  . albuterol (PROVENTIL HFA;VENTOLIN HFA) 108 (90 Base) MCG/ACT inhaler Inhale 2 puffs into the lungs every 6 (six) hours as needed for wheezing or shortness of breath. 1 Inhaler 2  . amoxicillin-clavulanate (AUGMENTIN) 875-125 MG tablet Take 1 tablet by mouth 2 (two) times daily. One po bid x 7 days 14 tablet 0  . Boswellia-Glucosamine-Vit D (OSTEO BI-FLEX ONE PER DAY PO) Take by mouth.    . clonazePAM (KLONOPIN) 1 MG tablet TAKE 1 TABLET BY MOUTH ONCE DAILY AS NEEDED 30 tablet 5  . Estradiol 10 MCG TABS vaginal tablet Place 1 tablet (10 mcg total) vaginally 2 (two) times a week. 24 tablet 3  . ibuprofen (ADVIL,MOTRIN) 800 MG tablet Take 1 tablet (800 mg total) by mouth every 8 (eight) hours as needed. 30 tablet 1  . Multiple Vitamin (MULTIVITAMIN) tablet Take 1 tablet by mouth daily.    Marland Kitchen  NONFORMULARY OR COMPOUNDED ITEM 0.5%Testosterone cream, apply 0.5 gram daily to the lower abdomen. Dispense 30 grams, no refills. (Patient not taking: Reported on 12/18/2018) 1 each 0  . Plecanatide (TRULANCE PO) Take by mouth.    . Probiotic Product (PROBIOTIC PO) Take by mouth.    . traZODone (DESYREL) 50 MG tablet Take 1-2 tablets (50-100 mg total) by mouth at bedtime as needed for sleep. 45 tablet 5   No current facility-administered medications for this visit.     ALLERGIES: Effexor [venlafaxine hcl], Sulfa antibiotics, and Zoloft [sertraline hcl]  Family History  Problem Relation Age of Onset  . Hypertension Mother   . Diabetes Mother        Type 2  . Thyroid disease Mother   . Glaucoma Mother   . Hypertension Father   . Atrial fibrillation Father   . Cancer Father        bladder & liver  . Heart disease Father   . Crohn's  disease Father   . Heart attack Maternal Grandfather   . Diabetes Maternal Grandmother        Type 2  . Hypertension Maternal Grandmother   . Heart disease Maternal Grandmother   . Heart disease Paternal Grandmother   . CAD Maternal Uncle   . Hypertension Brother   . Melanoma Brother   . Heart murmur Brother   . Other Brother        leaky aortic valve  . Breast cancer Neg Hx     Social History   Socioeconomic History  . Marital status: Divorced    Spouse name: Not on file  . Number of children: Not on file  . Years of education: Not on file  . Highest education level: Not on file  Occupational History  . Not on file  Tobacco Use  . Smoking status: Former Research scientist (life sciences)  . Smokeless tobacco: Never Used  Substance and Sexual Activity  . Alcohol use: Yes    Alcohol/week: 0.0 - 3.0 standard drinks  . Drug use: No  . Sexual activity: Yes    Partners: Male    Birth control/protection: Surgical    Comment: TAH  Other Topics Concern  . Not on file  Social History Narrative  . Not on file   Social Determinants of Health   Financial Resource Strain:   . Difficulty of Paying Living Expenses: Not on file  Food Insecurity:   . Worried About Charity fundraiser in the Last Year: Not on file  . Ran Out of Food in the Last Year: Not on file  Transportation Needs:   . Lack of Transportation (Medical): Not on file  . Lack of Transportation (Non-Medical): Not on file  Physical Activity:   . Days of Exercise per Week: Not on file  . Minutes of Exercise per Session: Not on file  Stress:   . Feeling of Stress : Not on file  Social Connections:   . Frequency of Communication with Friends and Family: Not on file  . Frequency of Social Gatherings with Friends and Family: Not on file  . Attends Religious Services: Not on file  . Active Member of Clubs or Organizations: Not on file  . Attends Archivist Meetings: Not on file  . Marital Status: Not on file  Intimate Partner  Violence:   . Fear of Current or Ex-Partner: Not on file  . Emotionally Abused: Not on file  . Physically Abused: Not on file  . Sexually Abused: Not  on file    Review of Systems  Genitourinary: Positive for frequency.       Occasional itching Vaginal burning   All other systems reviewed and are negative.   PHYSICAL EXAMINATION:    LMP 01/03/2000     General appearance: alert, cooperative and appears stated age CVA: not tender Abdomen: soft, non-tender; non distended, no masses,  no organomegaly  Pelvic: External genitalia:  no lesions, mild erythema              Urethra:  normal appearing urethra with no masses, tenderness or lesions              Bartholins and Skenes: normal                 Vagina: normal appearing vagina with normal color and discharge, no lesions              Cervix:absent  Chaperone was present for exam.  Urine dip: negative  Wet prep: + clue, no trich, few wbc KOH: no yeast PH: 5   ASSESSMENT Symptoms of UTI essentially resolved with 3 days of macrobid, negative urine dip. Still with some frequency Vulvovaginitis, BV on slides, pruritis concerning for yeast    PLAN Send urine for ua, c&s Call with recurrent UTI symptoms, could be partially treated Treat BV Send nuswab for yeast    An After Visit Summary was printed and given to the patient.

## 2019-01-08 NOTE — Patient Instructions (Signed)
Vaginitis Vaginitis is a condition in which the vaginal tissue swells and becomes red (inflamed). This condition is most often caused by a change in the normal balance of bacteria and yeast that live in the vagina. This change causes an overgrowth of certain bacteria or yeast, which causes the inflammation. There are different types of vaginitis, but the most common types are:  Bacterial vaginosis.  Yeast infection (candidiasis).  Trichomoniasis vaginitis. This is a sexually transmitted disease (STD).  Viral vaginitis.  Atrophic vaginitis.  Allergic vaginitis. What are the causes? The cause of this condition depends on the type of vaginitis. It can be caused by:  Bacteria (bacterial vaginosis).  Yeast, which is a fungus (yeast infection).  A parasite (trichomoniasis vaginitis).  A virus (viral vaginitis).  Low hormone levels (atrophic vaginitis). Low hormone levels can occur during pregnancy, breastfeeding, or after menopause.  Irritants, such as bubble baths, scented tampons, and feminine sprays (allergic vaginitis). Other factors can change the normal balance of the yeast and bacteria that live in the vagina. These include:  Antibiotic medicines.  Poor hygiene.  Diaphragms, vaginal sponges, spermicides, birth control pills, and intrauterine devices (IUD).  Sex.  Infection.  Uncontrolled diabetes.  A weakened defense (immune) system. What increases the risk? This condition is more likely to develop in women who:  Smoke.  Use vaginal douches, scented tampons, or scented sanitary pads.  Wear tight-fitting pants.  Wear thong underwear.  Use oral birth control pills or an IUD.  Have sex without a condom.  Have multiple sex partners.  Have an STD.  Frequently use the spermicide nonoxynol-9.  Eat lots of foods high in sugar.  Have uncontrolled diabetes.  Have low estrogen levels.  Have a weakened immune system from an immune disorder or medical  treatment.  Are pregnant or breastfeeding. What are the signs or symptoms? Symptoms vary depending on the cause of the vaginitis. Common symptoms include:  Abnormal vaginal discharge. ? The discharge is white, gray, or yellow with bacterial vaginosis. ? The discharge is thick, white, and cheesy with a yeast infection. ? The discharge is frothy and yellow or greenish with trichomoniasis.  A bad vaginal smell. The smell is fishy with bacterial vaginosis.  Vaginal itching, pain, or swelling.  Sex that is painful.  Pain or burning when urinating. Sometimes there are no symptoms. How is this diagnosed? This condition is diagnosed based on your symptoms and medical history. A physical exam, including a pelvic exam, will also be done. You may also have other tests, including:  Tests to determine the pH level (acidity or alkalinity) of your vagina.  A whiff test, to assess the odor that results when a sample of your vaginal discharge is mixed with a potassium hydroxide solution.  Tests of vaginal fluid. A sample will be examined under a microscope. How is this treated? Treatment varies depending on the type of vaginitis you have. Your treatment may include:  Antibiotic creams or pills to treat bacterial vaginosis and trichomoniasis.  Antifungal medicines, such as vaginal creams or suppositories, to treat a yeast infection.  Medicine to ease discomfort if you have viral vaginitis. Your sexual partner should also be treated.  Estrogen delivered in a cream, pill, suppository, or vaginal ring to treat atrophic vaginitis. If vaginal dryness occurs, lubricants and moisturizing creams may help. You may need to avoid scented soaps, sprays, or douches.  Stopping use of a product that is causing allergic vaginitis. Then using a vaginal cream to treat the symptoms. Follow   these instructions at home: Lifestyle  Keep your genital area clean and dry. Avoid soap, and only rinse the area with  water.  Do not douche or use tampons until your health care provider says it is okay to do so. Use sanitary pads, if needed.  Do not have sex until your health care provider approves. When you can return to sex, practice safe sex and use condoms.  Wipe from front to back. This avoids the spread of bacteria from the rectum to the vagina. General instructions  Take over-the-counter and prescription medicines only as told by your health care provider.  If you were prescribed an antibiotic medicine, take or use it as told by your health care provider. Do not stop taking or using the antibiotic even if you start to feel better.  Keep all follow-up visits as told by your health care provider. This is important. How is this prevented?  Use mild, non-scented products. Do not use things that can irritate the vagina, such as fabric softeners. Avoid the following products if they are scented: ? Feminine sprays. ? Detergents. ? Tampons. ? Feminine hygiene products. ? Soaps or bubble baths.  Let air reach your genital area. ? Wear cotton underwear to reduce moisture buildup. ? Avoid wearing underwear while you sleep. ? Avoid wearing tight pants and underwear or nylons without a cotton panel. ? Avoid wearing thong underwear.  Take off any wet clothing, such as bathing suits, as soon as possible.  Practice safe sex and use condoms. Contact a health care provider if:  You have abdominal pain.  You have a fever.  You have symptoms that last for more than 2-3 days. Get help right away if:  You have a fever and your symptoms suddenly get worse. Summary  Vaginitis is a condition in which the vaginal tissue becomes inflamed.This condition is most often caused by a change in the normal balance of bacteria and yeast that live in the vagina.  Treatment varies depending on the type of vaginitis you have.  Do not douche, use tampons , or have sex until your health care provider approves. When  you can return to sex, practice safe sex and use condoms. This information is not intended to replace advice given to you by your health care provider. Make sure you discuss any questions you have with your health care provider. Document Revised: 12/01/2016 Document Reviewed: 01/25/2016 Elsevier Patient Education  2020 [employer]  

## 2019-01-09 LAB — URINALYSIS, MICROSCOPIC ONLY
Bacteria, UA: NONE SEEN
Casts: NONE SEEN /lpf

## 2019-01-10 LAB — URINE CULTURE

## 2019-01-11 LAB — C ALBICANS + C GLABRATA, NAA
Candida albicans, NAA: NEGATIVE
Candida glabrata, NAA: NEGATIVE

## 2019-01-12 ENCOUNTER — Encounter: Payer: Self-pay | Admitting: Obstetrics and Gynecology

## 2019-01-13 ENCOUNTER — Telehealth: Payer: Self-pay | Admitting: Obstetrics and Gynecology

## 2019-01-13 NOTE — Telephone Encounter (Signed)
She should be seen.

## 2019-01-13 NOTE — Telephone Encounter (Signed)
Spoke with patient. Patient states she reviewed her 01/08/19 UC results per Dr. Oscar La. Patient reports dull ache left flank and left abdomen and intermittent dark cloudy urine. Symptoms are new since OV, symptoms started 2 days ago. Urine is more clear when she increases her fluids. Denies any other symptoms. Patient asking any further evaluation needed for possible kidney stones? Patient has never seen a urologist.    Advised patient I will review with Dr. Oscar La and return call with recommendations, patient agreeable.   Dr. Oscar La -please review and advise.

## 2019-01-13 NOTE — Telephone Encounter (Signed)
Call placed to patient, no answer, mailbox full, unable to leave message.   MyChart message to patient.

## 2019-01-13 NOTE — Telephone Encounter (Signed)
Patient sent the following message through MyChart. Routing to triage to assist patient with request.  Angel Silva "Angel Silva"  P [facility]  Phone Number: [phone]  [facility] Dr Oscar La,  Are there any symptoms I need to look out for as far as the calcium level in my urine?   Thank you,   Angel Silva

## 2019-01-14 ENCOUNTER — Other Ambulatory Visit (INDEPENDENT_AMBULATORY_CARE_PROVIDER_SITE_OTHER): Payer: BC Managed Care – PPO

## 2019-01-14 ENCOUNTER — Other Ambulatory Visit: Payer: Self-pay

## 2019-01-14 DIAGNOSIS — R6882 Decreased libido: Secondary | ICD-10-CM

## 2019-01-14 DIAGNOSIS — E559 Vitamin D deficiency, unspecified: Secondary | ICD-10-CM

## 2019-01-14 DIAGNOSIS — Z833 Family history of diabetes mellitus: Secondary | ICD-10-CM

## 2019-01-14 DIAGNOSIS — Z0000 Encounter for general adult medical examination without abnormal findings: Secondary | ICD-10-CM

## 2019-01-14 NOTE — Telephone Encounter (Signed)
MyChart message unread.   Call placed to patient, no answer, mailbox full, unable to leave voicemail.

## 2019-01-15 LAB — VITAMIN D 25 HYDROXY (VIT D DEFICIENCY, FRACTURES): Vit D, 25-Hydroxy: 21.8 ng/mL — ABNORMAL LOW (ref 30.0–100.0)

## 2019-01-15 LAB — LIPID PANEL
Chol/HDL Ratio: 2.8 ratio (ref 0.0–4.4)
Cholesterol, Total: 162 mg/dL (ref 100–199)
HDL: 57 mg/dL (ref >39)
LDL Chol Calc (NIH): 85 mg/dL (ref 0–99)
Triglycerides: 114 mg/dL (ref 0–149)
VLDL Cholesterol Cal: 20 mg/dL (ref 5–40)

## 2019-01-15 LAB — HEMOGLOBIN A1C
Est. average glucose Bld gHb Est-mCnc: 97 mg/dL
Hgb A1c MFr Bld: 5 % (ref 4.8–5.6)

## 2019-01-18 LAB — SPECIMEN STATUS REPORT

## 2019-01-18 LAB — FOLLICLE STIMULATING HORMONE: FSH: 57.7 m[IU]/mL

## 2019-01-20 NOTE — Telephone Encounter (Signed)
Call to patient, no answer, mailbox full, unable to leave message.  

## 2019-01-21 ENCOUNTER — Other Ambulatory Visit: Payer: Self-pay | Admitting: Obstetrics and Gynecology

## 2019-01-21 ENCOUNTER — Telehealth: Payer: Self-pay | Admitting: Obstetrics and Gynecology

## 2019-01-21 DIAGNOSIS — N951 Menopausal and female climacteric states: Secondary | ICD-10-CM

## 2019-01-21 DIAGNOSIS — N83201 Unspecified ovarian cyst, right side: Secondary | ICD-10-CM

## 2019-01-21 NOTE — Telephone Encounter (Signed)
Angel Silva L   RD  01/21/19 1:44 PM Note   Call placed to patient to convey benefits for ultrasound. Spoke with the patient and conveyed the benefits. Patient understands/agreeable with the benefits. Patient is not sure about scheduling appointment. She states she thinks it should be in 3 months. Please call patient for scheduling.      Spoke with patient. Reviewed 01/14/19 results and recommendations. PUS scheduled for 03/25/19 at 1pm, consult to follow at 1:30pm with Dr. Oscar La.   Patient reports urinary symptoms have resolved, occasional intermittent dull ache abdomen/flank. Advised patient OV recommended for further evaluation if symptoms. Patient declines at this time, will continue to monitor. Patient will return call if symptoms do not fully resolve or new symptoms develop.   Routing to provider for final review. Patient is agreeable to disposition. Will close encounter.  Cc: Angel Silva

## 2019-01-21 NOTE — Telephone Encounter (Signed)
See open telephone encounter dated 01/13/19.   Encounter closed.

## 2019-01-21 NOTE — Telephone Encounter (Signed)
Call placed to patient to convey benefits for ultrasound. Spoke with the patient and conveyed the benefits. Patient understands/agreeable with the benefits. Patient is not sure about scheduling appointment. She states she thinks it should be in 3 months. Please call patient for scheduling.

## 2019-01-24 ENCOUNTER — Encounter: Payer: Self-pay | Admitting: Obstetrics and Gynecology

## 2019-01-27 ENCOUNTER — Telehealth: Payer: Self-pay | Admitting: Obstetrics and Gynecology

## 2019-01-27 NOTE — Telephone Encounter (Signed)
Patient sent the following correspondence through MyChart.  Hi Dr Oscar La,  Is there anything I can do for my hot flashes? I don't always sweat but I'm always extremely hot and it's hard to get a good nights sleep.  Thank you,  Angel Silva

## 2019-01-27 NOTE — Telephone Encounter (Signed)
Pt sent Mychart message this morning.   Pt c/o hot flashes and insomnia. Pt had AEX on 12/13/18. Pt on estradiol tabs as of 12/16/18.   Will route to Dr Oscar La for review and recommendations. Please advise.

## 2019-01-27 NOTE — Telephone Encounter (Signed)
Spoke to pt. Pt given information per Dr Oscar La. Pt agreeable to video visit for vasomotor sx. Pt scheduled on 01/30/2019 at 10am. Pt verbalized understanding.   Will route to Dr Oscar La for review and will close encounter.

## 2019-01-27 NOTE — Telephone Encounter (Signed)
She is only on vaginal estrogen. Please set up a video visit so we can discuss options for treating her vasomotor symptoms.

## 2019-01-30 ENCOUNTER — Encounter: Payer: Self-pay | Admitting: Obstetrics and Gynecology

## 2019-01-30 ENCOUNTER — Telehealth: Payer: Self-pay | Admitting: Obstetrics and Gynecology

## 2019-01-30 ENCOUNTER — Telehealth (INDEPENDENT_AMBULATORY_CARE_PROVIDER_SITE_OTHER): Payer: BC Managed Care – PPO | Admitting: Obstetrics and Gynecology

## 2019-01-30 DIAGNOSIS — N951 Menopausal and female climacteric states: Secondary | ICD-10-CM

## 2019-01-30 DIAGNOSIS — R6882 Decreased libido: Secondary | ICD-10-CM

## 2019-01-30 MED ORDER — GABAPENTIN 100 MG PO CAPS: ORAL_CAPSULE | ORAL | 1 refills | Status: DC

## 2019-01-30 NOTE — Telephone Encounter (Signed)
mychart message sent

## 2019-01-30 NOTE — Patient Instructions (Addendum)
Menopause Menopause is the normal time of life when menstrual periods stop completely. It is usually confirmed by 12 months without a menstrual period. The transition to menopause (perimenopause) most often happens between the ages of 45 and 55. During perimenopause, hormone levels change in your body, which can cause symptoms and affect your health. Menopause may increase your risk for:  Loss of bone (osteoporosis), which causes bone breaks (fractures).  Depression.  Hardening and narrowing of the arteries (atherosclerosis), which can cause heart attacks and strokes. What are the causes? This condition is usually caused by a natural change in hormone levels that happens as you get older. The condition may also be caused by surgery to remove both ovaries (bilateral oophorectomy). What increases the risk? This condition is more likely to start at an earlier age if you have certain medical conditions or treatments, including:  A tumor of the pituitary gland in the brain.  A disease that affects the ovaries and hormone production.  Radiation treatment for cancer.  Certain cancer treatments, such as chemotherapy or hormone (anti-estrogen) therapy.  Heavy smoking and excessive alcohol use.  Family history of early menopause. This condition is also more likely to develop earlier in women who are very thin. What are the signs or symptoms? Symptoms of this condition include:  Hot flashes.  Irregular menstrual periods.  Night sweats.  Changes in feelings about sex. This could be a decrease in sex drive or an increased comfort around your sexuality.  Vaginal dryness and thinning of the vaginal walls. This may cause painful intercourse.  Dryness of the skin and development of wrinkles.  Headaches.  Problems sleeping (insomnia).  Mood swings or irritability.  Memory problems.  Weight gain.  Hair growth on the face and chest.  Bladder infections or problems with urinating. How  is this diagnosed? This condition is diagnosed based on your medical history, a physical exam, your age, your menstrual history, and your symptoms. Hormone tests may also be done. How is this treated? In some cases, no treatment is needed. You and your health care provider should make a decision together about whether treatment is necessary. Treatment will be based on your individual condition and preferences. Treatment for this condition focuses on managing symptoms. Treatment may include:  Menopausal hormone therapy (MHT).  Medicines to treat specific symptoms or complications.  Acupuncture.  Vitamin or herbal supplements. Before starting treatment, make sure to let your health care provider know if you have a personal or family history of:  Heart disease.  Breast cancer.  Blood clots.  Diabetes.  Osteoporosis. Follow these instructions at home: Lifestyle  Do not use any products that contain nicotine or tobacco, such as cigarettes and e-cigarettes. If you need help quitting, ask your health care provider.  Get at least 30 minutes of physical activity on 5 or more days each week.  Avoid alcoholic and caffeinated beverages, as well as spicy foods. This may help prevent hot flashes.  Get 7-8 hours of sleep each night.  If you have hot flashes, try: ? Dressing in layers. ? Avoiding things that may trigger hot flashes, such as spicy food, warm places, or stress. ? Taking slow, deep breaths when a hot flash starts. ? Keeping a fan in your home and office.  Find ways to manage stress, such as deep breathing, meditation, or journaling.  Consider going to group therapy with other women who are having menopause symptoms. Ask your health care provider about recommended group therapy meetings. Eating and   drinking  Eat a healthy, balanced diet that contains whole grains, lean protein, low-fat dairy, and plenty of fruits and vegetables.  Your health care provider may recommend  adding more soy to your diet. Foods that contain soy include tofu, tempeh, and soy milk.  Eat plenty of foods that contain calcium and vitamin D for bone health. Items that are rich in calcium include low-fat milk, yogurt, beans, almonds, sardines, broccoli, and kale. Medicines  Take over-the-counter and prescription medicines only as told by your health care provider.  Talk with your health care provider before starting any herbal supplements. If prescribed, take vitamins and supplements as told by your health care provider. These may include: ? Calcium. Women age 51 and older should get 1,200 mg (milligrams) of calcium every day. ? Vitamin D. Women need 600-800 International Units of vitamin D each day. ? Vitamins B12 and B6. Aim for 50 micrograms of B12 and 1.5 mg of B6 each day. General instructions  Keep track of your menstrual periods, including: ? When they occur. ? How heavy they are and how long they last. ? How much time passes between periods.  Keep track of your symptoms, noting when they start, how often you have them, and how long they last.  Use vaginal lubricants or moisturizers to help with vaginal dryness and improve comfort during sex.  Keep all follow-up visits as told by your health care provider. This is important. This includes any group therapy or counseling. Contact a health care provider if:  You are still having menstrual periods after age 55.  You have pain during sex.  You have not had a period for 12 months and you develop vaginal bleeding. Get help right away if:  You have: ? Severe depression. ? Excessive vaginal bleeding. ? Pain when you urinate. ? A fast or irregular heart beat (palpitations). ? Severe headaches. ? Abdomen (abdominal) pain or severe indigestion.  You fell and you think you have a broken bone.  You develop leg or chest pain.  You develop vision problems.  You feel a lump in your breast. Summary  Menopause is the normal  time of life when menstrual periods stop completely. It is usually confirmed by 12 months without a menstrual period.  The transition to menopause (perimenopause) most often happens between the ages of 45 and 55.  Symptoms can be managed through medicines, lifestyle changes, and complementary therapies such as acupuncture.  Eat a balanced diet that is rich in nutrients to promote bone health and heart health and to manage symptoms during menopause. This information is not intended to replace advice given to you by your health care provider. Make sure you discuss any questions you have with your health care provider. Document Revised: 12/01/2016 Document Reviewed: 01/22/2016 Elsevier Patient Education  2020 Elsevier Inc. Menopause and Hormone Replacement Therapy Menopause is a normal time of life when menstrual periods stop completely and the ovaries stop producing the female hormones estrogen and progesterone. This lack of hormones can affect your health and cause undesirable symptoms. Hormone replacement therapy (HRT) can relieve some of those symptoms. What is hormone replacement therapy? HRT is the use of artificial (synthetic) hormones to replace hormones that your body has stopped producing because you have reached menopause. What are my options for HRT?  HRT may consist of the synthetic hormones estrogen and progestin, or it may consist of only estrogen (estrogen-only therapy). You and your health care provider will decide which form of HRT is best for   you. If you choose to be on HRT and you have a uterus, estrogen and progestin are usually prescribed. Estrogen-only therapy is used for women who do not have a uterus. Possible options for taking HRT include:  Pills.  Patches.  Gels.  Sprays.  Vaginal cream.  Vaginal rings.  Vaginal inserts. The amount of hormone(s) that you take and how long you take the hormone(s) varies according to your health. It is important to:  Begin  HRT with the lowest possible dosage.  Stop HRT as soon as your health care provider tells you to stop.  Work with your health care provider so that you feel informed and comfortable with your decisions. What are the benefits of HRT? HRT can reduce the frequency and severity of menopausal symptoms. Benefits of HRT vary according to the kind of symptoms that you have, how severe they are, and your overall health. HRT may help to improve the following symptoms of menopause:  Hot flashes and night sweats. These are sudden feelings of heat that spread over the face and body. The skin may turn red, like a blush. Night sweats are hot flashes that happen while you are sleeping or trying to sleep.  Bone loss (osteoporosis). The body loses calcium more quickly after menopause, causing the bones to become weaker. This can increase the risk for bone breaks (fractures).  Vaginal dryness. The lining of the vagina can become thin and dry, which can cause pain during sex or cause infection, burning, or itching.  Urinary tract infections.  Urinary incontinence. This is the inability to control when you pass urine.  Irritability.  Short-term memory problems. What are the risks of HRT? Risks of HRT vary depending on your individual health and medical history. Risks of HRT also depend on whether you receive both estrogen and progestin or you receive estrogen only. HRT may increase the risk of:  Spotting. This is when a small amount of blood leaks from the vagina unexpectedly.  Endometrial cancer. This cancer is in the lining of the uterus (endometrium).  Breast cancer.  Increased density of breast tissue. This can make it harder to find breast cancer on a breast X-ray (mammogram).  Stroke.  Heart disease.  Blood clots.  Gallbladder disease.  Liver disease. Risks of HRT can increase if you have any of the following conditions:  Endometrial cancer.  Liver disease.  Heart disease.  Breast  cancer.  History of blood clots.  History of stroke. Follow these instructions at home:  Take over-the-counter and prescription medicines only as told by your health care provider.  Get mammograms, pelvic exams, and medical checkups as often as told by your health care provider.  Have Pap tests done as often as told by your health care provider. A Pap test is sometimes called a Pap smear. It is a screening test that is used to check for signs of cancer of the cervix and vagina. A Pap test can also identify the presence of infection or precancerous changes. Pap tests may be done: ? Every 3 years, starting at age 21. ? Every 5 years, starting after age 30, in combination with testing for human papillomavirus (HPV). ? More often or less often depending on other medical conditions you have, your age, and other risk factors.  It is up to you to get the results of your Pap test. Ask your health care provider, or the department that is doing the test, when your results will be ready.  Keep all follow-up visits   as told by your health care provider. This is important. Contact a health care provider if you have:  Pain or swelling in your legs.  Shortness of breath.  Chest pain.  Lumps or changes in your breasts or armpits.  Slurred speech.  Pain, burning, or bleeding when you urinate.  Unusual vaginal bleeding.  Dizziness or headaches.  Weakness or numbness in any part of your arms or legs.  Pain in your abdomen. Summary  Menopause is a normal time of life when menstrual periods stop completely and the ovaries stop producing the female hormones estrogen and progesterone.  Hormone replacement therapy (HRT) can relieve some of the symptoms of menopause.  HRT can reduce the frequency and severity of menopausal symptoms.  Risks of HRT vary depending on your individual health and medical history. This information is not intended to replace advice given to you by your health care  provider. Make sure you discuss any questions you have with your health care provider. Document Revised: 08/21/2017 Document Reviewed: 08/21/2017 Elsevier Patient Education  2020 Elsevier Inc.  

## 2019-01-30 NOTE — Progress Notes (Signed)
Virtual Visit via Video Note  I connected with Angel Silva on 01/30/19 at 10:00 AM EST by a video enabled telemedicine application and verified that I am speaking with the correct person using two identifiers.  Location: Patient: Home Provider: Office at [facility]   I discussed the limitations of evaluation and management by telemedicine and the availability of in person appointments. The patient expressed understanding and agreed to proceed.  GYNECOLOGY  VISIT   HPI: 54 y.o.   Divorced White or Caucasian Not Hispanic or Latino  female   [phone] with Patient's last menstrual period was 01/03/2000.   here for a virtual visit to discuss vasomotor symptoms. She has been having vasomotor symptoms for years. Over the last month symptoms have been worse. She has been waking up multiple times at night, sometimes sweating, sometimes just hot. Has trouble getting back to sleep. Feels hot during the day. Dresses in layers. As the day goes on she is getting overwhelmingly hot. Night time is the worst. No mood changes. Has vaginal estrogen, dryness has improved.  FSH earlier this month was 57.7 mIU/ml. She has had a hysterectomy.   She was on testosterone, went off, but recently restarted it. She will come in in a month for blood work.   GYNECOLOGIC HISTORY: Patient's last menstrual period was 01/03/2000. Contraception: hysterectomy Menopausal hormone therapy: none, uses vaginal estrogen        OB History    Gravida  5   Para  3   Term  1   Preterm  2   AB  2   Living  3     SAB      TAB  1   Ectopic      Multiple      Live Births  3              Patient Active Problem List   Diagnosis Date Noted  . Chronic constipation 12/06/2018  . Status post arthroscopy of left shoulder 08/27/2018  . Rotator cuff tendonitis, left 01/25/2017  . Generalized anxiety disorder 12/29/2014  . Chronic left shoulder pain 07/11/2013  . Insomnia 04/11/2013  .  Atypical chest pain 01/17/2013  . Dyspnea 01/03/2013  . Esophageal reflux 05/30/2012  . Depression 05/30/2012  . Chronic cough 05/30/2012  No cardiac issues  Past Medical History:  Diagnosis Date  . Abnormal Pap smear of cervix    over 30 yrs ago  . Anxiety   . Chronic joint pain   . Depression   . IBS (irritable bowel syndrome)   . Reflux     Past Surgical History:  Procedure Laterality Date  . ABDOMINAL HYSTERECTOMY  2002   TAH- Fibroids , endometriosis; ov retained  . BACK SURGERY  2005   L5-S1 Fusion  . CESAREAN SECTION  1987  . ESOPHAGOGASTRODUODENOSCOPY    . LAPAROSCOPIC CHOLECYSTECTOMY  1995  . MASTOPEXY  2012  . REDUCTION MAMMAPLASTY Bilateral   . SHOULDER SURGERY    . TONSILLECTOMY    . TUBAL LIGATION  1993   BTSP    Current Outpatient Medications  Medication Sig Dispense Refill  . albuterol (PROVENTIL HFA;VENTOLIN HFA) 108 (90 Base) MCG/ACT inhaler Inhale 2 puffs into the lungs every 6 (six) hours as needed for wheezing or shortness of breath. 1 Inhaler 2  . [place]wellia-Glucosamine-Vit D (OSTEO BI-FLEX ONE PER DAY PO) Take by mouth.    . clonazePAM (KLONOPIN) 1 MG tablet TAKE 1 TABLET BY MOUTH ONCE DAILY AS NEEDED  30 tablet 5  . Estradiol 10 MCG TABS vaginal tablet Place 1 tablet (10 mcg total) vaginally 2 (two) times a week. 24 tablet 3  . ibuprofen (ADVIL,MOTRIN) 800 MG tablet Take 1 tablet (800 mg total) by mouth every 8 (eight) hours as needed. 30 tablet 1  . metroNIDAZOLE (FLAGYL) 500 MG tablet Take 1 tablet (500 mg total) by mouth 2 (two) times daily. 14 tablet 0  . Multiple Vitamin (MULTIVITAMIN) tablet Take 1 tablet by mouth daily.    . nitrofurantoin (MACRODANTIN) 50 MG capsule SMARTSIG:1 Tablet(s) By Mouth As Needed    . Plecanatide (TRULANCE PO) Take by mouth.    . Probiotic Product (PROBIOTIC PO) Take by mouth.    . traZODone (DESYREL) 50 MG tablet Take 1-2 tablets (50-100 mg total) by mouth at bedtime as needed for sleep. 45 tablet 5   No current  facility-administered medications for this visit.     ALLERGIES: Effexor [venlafaxine hcl], Sulfa antibiotics, and Zoloft [sertraline hcl]  Family History  Problem Relation Age of Onset  . Hypertension Mother   . Diabetes Mother        Type 2  . Thyroid disease Mother   . Glaucoma Mother   . Hypertension Father   . Atrial fibrillation Father   . Cancer Father        bladder & liver  . Heart disease Father   . Crohn's disease Father   . Heart attack Maternal Grandfather   . Diabetes Maternal Grandmother        Type 2  . Hypertension Maternal Grandmother   . Heart disease Maternal Grandmother   . Heart disease Paternal Grandmother   . CAD Maternal Uncle   . Hypertension Brother   . Melanoma Brother   . Heart murmur Brother   . Other Brother        leaky aortic valve  . Breast cancer Neg Hx     Social History   Socioeconomic History  . Marital status: Divorced    Spouse name: Not on file  . Number of children: Not on file  . Years of education: Not on file  . Highest education level: Not on file  Occupational History  . Not on file  Tobacco Use  . Smoking status: Former Games developer  . Smokeless tobacco: Never Used  Substance and Sexual Activity  . Alcohol use: Yes    Alcohol/week: 0.0 - 3.0 standard drinks  . Drug use: No  . Sexual activity: Yes    Partners: Male    Birth control/protection: Surgical    Comment: TAH  Other Topics Concern  . Not on file  Social History Narrative  . Not on file   Social Determinants of Health   Financial Resource Strain:   . Difficulty of Paying Living Expenses: Not on file  Food Insecurity:   . Worried About Programme researcher, broadcasting/film/video in the Last Year: Not on file  . Ran Out of Food in the Last Year: Not on file  Transportation Needs:   . Lack of Transportation (Medical): Not on file  . Lack of Transportation (Non-Medical): Not on file  Physical Activity:   . Days of Exercise per Week: Not on file  . Minutes of Exercise per  Session: Not on file  Stress:   . Feeling of Stress : Not on file  Social Connections:   . Frequency of Communication with Friends and Family: Not on file  . Frequency of Social Gatherings with Friends and Family:  Not on file  . Attends Religious Services: Not on file  . Active Member of Clubs or Organizations: Not on file  . Attends Archivist Meetings: Not on file  . Marital Status: Not on file  Intimate Partner Violence:   . Fear of Current or Ex-Partner: Not on file  . Emotionally Abused: Not on file  . Physically Abused: Not on file  . Sexually Abused: Not on file    ROS  PHYSICAL EXAMINATION:    LMP 01/03/2000     General appearance: alert, cooperative and appears stated age  ASSESSMENT Worsening vasomotor symptoms. Discussed options for treatment, including: behavioral changes, herbal products, SSRI's, gabapentin and estrogen. Reviewed risks and side effects Low libido, was on testosterone, stopped taking it consistently but has restarted it.     PLAN She would like to try gabapentin. Will start at 100 mg qhs and increase to 300 mg qhs.  Will f/u in 2-4 weeks Will return in one month for testosterone levels    I discussed the assessment and treatment plan with the patient. The patient was provided an opportunity to ask questions and all were answered. The patient agreed with the plan and demonstrated an understanding of the instructions.   The patient was advised to call back or seek an in-person evaluation if the symptoms worsen or if the condition fails to improve as anticipated.  I provided over 20 minutes of total patient care   Salvadore Dom, MD

## 2019-02-05 ENCOUNTER — Encounter: Payer: Self-pay | Admitting: Family Medicine

## 2019-02-06 ENCOUNTER — Encounter: Payer: Self-pay | Admitting: Obstetrics and Gynecology

## 2019-02-06 ENCOUNTER — Telehealth: Payer: Self-pay | Admitting: Obstetrics and Gynecology

## 2019-02-06 NOTE — Telephone Encounter (Signed)
Spoke with patient. PUS r/s to 02/11/19 at 1:30pm, consult to follow at 2pm with Dr. Oscar La.   Routing to provider for final review. Patient is agreeable to disposition. Will close encounter.  Cc: Soundra Pilon

## 2019-02-06 NOTE — Telephone Encounter (Signed)
Left message to call Alma Muegge, RN at [facility] [phone].   

## 2019-02-06 NOTE — Telephone Encounter (Signed)
Patient returned call

## 2019-02-06 NOTE — Telephone Encounter (Signed)
Visit Follow-Up Question Received: Today Message Contents  [name] "Angel Silva" sent to [facility]  Phone Number: [phone]  Good morning,   I'm supposed to have an ultrasound done on 03/25/2019 to look at the cysts on my ovaries. It looks like I will be changing jobs in early March. I probably won't have insurance on 3/23 to cover the ultrasound. Would it be possible to do it sooner, possibly February 16th?   Thank you,   Angel Silva

## 2019-02-06 NOTE — Telephone Encounter (Signed)
Sure, move the ultrasound up. Thanks,

## 2019-02-06 NOTE — Telephone Encounter (Signed)
Dr. Oscar Silva -PUS for 3 mo f/u of bilateral ovarian cyst found on 12/19/18 CT, due 03/2019, ok to schedule earlier?

## 2019-02-10 ENCOUNTER — Other Ambulatory Visit: Payer: Self-pay

## 2019-02-11 ENCOUNTER — Encounter: Payer: Self-pay | Admitting: Obstetrics and Gynecology

## 2019-02-11 ENCOUNTER — Ambulatory Visit (INDEPENDENT_AMBULATORY_CARE_PROVIDER_SITE_OTHER): Payer: BC Managed Care – PPO

## 2019-02-11 ENCOUNTER — Ambulatory Visit (INDEPENDENT_AMBULATORY_CARE_PROVIDER_SITE_OTHER): Payer: BC Managed Care – PPO | Admitting: Obstetrics and Gynecology

## 2019-02-11 VITALS — BP 120/74 | HR 83 | Temp 97.7°F | Ht 63.0 in | Wt 142.0 lb

## 2019-02-11 DIAGNOSIS — N83201 Unspecified ovarian cyst, right side: Secondary | ICD-10-CM

## 2019-02-11 DIAGNOSIS — N951 Menopausal and female climacteric states: Secondary | ICD-10-CM | POA: Diagnosis not present

## 2019-02-11 DIAGNOSIS — N83202 Unspecified ovarian cyst, left side: Secondary | ICD-10-CM | POA: Diagnosis not present

## 2019-02-11 MED ORDER — GABAPENTIN 300 MG PO CAPS: 300.0000 mg | ORAL_CAPSULE | Freq: Every day | ORAL | 3 refills | Status: DC

## 2019-02-11 NOTE — Progress Notes (Signed)
GYNECOLOGY  VISIT   HPI: 54 y.o.   Divorced White or Caucasian Not Hispanic or Latino  female   [phone] with Patient's last menstrual period was 01/03/2000.   here for   Pelvic ultrasound to evaluate ovarian cysts that were incidentally noted on CT in 12/20. She has had a hysterectomy, FSH last month returned at 57.7.   She is also here to f/u on vasomotor symptoms. A few weeks ago she was started on Gabapentin at bedtime for her night sweats/hot flashes at night. She is up to taking 300 mg at night. She is sleeping through the night, feeling much better. She does feel a little down and isn't sure if it is from the gabapentin. She is also under stress and looking for a new job. She has a job interview today.  GYNECOLOGIC HISTORY: Patient's last menstrual period was 01/03/2000. Contraception:post menopausal  Menopausal hormone therapy: Estrogen and testosterone cream         OB History    Gravida  5   Para  3   Term  1   Preterm  2   AB  2   Living  3     SAB      TAB  1   Ectopic      Multiple      Live Births  3              Patient Active Problem List   Diagnosis Date Noted  . Chronic constipation 12/06/2018  . Status post arthroscopy of left shoulder 08/27/2018  . Rotator cuff tendonitis, left 01/25/2017  . Generalized anxiety disorder 12/29/2014  . Chronic left shoulder pain 07/11/2013  . Insomnia 04/11/2013  . Atypical chest pain 01/17/2013  . Dyspnea 01/03/2013  . Esophageal reflux 05/30/2012  . Depression 05/30/2012  . Chronic cough 05/30/2012    Past Medical History:  Diagnosis Date  . Abnormal Pap smear of cervix    over 30 yrs ago  . Anxiety   . Chronic joint pain   . Depression   . IBS (irritable bowel syndrome)   . Reflux     Past Surgical History:  Procedure Laterality Date  . ABDOMINAL HYSTERECTOMY  2002   TAH- Fibroids , endometriosis; ov retained  . BACK SURGERY  2005   L5-S1 Fusion  . CESAREAN SECTION  1987  .  ESOPHAGOGASTRODUODENOSCOPY    . LAPAROSCOPIC CHOLECYSTECTOMY  1995  . MASTOPEXY  2012  . REDUCTION MAMMAPLASTY Bilateral   . SHOULDER SURGERY    . TONSILLECTOMY    . TUBAL LIGATION  1993   BTSP    Current Outpatient Medications  Medication Sig Dispense Refill  . albuterol (PROVENTIL HFA;VENTOLIN HFA) 108 (90 Base) MCG/ACT inhaler Inhale 2 puffs into the lungs every 6 (six) hours as needed for wheezing or shortness of breath. 1 Inhaler 2  . Boswellia-Glucosamine-Vit D (OSTEO BI-FLEX ONE PER DAY PO) Take by mouth.    . clonazePAM (KLONOPIN) 1 MG tablet TAKE 1 TABLET BY MOUTH ONCE DAILY AS NEEDED 30 tablet 5  . Estradiol 10 MCG TABS vaginal tablet Place 1 tablet (10 mcg total) vaginally 2 (two) times a week. 24 tablet 3  . gabapentin (NEURONTIN) 100 MG capsule Take one tablet po qhs x 3 days, if tolerating increase to 2 tablets po qhs x 3 days, if tolerating increase to 3 tablets po qhs 90 capsule 1  . Multiple Vitamin (MULTIVITAMIN) tablet Take 1 tablet by mouth daily.    . nitrofurantoin (  MACRODANTIN) 50 MG capsule SMARTSIG:1 Tablet(s) By Mouth As Needed    . NONFORMULARY OR COMPOUNDED ITEM Apply 1 application topically daily. 1%Testosterone cream, apply 0.5 grams daily to the lower abdomen. Dispense 30 grams, no refills    . Probiotic Product (PROBIOTIC PO) Take by mouth.    . traZODone (DESYREL) 50 MG tablet Take 1-2 tablets (50-100 mg total) by mouth at bedtime as needed for sleep. 45 tablet 5  . ibuprofen (ADVIL,MOTRIN) 800 MG tablet Take 1 tablet (800 mg total) by mouth every 8 (eight) hours as needed. 30 tablet 1  . metroNIDAZOLE (FLAGYL) 500 MG tablet Take 1 tablet (500 mg total) by mouth 2 (two) times daily. 14 tablet 0  . Plecanatide (TRULANCE PO) Take by mouth.     No current facility-administered medications for this visit.     ALLERGIES: Effexor [venlafaxine hcl], Sulfa antibiotics, and Zoloft [sertraline hcl]  Family History  Problem Relation Age of Onset  . Hypertension  Mother   . Diabetes Mother        Type 2  . Thyroid disease Mother   . Glaucoma Mother   . Hypertension Father   . Atrial fibrillation Father   . Cancer Father        bladder & liver  . Heart disease Father   . Crohn's disease Father   . Heart attack Maternal Grandfather   . Diabetes Maternal Grandmother        Type 2  . Hypertension Maternal Grandmother   . Heart disease Maternal Grandmother   . Heart disease Paternal Grandmother   . CAD Maternal Uncle   . Hypertension Brother   . Melanoma Brother   . Heart murmur Brother   . Other Brother        leaky aortic valve  . Breast cancer Neg Hx     Social History   Socioeconomic History  . Marital status: Divorced    Spouse name: Not on file  . Number of children: Not on file  . Years of education: Not on file  . Highest education level: Not on file  Occupational History  . Not on file  Tobacco Use  . Smoking status: Former Games developer  . Smokeless tobacco: Never Used  Substance and Sexual Activity  . Alcohol use: Yes    Alcohol/week: 0.0 - 3.0 standard drinks  . Drug use: No  . Sexual activity: Yes    Partners: Male    Birth control/protection: Surgical    Comment: TAH  Other Topics Concern  . Not on file  Social History Narrative  . Not on file   Social Determinants of Health   Financial Resource Strain:   . Difficulty of Paying Living Expenses: Not on file  Food Insecurity:   . Worried About Programme researcher, broadcasting/film/video in the Last Year: Not on file  . Ran Out of Food in the Last Year: Not on file  Transportation Needs:   . Lack of Transportation (Medical): Not on file  . Lack of Transportation (Non-Medical): Not on file  Physical Activity:   . Days of Exercise per Week: Not on file  . Minutes of Exercise per Session: Not on file  Stress:   . Feeling of Stress : Not on file  Social Connections:   . Frequency of Communication with Friends and Family: Not on file  . Frequency of Social Gatherings with Friends and  Family: Not on file  . Attends Religious Services: Not on file  . Active Member  of Clubs or Organizations: Not on file  . Attends Banker Meetings: Not on file  . Marital Status: Not on file  Intimate Partner Violence:   . Fear of Current or Ex-Partner: Not on file  . Emotionally Abused: Not on file  . Physically Abused: Not on file  . Sexually Abused: Not on file    Review of Systems  All other systems reviewed and are negative.   PHYSICAL EXAMINATION:    BP 120/74   Pulse 83   Temp 97.7 F (36.5 C)   Ht 5\' 3"  (1.6 m)   Wt 142 lb (64.4 kg)   LMP 01/03/2000   SpO2 97%   BMI 25.15 kg/m     General appearance: alert, cooperative and appears stated age  Ultrasound images were reviewed with the patient. Several bilateral small simple cysts largest 13 mm  ASSESSMENT Incidental finding of bilateral ovarian cysts on ultrasound, suspect patient is peri not PMP, h/o hysterectomy. FSH 57.7 Vasomotor symptoms improved with gabapentin Mood changes    PLAN Will repeat the ultrasound in one year Will call in 300 mg gabapentin tablets, she will call if she feels she needs an adjustment She will also let me know if her mood changes don't improve

## 2019-02-12 ENCOUNTER — Encounter: Payer: Self-pay | Admitting: Obstetrics and Gynecology

## 2019-03-24 ENCOUNTER — Encounter: Payer: Self-pay | Admitting: Certified Nurse Midwife

## 2019-03-25 ENCOUNTER — Other Ambulatory Visit: Payer: Self-pay | Admitting: Obstetrics and Gynecology

## 2019-03-25 ENCOUNTER — Other Ambulatory Visit: Payer: Self-pay

## 2019-04-24 ENCOUNTER — Other Ambulatory Visit: Payer: Self-pay | Admitting: *Deleted

## 2019-04-25 MED ORDER — CLONAZEPAM 1 MG PO TABS: 1.0000 mg | ORAL_TABLET | Freq: Every day | ORAL | 2 refills | Status: DC | PRN

## 2019-04-25 MED ORDER — TRAZODONE HCL 50 MG PO TABS: 50.0000 mg | ORAL_TABLET | Freq: Every evening | ORAL | 0 refills | Status: DC | PRN

## 2019-04-25 NOTE — Telephone Encounter (Signed)
Rec one yr f u with dr Ladona Ridgel in June. Give three months worth

## 2019-04-25 NOTE — Telephone Encounter (Signed)
Scheduled 6/14

## 2019-04-25 NOTE — Telephone Encounter (Signed)
Please schedule and then route back to nurses 

## 2019-04-25 NOTE — Telephone Encounter (Signed)
lvm to schedule visit with Dr. Ladona Ridgel

## 2019-04-29 ENCOUNTER — Other Ambulatory Visit: Payer: Self-pay

## 2019-04-29 NOTE — Telephone Encounter (Signed)
Spoke with patient.   1. Patient denies any current symptoms. Takes 1 tab PO at onset of symptoms, states RX prescribed by Dr. Oscar La previously. Patient states she had a current RX, is transferring medications to mail order, needs a new Rx. Denies any current symptoms.   2. Patient is requesting lab appt for testosterone level. Has a old RX for testosterone that she restarted 1 month ago. Would like a new Rx. Patient will stop old Rx. Lab appt scheduled for 5/3 at 1:15pm.   Advised patient I will review request with Dr. Oscar La and our office will f/u with recommendations. Patient agreeable.   Routing to Dr. Oscar La.

## 2019-04-29 NOTE — Telephone Encounter (Signed)
Medication refill request: gabapentin 300mg  Last AEX: 12-13-2018 Next AEX: not scheduled Last MMG (if hormonal medication request): n/a Refill authorized: [facility]

## 2019-04-29 NOTE — Telephone Encounter (Signed)
Can you please speak with the patient about the macrodantin script. I can't find the original order or why it was ordered. Is she taking it daily, with intercourse and why? I don't see a documented infection in Epic,

## 2019-04-29 NOTE — Telephone Encounter (Signed)
Medication refill request: Macrodantin Last AEX:  12/13/18 JJ Last OV: 02/11/19 Next AEX: none scheduled  Last MMG (if hormonal medication request): 11/07/18 BIRADS 2 benign Refill authorized: Please advise; order pended #90 w/0 refills if authorized

## 2019-04-29 NOTE — Telephone Encounter (Signed)
Left message to call Sten Dematteo, RN at [facility] [phone].   

## 2019-05-01 NOTE — Telephone Encounter (Signed)
Spoke with [place] at [facility] in [place].  Was advised Rx macrodatin 50 mg capsule received on 05/24/18, #30 caps/0RF. Take one capsule PO prn, ordered by Dr. Oscar La.

## 2019-05-01 NOTE — Telephone Encounter (Signed)
Please refill the gabapentin. I don't think she should keep taking the [facility]

## 2019-05-02 MED ORDER — GABAPENTIN 300 MG PO CAPS: 300.0000 mg | ORAL_CAPSULE | Freq: Every day | ORAL | 2 refills | Status: DC

## 2019-05-02 NOTE — Telephone Encounter (Signed)
Reviewed testosterone labs and Rx recommendations with Dr. Oscar La on 05/01/19. Recommended continue current RX and come in for labs as soon as possible, don't stop Rx. Patient is scheduled for lab appt on 5/3.   Call to patient, left detailed message on home number, ok per dpr.  Advised of all recommendations per Dr. Oscar La. Rx for gabapentin has been sent to [facility]. If experiencing any UTI symptoms, should be evaluated, return call to office to schedule OV or if any additional questions.   Routing to provider for final review.  Will close encounter.

## 2019-05-05 ENCOUNTER — Other Ambulatory Visit: Payer: BC Managed Care – PPO

## 2019-05-05 ENCOUNTER — Other Ambulatory Visit: Payer: Self-pay

## 2019-05-05 ENCOUNTER — Other Ambulatory Visit: Payer: Self-pay | Admitting: *Deleted

## 2019-05-05 DIAGNOSIS — R6882 Decreased libido: Secondary | ICD-10-CM

## 2019-05-08 ENCOUNTER — Telehealth: Payer: Self-pay | Admitting: Family Medicine

## 2019-05-08 LAB — TESTT+TESTF+SHBG
Sex Hormone Binding: 81 nmol/L (ref 17.3–125.0)
Testosterone, Free: 1.3 pg/mL (ref 0.0–4.2)
Testosterone, Total, LC/MS: 25.2 ng/dL

## 2019-05-08 NOTE — Telephone Encounter (Signed)
[facility] is [facility] requesting clonazePAM (KLONOPIN) 1 MG tablet.   Also asking if pt can have a short supply until they can deliver hers.   Fax # [phone]

## 2019-05-08 NOTE — Telephone Encounter (Signed)
07/02/18 was last visit for anxiety. Would like a refill to go to mail order and a short supply to go to local pharm til mail order comes in.

## 2019-05-12 ENCOUNTER — Other Ambulatory Visit: Payer: Self-pay | Admitting: *Deleted

## 2019-05-12 MED ORDER — CLONAZEPAM 1 MG PO TABS: 1.0000 mg | ORAL_TABLET | Freq: Every day | ORAL | 5 refills | Status: DC | PRN

## 2019-05-12 NOTE — Telephone Encounter (Signed)
Medication refill request: Nitrofuratoin  Last AEX:  12-13-2018 JJ  Next AEX: not currently scheduled  Last MMG (if hormonal medication request): n/a Refill authorized: Today, please advise.   Medication pended for #90, 0RF. Please refill if appropriate.

## 2019-05-12 NOTE — Telephone Encounter (Signed)
30 locally and 30 plus five monthly refills, we do not do 90 d of this

## 2019-05-12 NOTE — Telephone Encounter (Signed)
Left message to return call 

## 2019-05-12 NOTE — Telephone Encounter (Signed)
Patient states she does not need refill at local pharmacy and just got refill last week and just wants [facility]. Prescription faxed to pharmacy

## 2019-05-13 ENCOUNTER — Telehealth: Payer: Self-pay | Admitting: *Deleted

## 2019-05-13 DIAGNOSIS — R6882 Decreased libido: Secondary | ICD-10-CM

## 2019-05-13 NOTE — Telephone Encounter (Signed)
Angel Min, RN  05/13/2019 11:24 AM EDT    Spoke with patient. Patient states she stopped the testosterone cream a few days prior to the lab draw. Patient states she did not receive message left on 4/30 until after she had already stopped the medication. Patient reports she was using the 0.5% and it works, but the 1% worked better and more effective. Request RX to [facility]. Advised patient I will update Dr. Oscar La and return call to advise on Rx, patient agreeable.   See telephone encounter dated 05/13/19.      Dr. Oscar La -please review and advise on RX.

## 2019-05-13 NOTE — Telephone Encounter (Signed)
-----   Message from Romualdo Bolk, MD sent at 05/12/2019 12:43 PM EDT ----- Please confirm with the patient that she was still on the testosterone at the time of the lab visit. If so was it the last script or the prior one. The last script was 0.5% testosterone cream and the prior one was 1%. The recent level was fine, does she feel it is working for her? If so then you can refill the last script for 6 months.

## 2019-05-15 NOTE — Telephone Encounter (Signed)
She should go back on the 0.5% cream, 1/2 gram daily to the lower abdomen. Then check a level in one month (while still on the medication). Her testosterone level got too high on the 1%cream

## 2019-05-16 MED ORDER — NONFORMULARY OR COMPOUNDED ITEM: 0 refills | Status: DC

## 2019-05-16 NOTE — Telephone Encounter (Signed)
Call placed to [facility], spoke with Vernona Rieger. Verbal order for 0.5% Testosterone cream. Apply 0.5 grams daily to the lower abdomen daily. Dispense 15 grams. No refills. Read back and confirmed.    Routing to provider for final review. Will close encounter.

## 2019-05-16 NOTE — Telephone Encounter (Signed)
Call to patient, left detailed message, ok per dpr, name identified on voicemail.  Advised as seen below per Dr. Oscar La, RX sent to [facility]. Future lab order placed for 1 mo Testosterone level, return call to office to schedule. Return call if any additional questions.    Order placed in Epic for 0.5% Testosterone cream. Apply 0.5 grams daily to the lower abdomen daily. Dispense 15 grams. No refills. Rx printed.  Dr. Oscar La is out of the office today, will call verbal order when pharmacy opens at 9am.

## 2019-06-16 ENCOUNTER — Other Ambulatory Visit: Payer: Self-pay

## 2019-06-16 ENCOUNTER — Encounter: Payer: Self-pay | Admitting: Family Medicine

## 2019-06-16 ENCOUNTER — Ambulatory Visit: Payer: BC Managed Care – PPO | Admitting: Family Medicine

## 2019-06-16 VITALS — BP 126/88 | HR 98 | Temp 98.4°F | Wt 147.6 lb

## 2019-06-16 DIAGNOSIS — R062 Wheezing: Secondary | ICD-10-CM | POA: Diagnosis not present

## 2019-06-16 DIAGNOSIS — F321 Major depressive disorder, single episode, moderate: Secondary | ICD-10-CM

## 2019-06-16 DIAGNOSIS — F5101 Primary insomnia: Secondary | ICD-10-CM | POA: Diagnosis not present

## 2019-06-16 MED ORDER — TRAZODONE HCL 50 MG PO TABS: 50.0000 mg | ORAL_TABLET | Freq: Every evening | ORAL | 0 refills | Status: DC | PRN

## 2019-06-16 NOTE — Patient Instructions (Signed)
Tapering off the klonapin at night taking 1/2 tab at night for 1 wk, then taking it every other night for 1 wk.  Cont with the gabapentin and trazodone.   Call if having any issues.

## 2019-06-16 NOTE — Progress Notes (Signed)
Patient ID: Angel Silva, female    DOB: 1965/08/27, 54 y.o.   MRN: [id number]   Chief Complaint  Patient presents with  . Insomnia   Subjective:    HPI Pt seen for f/u on anxiety/insomnia. Pt taking klonapin 1mg  at night qhs. Started gabapentin in past year, 300mg  qhs for hot flashes. Pt also taking trazodone 50-100mg  qhs for insomnia. She used to take wellbutrin for depression for years and pt decided to discontinue this yrs ago.  Patient complains of tightness in chest, slight sob and wheezing for the last month or so. Noticing wheezing with exercising some tightness.  No pain radiating to neck, arm or back.   Medical History Emaline has a past medical history of Abnormal Pap smear of cervix, Anxiety, Chronic joint pain, Depression, IBS (irritable bowel syndrome), and Reflux.   Outpatient Encounter Medications as of 06/16/2019  Medication Sig  . albuterol (PROVENTIL HFA;VENTOLIN HFA) 108 (90 Base) MCG/ACT inhaler Inhale 2 puffs into the lungs every 6 (six) hours as needed for wheezing or shortness of breath.  . Boswellia-Glucosamine-Vit D (OSTEO BI-FLEX ONE PER DAY PO) Take by mouth.  . clonazePAM (KLONOPIN) 1 MG tablet Take 1 tablet (1 mg total) by mouth daily as needed.  . gabapentin (NEURONTIN) 300 MG capsule Take 1 capsule (300 mg total) by mouth at bedtime.  Marland Kitchen ibuprofen (ADVIL,MOTRIN) 800 MG tablet Take 1 tablet (800 mg total) by mouth every 8 (eight) hours as needed.  . Multiple Vitamin (MULTIVITAMIN) tablet Take 1 tablet by mouth daily.  . NONFORMULARY OR COMPOUNDED ITEM 0.5% Testosterone cream. Apply 0.5 grams daily to the lower abdomen daily. Dispense 15 grams. No refills.  . Probiotic Product (PROBIOTIC PO) Take by mouth.  Derrill Memo ON 07/22/2019] traZODone (DESYREL) 50 MG tablet Take 1-2 tablets (50-100 mg total) by mouth at bedtime as needed for sleep.  . [DISCONTINUED] Estradiol 10 MCG TABS vaginal tablet Place 1 tablet (10 mcg total) vaginally 2 (two) times a  week. (Patient not taking: Reported on 06/16/2019)  . [DISCONTINUED] metroNIDAZOLE (FLAGYL) 500 MG tablet Take 1 tablet (500 mg total) by mouth 2 (two) times daily.  . [DISCONTINUED] nitrofurantoin (MACRODANTIN) 50 MG capsule SMARTSIG:1 Tablet(s) By Mouth As Needed  . [DISCONTINUED] Plecanatide (TRULANCE PO) Take by mouth.  . [DISCONTINUED] traZODone (DESYREL) 50 MG tablet Take 1-2 tablets (50-100 mg total) by mouth at bedtime as needed for sleep.   No facility-administered encounter medications on file as of 06/16/2019.     Review of Systems  Constitutional: Negative for chills and fever.  HENT: Negative for congestion, ear pain, postnasal drip, rhinorrhea and sore throat.   Respiratory: Positive for chest tightness, shortness of breath and wheezing. Negative for cough.   Cardiovascular: Negative for chest pain and leg swelling.  Gastrointestinal: Negative for abdominal pain, diarrhea, nausea and vomiting.  Genitourinary: Negative for dysuria and frequency.  Musculoskeletal: Negative for arthralgias and back pain.  Skin: Negative for rash.  Neurological: Negative for dizziness, weakness and headaches.  Psychiatric/Behavioral: Positive for sleep disturbance. Negative for agitation, behavioral problems, dysphoric mood, self-injury and suicidal ideas. The patient is not nervous/anxious.      Vitals BP 126/88   Pulse 98   Temp 98.4 F (36.9 C)   Wt 147 lb 9.6 oz (67 kg)   LMP 01/03/2000   SpO2 99%   BMI 26.15 kg/m   Objective:   Physical Exam Vitals and nursing note reviewed.  Constitutional:      General: She is not in  acute distress.    Appearance: Normal appearance.  HENT:     Head: Normocephalic and atraumatic.     Nose: Nose normal.     Mouth/Throat:     Mouth: Mucous membranes are moist.     Pharynx: Oropharynx is clear.  Eyes:     Extraocular Movements: Extraocular movements intact.     Conjunctiva/sclera: Conjunctivae normal.     Pupils: Pupils are equal, round,  and reactive to light.  Cardiovascular:     Rate and Rhythm: Normal rate and regular rhythm.     Pulses: Normal pulses.     Heart sounds: Normal heart sounds. No murmur heard.   Pulmonary:     Effort: Pulmonary effort is normal. No respiratory distress.     Breath sounds: Normal breath sounds. No wheezing, rhonchi or rales.  Musculoskeletal:        General: Normal range of motion.     Right lower leg: No edema.     Left lower leg: No edema.  Skin:    General: Skin is warm and dry.     Findings: No lesion or rash.  Neurological:     General: No focal deficit present.     Mental Status: She is alert and oriented to person, place, and time.     Cranial Nerves: No cranial nerve deficit.  Psychiatric:        Mood and Affect: Mood normal.        Behavior: Behavior normal.        Thought Content: Thought content normal.        Judgment: Judgment normal.       Assessment and Plan   1. Current moderate episode of major depressive disorder without prior episode (HCC)  2. Wheezing - Pulmonary function test; Future  3. Primary insomnia    Refill due on 07/25/19 for trazodone, sent for refill to start on 07/22/19 for 90 day supply. Pt going to try to taper off the klonapin did get a 6 mon supply dr. Brett Canales in 4/21. Pt stating she will try 1/2 tab qhs then tak every other day for 7 days. Then discontinue the next week. Pt to continue with gabapentin 300mg  qhs for hot flashes, seeing gyn for this.  Will send pt for pft for evaluation of sob and wheezing.  F/u 2mo or prn.

## 2019-09-03 ENCOUNTER — Encounter: Payer: Self-pay | Admitting: Obstetrics and Gynecology

## 2019-09-03 ENCOUNTER — Other Ambulatory Visit: Payer: Self-pay

## 2019-09-03 ENCOUNTER — Telehealth: Payer: Self-pay

## 2019-09-03 ENCOUNTER — Ambulatory Visit: Payer: BC Managed Care – PPO | Admitting: Obstetrics and Gynecology

## 2019-09-03 VITALS — BP 120/64 | HR 94 | Ht 63.0 in | Wt 146.0 lb

## 2019-09-03 DIAGNOSIS — R5381 Other malaise: Secondary | ICD-10-CM | POA: Diagnosis not present

## 2019-09-03 DIAGNOSIS — N949 Unspecified condition associated with female genital organs and menstrual cycle: Secondary | ICD-10-CM | POA: Diagnosis not present

## 2019-09-03 DIAGNOSIS — R109 Unspecified abdominal pain: Secondary | ICD-10-CM

## 2019-09-03 DIAGNOSIS — R102 Pelvic and perineal pain: Secondary | ICD-10-CM | POA: Diagnosis not present

## 2019-09-03 NOTE — Progress Notes (Signed)
GYNECOLOGY  VISIT   HPI: 54 y.o.   Divorced White or Caucasian Not Hispanic or Latino  female   [phone] with Patient's last menstrual period was 01/03/2000.   here for patient is having lower left abdomen pain She says that it is painful when having sex and when not having sex. She states that the pain has been present for about 4-5 days. She has noticed some lower back pain as well.   The patient has had a hysterectomy. In 1/21 her FSH was 57.7. She had an ultrasound in 2/21 after ovarian cysts were incidentally noted on CT exam. U/S showed several simple follicles bilaterally, the largest was 13.6 mm. She was felt to likely be peri and not postmenopausal.   She c/o a 4 day h/o LLQ pain. The pain is constant dull ache, intermittently more intense. Pain ranges from a 2-5/10 in severity. Pain was worsened with intercourse.   She has been constipated, took stool softener, felt she emptied out this morning and pain is still present.  A few days ago she had mild nausea, no emesis. Decreased appetite. No fevers. She c/o fatigue in the last several days, just doesn't feel well.  No bladder c/o. No increase in gas.  She c/o a "little cyst" on the outside of the labia. It has been present for several months, just recently it became uncomfortable.   GYNECOLOGIC HISTORY: Patient's last menstrual period was 01/03/2000. Contraception: NA Menopausal hormone therapy: na        OB History    Gravida  5   Para  3   Term  1   Preterm  2   AB  2   Living  3     SAB      TAB  1   Ectopic      Multiple      Live Births  3              Patient Active Problem List   Diagnosis Date Noted  . Chronic constipation 12/06/2018  . Status post arthroscopy of left shoulder 08/27/2018  . Rotator cuff tendonitis, left 01/25/2017  . Generalized anxiety disorder 12/29/2014  . Chronic left shoulder pain 07/11/2013  . Insomnia 04/11/2013  . Atypical chest pain 01/17/2013  . Dyspnea 01/03/2013   . Esophageal reflux 05/30/2012  . Depression 05/30/2012  . Chronic cough 05/30/2012    Past Medical History:  Diagnosis Date  . Abnormal Pap smear of cervix    over 30 yrs ago  . Anxiety   . Chronic joint pain   . Depression   . IBS (irritable bowel syndrome)   . Reflux     Past Surgical History:  Procedure Laterality Date  . ABDOMINAL HYSTERECTOMY  2002   TAH- Fibroids , endometriosis; ov retained  . BACK SURGERY  2005   L5-S1 Fusion  . CESAREAN SECTION  1987  . ESOPHAGOGASTRODUODENOSCOPY    . LAPAROSCOPIC CHOLECYSTECTOMY  1995  . MASTOPEXY  2012  . REDUCTION MAMMAPLASTY Bilateral   . SHOULDER SURGERY    . TONSILLECTOMY    . TUBAL LIGATION  1993   BTSP    Current Outpatient Medications  Medication Sig Dispense Refill  . APPLE CIDER VINEGAR PO Take by mouth.    . ASHWAGANDHA PO Take by mouth.    . clonazePAM (KLONOPIN) 1 MG tablet Take 1 tablet (1 mg total) by mouth daily as needed. 30 tablet 5  . gabapentin (NEURONTIN) 300 MG capsule Take 1 capsule (  300 mg total) by mouth at bedtime. 90 capsule 2  . Multiple Vitamin (MULTIVITAMIN) tablet Take 1 tablet by mouth daily.    . NONFORMULARY OR COMPOUNDED ITEM 0.5% Testosterone cream. Apply 0.5 grams daily to the lower abdomen daily. Dispense 15 grams. No refills. 1 each 0  . Probiotic Product (PROBIOTIC PO) Take by mouth.    . traZODone (DESYREL) 50 MG tablet Take 1-2 tablets (50-100 mg total) by mouth at bedtime as needed for sleep. 90 tablet 0   No current facility-administered medications for this visit.     ALLERGIES: Effexor [venlafaxine hcl], Sulfa antibiotics, and Zoloft [sertraline hcl]  Family History  Problem Relation Age of Onset  . Hypertension Mother   . Diabetes Mother        Type 2  . Thyroid disease Mother   . Glaucoma Mother   . Hypertension Father   . Atrial fibrillation Father   . Cancer Father        bladder & liver  . Heart disease Father   . Crohn's disease Father   . Heart attack  Maternal Grandfather   . Diabetes Maternal Grandmother        Type 2  . Hypertension Maternal Grandmother   . Heart disease Maternal Grandmother   . Heart disease Paternal Grandmother   . CAD Maternal Uncle   . Hypertension Brother   . Melanoma Brother   . Heart murmur Brother   . Other Brother        leaky aortic valve  . Breast cancer Neg Hx     Social History   Socioeconomic History  . Marital status: Divorced    Spouse name: Not on file  . Number of children: Not on file  . Years of education: Not on file  . Highest education level: Not on file  Occupational History  . Not on file  Tobacco Use  . Smoking status: Former Games developer  . Smokeless tobacco: Never Used  Vaping Use  . Vaping Use: Never used  Substance and Sexual Activity  . Alcohol use: Yes    Alcohol/week: 0.0 - 3.0 standard drinks  . Drug use: No  . Sexual activity: Yes    Partners: Male    Birth control/protection: Surgical    Comment: TAH  Other Topics Concern  . Not on file  Social History Narrative  . Not on file   Social Determinants of Health   Financial Resource Strain:   . Difficulty of Paying Living Expenses: Not on file  Food Insecurity:   . Worried About Programme researcher, broadcasting/film/video in the Last Year: Not on file  . Ran Out of Food in the Last Year: Not on file  Transportation Needs:   . Lack of Transportation (Medical): Not on file  . Lack of Transportation (Non-Medical): Not on file  Physical Activity:   . Days of Exercise per Week: Not on file  . Minutes of Exercise per Session: Not on file  Stress:   . Feeling of Stress : Not on file  Social Connections:   . Frequency of Communication with Friends and Family: Not on file  . Frequency of Social Gatherings with Friends and Family: Not on file  . Attends Religious Services: Not on file  . Active Member of Clubs or Organizations: Not on file  . Attends Banker Meetings: Not on file  . Marital Status: Not on file  Intimate  Partner Violence:   . Fear of Current or Ex-Partner: Not  on file  . Emotionally Abused: Not on file  . Physically Abused: Not on file  . Sexually Abused: Not on file    Review of Systems  Gastrointestinal: Positive for abdominal pain and constipation.  Musculoskeletal: Positive for back pain.    PHYSICAL EXAMINATION:    BP 120/64   Pulse 94   Ht 5\' 3"  (1.6 m)   Wt 146 lb (66.2 kg)   LMP 01/03/2000   SpO2 99%   BMI 25.86 kg/m     General appearance: alert, cooperative and appears stated age Abdomen: soft, mildly tender in the lateral LLQ, no rebound, no guarding,  non distended, no masses,  no organomegaly  Pelvic: External genitalia:  Small pimple on right vulva              Urethra:  normal appearing urethra with no masses, tenderness or lesions              Bartholins and Skenes: normal                 Vagina: normal appearing vagina with normal color and discharge, no lesions              Cervix: absent              Bimanual Exam:  Uterus:  uterus absent              Adnexa: no masses or tenderness              Rectovaginal: tender fullness in the left adnexa (unsure if bowel or ovary)              Anus:  normal sphincter tone, no lesions  Chaperone was present for exam.  ASSESSMENT LLQ abdominal/pelvic pain Fullness in left adnexa on exam (not sure if stool or ovary) Malaise     PLAN CBC with diff Ultrasound ordered Will take over the counter ibuprofen   Check temp

## 2019-09-03 NOTE — Telephone Encounter (Signed)
Pt sent following mychart message:   [name] "[name]"  P [facility] Good morning.  I have been having some pain in my lower left abdomen for the past few days. It is with intercourse and other times as well. I know I have a cyst on my ovaries and I'm concerned that it may be the cause of my pain.   Thank you,   Angel Silva

## 2019-09-03 NOTE — Progress Notes (Signed)
Spoke with [facility] scheduling. Patient scheduled while in office for PUS at [facility] on 09/04/19 at 1:30pm, arrive at 1:15pm. Arrive with full bladder. Patient is agreeable to date and time. Address provided.

## 2019-09-03 NOTE — Telephone Encounter (Signed)
AEX 12/2018 H/o bilateral ovarian cyst from PUS 02/2019 Menopausal, on gabapentin   Spoke with pt. Pt states having worsening left lower abd pain for last 4-5 days, especially with intercourse. Pt rates pain as 4-5 on pain scale. Only taking intermittent Ibuprofen. Denies vaginal bleeding, discharge, UTI sx, fever, chills, NVD.  Pt states only having constipation and took some stool softeners and had normal BM this morning.   Pt advised to have OV for further evaluation. Pt agreeable. Pt scheduled as work-in today with Dr Oscar La 09/03/19 at 1115 am. Pt verbalized understanding to date and time of appt. Time ok per Dr Oscar La.   Encounter closed.

## 2019-09-04 ENCOUNTER — Ambulatory Visit (HOSPITAL_COMMUNITY)
Admission: RE | Admit: 2019-09-04 | Discharge: 2019-09-04 | Disposition: A | Discharge status: Home / Self Care | Payer: BC Managed Care – PPO | Source: Ambulatory Visit | Attending: Obstetrics and Gynecology | Admitting: Obstetrics and Gynecology

## 2019-09-04 DIAGNOSIS — R5381 Other malaise: Secondary | ICD-10-CM | POA: Insufficient documentation

## 2019-09-04 DIAGNOSIS — R109 Unspecified abdominal pain: Secondary | ICD-10-CM | POA: Diagnosis present

## 2019-09-04 DIAGNOSIS — R102 Pelvic and perineal pain: Secondary | ICD-10-CM | POA: Insufficient documentation

## 2019-09-04 LAB — CBC WITH DIFFERENTIAL/PLATELET
Basophils Absolute: 0 10*3/uL (ref 0.0–0.2)
Basos: 0 %
EOS (ABSOLUTE): 0 10*3/uL (ref 0.0–0.4)
Eos: 1 %
Hematocrit: 41.8 % (ref 34.0–46.6)
Hemoglobin: 14.6 g/dL (ref 11.1–15.9)
Immature Grans (Abs): 0 10*3/uL (ref 0.0–0.1)
Immature Granulocytes: 0 %
Lymphocytes Absolute: 1.1 10*3/uL (ref 0.7–3.1)
Lymphs: 20 %
MCH: 32.2 pg (ref 26.6–33.0)
MCHC: 34.9 g/dL (ref 31.5–35.7)
MCV: 92 fL (ref 79–97)
Monocytes Absolute: 0.4 10*3/uL (ref 0.1–0.9)
Monocytes: 7 %
Neutrophils Absolute: 3.8 10*3/uL (ref 1.4–7.0)
Neutrophils: 72 %
Platelets: 265 10*3/uL (ref 150–450)
RBC: 4.54 x10E6/uL (ref 3.77–5.28)
RDW: 12.5 % (ref 11.7–15.4)
WBC: 5.4 10*3/uL (ref 3.4–10.8)

## 2019-09-16 ENCOUNTER — Encounter: Payer: Self-pay | Admitting: Obstetrics and Gynecology

## 2019-09-17 ENCOUNTER — Telehealth: Payer: Self-pay | Admitting: Obstetrics and Gynecology

## 2019-09-17 NOTE — Telephone Encounter (Signed)
Patient sent the following message via MyChart.  I suddenly developed symptoms of a UTI. Frequent/urgent need to urinate. Pain and burning when I do urinate. Would it be possible to call in a prescription for me?  Thank you,  Angel Silva

## 2019-09-17 NOTE — Telephone Encounter (Signed)
Pt sent mychart message.  Spoke with pt. Pt states having urinary frequency, urgency, burning pain and pressure since yesterday at work. Pt states feeling better this morning. States took OTC AZO and 1 tablet of 875 mg of Amoxicillin from fiance. Denies fever, chills, back pain. Pt advised to increase water intake, can take Ibuprofen or Tylenol, do not take anymore AZO for urine testing. Pt verbalized understanding and is agreeable.   Advised pt that we do not treat over the phone and would need OV for further evaluation. Pt agreeable.  Pt scheduled with Dr Oscar La as work-in 9/16 at 915 am. Pt declines earlier appt offered due to other appts.  Encounter closed.

## 2019-09-18 ENCOUNTER — Encounter: Payer: Self-pay | Admitting: Obstetrics and Gynecology

## 2019-09-18 ENCOUNTER — Ambulatory Visit: Payer: BC Managed Care – PPO | Admitting: Obstetrics and Gynecology

## 2019-09-18 ENCOUNTER — Other Ambulatory Visit: Payer: Self-pay

## 2019-09-18 VITALS — BP 122/64 | HR 64 | Temp 98.5°F | Ht 63.0 in | Wt 148.0 lb

## 2019-09-18 DIAGNOSIS — R3915 Urgency of urination: Secondary | ICD-10-CM

## 2019-09-18 DIAGNOSIS — N3090 Cystitis, unspecified without hematuria: Secondary | ICD-10-CM

## 2019-09-18 LAB — POCT URINALYSIS DIPSTICK
Bilirubin, UA: NEGATIVE
Blood, UA: NEGATIVE
Glucose, UA: NEGATIVE
Ketones, UA: NEGATIVE
Leukocytes, UA: NEGATIVE
Nitrite, UA: POSITIVE
Protein, UA: POSITIVE — AB
Urobilinogen, UA: 2 E.U./dL — AB
pH, UA: 5 (ref 5.0–8.0)

## 2019-09-18 MED ORDER — PHENAZOPYRIDINE HCL 200 MG PO TABS: 200.0000 mg | ORAL_TABLET | Freq: Three times a day (TID) | ORAL | 0 refills | Status: DC | PRN

## 2019-09-18 MED ORDER — NITROFURANTOIN MONOHYD MACRO 100 MG PO CAPS: 100.0000 mg | ORAL_CAPSULE | Freq: Two times a day (BID) | ORAL | 0 refills | Status: DC

## 2019-09-18 NOTE — Patient Instructions (Signed)

## 2019-09-18 NOTE — Progress Notes (Signed)
GYNECOLOGY  VISIT   HPI: 54 y.o.   Divorced White or Caucasian Not Hispanic or Latino  female   [phone] with Angel Silva's last menstrual period was 01/03/2000.   here for urinary frequency, urgency, burning pain and pressure. She states that it started on Tuesday.  She took one Amoxicillin Tuesday night. The last time she took Azo was yesterday. She states that today she has no pain or burning. She is still experiencing the frequency. She is voiding normal amounts, still with frequency and some urgency. No dysuria. No fever, no flank pain.   GYNECOLOGIC HISTORY: Angel Silva's last menstrual period was 01/03/2000. Contraception:Hysterectomy  Menopausal hormone therapy: testosterone cream         OB History    Gravida  5   Para  3   Term  1   Preterm  2   AB  2   Living  3     SAB      TAB  1   Ectopic      Multiple      Live Births  3              Angel Silva Active Problem List   Diagnosis Date Noted  . Chronic constipation 12/06/2018  . Status post arthroscopy of left shoulder 08/27/2018  . Biceps tendinitis of right shoulder 08/20/2018  . Right shoulder pain 08/20/2018  . Rotator cuff tendonitis, left 01/25/2017  . Generalized anxiety disorder 12/29/2014  . Chronic left shoulder pain 07/11/2013  . Insomnia 04/11/2013  . Atypical chest pain 01/17/2013  . Dyspnea 01/03/2013  . Esophageal reflux 05/30/2012  . Depression 05/30/2012  . Chronic cough 05/30/2012    Past Medical History:  Diagnosis Date  . Abnormal Pap smear of cervix    over 30 yrs ago  . Anxiety   . Chronic joint pain   . Depression   . IBS (irritable bowel syndrome)   . Reflux     Past Surgical History:  Procedure Laterality Date  . ABDOMINAL HYSTERECTOMY  2002   TAH- Fibroids , endometriosis; ov retained  . BACK SURGERY  2005   L5-S1 Fusion  . CESAREAN SECTION  1987  . ESOPHAGOGASTRODUODENOSCOPY    . LAPAROSCOPIC CHOLECYSTECTOMY  1995  . MASTOPEXY  2012  . REDUCTION MAMMAPLASTY  Bilateral   . SHOULDER SURGERY    . TONSILLECTOMY    . TUBAL LIGATION  1993   BTSP    Current Outpatient Medications  Medication Sig Dispense Refill  . APPLE CIDER VINEGAR PO Take by mouth.    . ASHWAGANDHA PO Take by mouth.    . clonazePAM (KLONOPIN) 1 MG tablet Take 1 tablet (1 mg total) by mouth daily as needed. 30 tablet 5  . gabapentin (NEURONTIN) 300 MG capsule Take 1 capsule (300 mg total) by mouth at bedtime. 90 capsule 2  . Multiple Vitamin (MULTIVITAMIN) tablet Take 1 tablet by mouth daily.    . NONFORMULARY OR COMPOUNDED ITEM 0.5% Testosterone cream. Apply 0.5 grams daily to the lower abdomen daily. Dispense 15 grams. No refills. 1 each 0  . Probiotic Product (PROBIOTIC PO) Take by mouth.    . traZODone (DESYREL) 50 MG tablet Take 1-2 tablets (50-100 mg total) by mouth at bedtime as needed for sleep. 90 tablet 0   No current facility-administered medications for this visit.     ALLERGIES: Effexor [venlafaxine hcl], Sulfa antibiotics, and Zoloft [sertraline hcl]  Family History  Problem Relation Age of Onset  . Hypertension Mother   .  Diabetes Mother        Type 2  . Thyroid disease Mother   . Glaucoma Mother   . Hypertension Father   . Atrial fibrillation Father   . Cancer Father        bladder & liver  . Heart disease Father   . Crohn's disease Father   . Heart attack Maternal Grandfather   . Diabetes Maternal Grandmother        Type 2  . Hypertension Maternal Grandmother   . Heart disease Maternal Grandmother   . Heart disease Paternal Grandmother   . CAD Maternal Uncle   . Hypertension Brother   . Melanoma Brother   . Heart murmur Brother   . Other Brother        leaky aortic valve  . Breast cancer Neg Hx     Social History   Socioeconomic History  . Marital status: Divorced    Spouse name: Not on file  . Number of children: Not on file  . Years of education: Not on file  . Highest education level: Not on file  Occupational History  . Not on  file  Tobacco Use  . Smoking status: Former Games developer  . Smokeless tobacco: Never Used  Vaping Use  . Vaping Use: Never used  Substance and Sexual Activity  . Alcohol use: Yes    Alcohol/week: 0.0 - 3.0 standard drinks  . Drug use: No  . Sexual activity: Yes    Partners: Male    Birth control/protection: Surgical    Comment: TAH  Other Topics Concern  . Not on file  Social History Narrative  . Not on file   Social Determinants of Health   Financial Resource Strain:   . Difficulty of Paying Living Expenses: Not on file  Food Insecurity:   . Worried About Programme researcher, broadcasting/film/video in the Last Year: Not on file  . Ran Out of Food in the Last Year: Not on file  Transportation Needs:   . Lack of Transportation (Medical): Not on file  . Lack of Transportation (Non-Medical): Not on file  Physical Activity:   . Days of Exercise per Week: Not on file  . Minutes of Exercise per Session: Not on file  Stress:   . Feeling of Stress : Not on file  Social Connections:   . Frequency of Communication with Friends and Family: Not on file  . Frequency of Social Gatherings with Friends and Family: Not on file  . Attends Religious Services: Not on file  . Active Member of Clubs or Organizations: Not on file  . Attends Banker Meetings: Not on file  . Marital Status: Not on file  Intimate Partner Violence:   . Fear of Current or Ex-Partner: Not on file  . Emotionally Abused: Not on file  . Physically Abused: Not on file  . Sexually Abused: Not on file    Review of Systems  Genitourinary: Positive for dysuria, flank pain, frequency and urgency.  All other systems reviewed and are negative.   PHYSICAL EXAMINATION:    BP 122/64   Pulse 64   Temp 98.5 F (36.9 C)   Ht 5\' 3"  (1.6 m)   Wt 148 lb (67.1 kg)   LMP 01/03/2000   SpO2 99%   BMI 26.22 kg/m     General appearance: alert, cooperative and appears stated age Abdomen: soft, mildly tender in the SP region; non distended,  no masses,  no organomegaly CVA: Not tender  Urine dip:+nitrate, negative leuk, negative blood  ASSESSMENT Cystitis, may be partially treated    PLAN macrobid and pyridium Call if not better in 48 hours Urine for ua, c&s

## 2019-09-19 LAB — URINALYSIS, MICROSCOPIC ONLY
Bacteria, UA: NONE SEEN
Casts: NONE SEEN /lpf
RBC, Urine: NONE SEEN /hpf (ref 0–2)
WBC, UA: NONE SEEN /hpf (ref 0–5)

## 2019-09-20 LAB — URINE CULTURE

## 2019-10-12 ENCOUNTER — Other Ambulatory Visit: Payer: Self-pay | Admitting: Family Medicine

## 2019-11-24 ENCOUNTER — Encounter: Payer: Self-pay | Admitting: Obstetrics and Gynecology

## 2019-11-24 ENCOUNTER — Telehealth: Payer: Self-pay

## 2019-11-24 DIAGNOSIS — R6882 Decreased libido: Secondary | ICD-10-CM

## 2019-11-24 NOTE — Telephone Encounter (Signed)
Pt sent the following mychart message:  Angel Silva "[name]"  P [facility] Good morning,   I would like to start the testosterone cream again. I know I would probably need blood work before it was prescribed so Id like to schedule that if possible.   Thank you,   Lamona Curl

## 2019-11-24 NOTE — Telephone Encounter (Signed)
AEX 12/2018, next not scheduled  H/o low libido Last Rx for Testosterone cream 0.5% given 05/2019 Last testosterone levels 05/05/19- 25.2  Routing to Dr Oscar La. Please advise   Does pt need new labs drawn prior to Rx refill?

## 2019-11-24 NOTE — Telephone Encounter (Signed)
Spoke with pt. Pt given recommendations per Dr Oscar La. Pt agreeable to come for baseline testosterone level. Pt scheduled on 11/29 at 1015 am. Pt verbalized understanding and is aware of Rx after results.  Encounter closed  Lab order placed by Dr Oscar La

## 2019-11-24 NOTE — Telephone Encounter (Signed)
Yes she should have a baseline testosterone panel. Then f/u one month after starting treatment. Future lab ordered.

## 2019-12-01 ENCOUNTER — Other Ambulatory Visit: Payer: Self-pay

## 2019-12-01 ENCOUNTER — Other Ambulatory Visit (INDEPENDENT_AMBULATORY_CARE_PROVIDER_SITE_OTHER): Payer: BC Managed Care – PPO

## 2019-12-01 DIAGNOSIS — R6882 Decreased libido: Secondary | ICD-10-CM

## 2019-12-03 LAB — TESTT+TESTF+SHBG
Sex Hormone Binding: 80 nmol/L (ref 17.3–125.0)
Testosterone, Free: 1.9 pg/mL (ref 0.0–4.2)
Testosterone, Total, LC/MS: 25 ng/dL

## 2019-12-05 ENCOUNTER — Telehealth: Payer: Self-pay

## 2019-12-05 MED ORDER — NONFORMULARY OR COMPOUNDED ITEM
0 refills | Status: DC
Start: 2019-12-05 — End: 2020-05-26

## 2019-12-05 NOTE — Telephone Encounter (Signed)
Spoke with pt. Pt given results and recommendations per Dr Oscar La. Pt agreeable to date and time of appt. Pt agreeable to Rx and repeat lab.  Lab scheduled for 01/12/2020 at 115 pm per pt's request. Pt agreeable to date and time of appt.  Rx printed, signed and faxed today to [facility].  Encounter closed

## 2019-12-05 NOTE — Telephone Encounter (Signed)
-----   Message from Romualdo Bolk, MD sent at 12/05/2019  1:05 PM EST ----- Please send a script for 0.5% testosterone cream, apply 0.5 grams daily to her lower abdomen. #0 grams, no refills. She needs a repeat testosterone panel in one month.

## 2019-12-22 ENCOUNTER — Ambulatory Visit: Payer: BC Managed Care – PPO | Admitting: Family Medicine

## 2020-01-07 ENCOUNTER — Other Ambulatory Visit: Payer: Self-pay | Admitting: Family Medicine

## 2020-01-07 MED ORDER — TRAZODONE HCL 50 MG PO TABS
ORAL_TABLET | ORAL | 0 refills | Status: DC
Start: 2020-01-07 — End: 2020-03-24

## 2020-01-07 NOTE — Telephone Encounter (Signed)
Patient is canceling appointment for medication follow up on 1/11 due to Covid exposure. She is trying to get Covid test today somewhere. But needing refill on trazodone 50 mg until she can get back in to be seen. [facility]

## 2020-01-12 ENCOUNTER — Other Ambulatory Visit: Payer: Self-pay

## 2020-01-13 ENCOUNTER — Ambulatory Visit: Payer: BC Managed Care – PPO | Admitting: Family Medicine

## 2020-02-26 ENCOUNTER — Other Ambulatory Visit: Payer: Self-pay

## 2020-02-26 MED ORDER — GABAPENTIN 300 MG PO CAPS
300.0000 mg | ORAL_CAPSULE | Freq: Every day | ORAL | 0 refills | Status: DC
Start: 2020-02-26 — End: 2020-05-26

## 2020-02-26 NOTE — Telephone Encounter (Signed)
Refill sent. She is overdue for her annual exam. Please have her schedule prior to her next refill.

## 2020-02-26 NOTE — Telephone Encounter (Signed)
Patient was prescribed Gabapentin 300 mg capsule #90 x 2 refills on 05/02/19.  It appears last AEX was 12/13/18.  She called today stating that she picked up a refill last week but cannot find it.  She is calling to see if you will approve a refill.

## 2020-02-27 NOTE — Telephone Encounter (Signed)
Left detailed message on cell per DPR access to schedule annual exam

## 2020-03-15 ENCOUNTER — Other Ambulatory Visit: Payer: Self-pay

## 2020-03-15 DIAGNOSIS — R6882 Decreased libido: Secondary | ICD-10-CM

## 2020-03-15 NOTE — Telephone Encounter (Signed)
AEX is scheduled 05/26/20.  Last AEX 12/13/2018.  Patient is requesting refill on Testosterone Cream. She said it will run out before her scheduled annual exam.

## 2020-03-15 NOTE — Telephone Encounter (Signed)
She needs a repeat testosterone panel. Order placed

## 2020-03-24 ENCOUNTER — Telehealth (INDEPENDENT_AMBULATORY_CARE_PROVIDER_SITE_OTHER): Payer: BC Managed Care – PPO | Admitting: Family Medicine

## 2020-03-24 ENCOUNTER — Telehealth: Payer: Self-pay | Admitting: *Deleted

## 2020-03-24 DIAGNOSIS — N951 Menopausal and female climacteric states: Secondary | ICD-10-CM | POA: Diagnosis not present

## 2020-03-24 DIAGNOSIS — F5101 Primary insomnia: Secondary | ICD-10-CM

## 2020-03-24 MED ORDER — TRAZODONE HCL 50 MG PO TABS
ORAL_TABLET | ORAL | 1 refills | Status: DC
Start: 2020-03-24 — End: 2020-09-21

## 2020-03-24 NOTE — Telephone Encounter (Signed)
Ms. arriyana, rodell are scheduled for a virtual visit with your provider today.    Just as we do with appointments in the office, we must obtain your consent to participate.  Your consent will be active for this visit and any virtual visit you may have with one of our providers in the next 365 days.    If you have a MyChart account, I can also send a copy of this consent to you electronically.  All virtual visits are billed to your insurance company just like a traditional visit in the office.  As this is a virtual visit, video technology does not allow for your provider to perform a traditional examination.  This may limit your provider's ability to fully assess your condition.  If your provider identifies any concerns that need to be evaluated in person or the need to arrange testing such as labs, EKG, etc, we will make arrangements to do so.    Although advances in technology are sophisticated, we cannot ensure that it will always work on either your end or our end.  If the connection with a video visit is poor, we may have to switch to a telephone visit.  With either a video or telephone visit, we are not always able to ensure that we have a secure connection.   I need to obtain your verbal consent now.   Are you willing to proceed with your visit today?   Saralyn Willison has provided verbal consent on 03/24/2020 for a virtual visit (video or telephone).

## 2020-03-24 NOTE — Progress Notes (Signed)
Patient ID: Angel Silva, female    DOB: 02-Dec-1965, 55 y.o.   MRN: [id number]   Virtual Visit via Telephone Note  I connected with Angel Silva on 03/24/20 at  2:50 PM EDT by telephone and verified that I am speaking with the correct person using two identifiers.  Location: Patient: home Provider: office   I discussed the limitations, risks, security and privacy concerns of performing an evaluation and management service by telephone and the availability of in person appointments. I also discussed with the patient that there may be a patient responsible charge related to this service. The patient expressed understanding and agreed to proceed.   Chief Complaint  Patient presents with  . Insomnia    Follow up- some nights sleeps good- some night she doesn't- has stopped the klonopin   Subjective:    HPI  F/u insomnia-  Pt has stopped the clonazepam for sleep.  Taking the trazodone still.  Helps some but does have some nights not sleeping well.  Hard to fall asleep.  Waking up a lot at night. Never getting full night sleep.  Start with 1 trazodone 50mg , and if waking up will take the 2nd one. Pt wanting get off sleep meds all together in future.  Taking gabapentin for hot flashes.  Taking 300mg  at night. Has appt in may.  Hot flashes- Not feeling lots of sweating.  Feeling her blood is boiling feeling and skin is cold to touch. Was on effexor years ago. Pt didn't like how she felt on it.  Medical History Angel Silva has a past medical history of Abnormal Pap smear of cervix, Anxiety, Chronic joint pain, Depression, IBS (irritable bowel syndrome), and Reflux.   Outpatient Encounter Medications as of 03/24/2020  Medication Sig  . APPLE CIDER VINEGAR PO Take by mouth.  . ASHWAGANDHA PO Take by mouth.  . clonazePAM (KLONOPIN) 1 MG tablet Take 1 tablet (1 mg total) by mouth daily as needed. (Patient not taking: Reported on 03/24/2020)  . gabapentin (NEURONTIN) 300 MG  capsule Take 1 capsule (300 mg total) by mouth at bedtime.  . Multiple Vitamin (MULTIVITAMIN) tablet Take 1 tablet by mouth daily.  . nitrofurantoin, macrocrystal-monohydrate, (MACROBID) 100 MG capsule Take 1 capsule (100 mg total) by mouth 2 (two) times daily. (Patient not taking: Reported on 03/24/2020)  . NONFORMULARY OR COMPOUNDED ITEM 0.5% Testosterone cream. Apply 0.5 grams daily to the lower abdomen daily. Dispense 15 grams. No refills.  . phenazopyridine (PYRIDIUM) 200 MG tablet Take 1 tablet (200 mg total) by mouth 3 (three) times daily as needed. (Patient not taking: Reported on 03/24/2020)  . Probiotic Product (PROBIOTIC PO) Take by mouth.  . traZODone (DESYREL) 50 MG tablet TAKE 1 TO 2 TABLETS (50 MG TO 100 MG TOTAL) AT BEDTIME AS NEEDED FOR SLEEP  . [DISCONTINUED] traZODone (DESYREL) 50 MG tablet TAKE 1 TO 2 TABLETS (50 MG TO 100 MG TOTAL) AT BEDTIME AS NEEDED FOR SLEEP   No facility-administered encounter medications on file as of 03/24/2020.     Review of Systems  Constitutional: Negative for chills and fever.  HENT: Negative for congestion, rhinorrhea and sore throat.   Respiratory: Negative for cough, shortness of breath and wheezing.   Cardiovascular: Negative for chest pain and leg swelling.  Gastrointestinal: Negative for abdominal pain, diarrhea, nausea and vomiting.  Genitourinary: Negative for dysuria and frequency.       +hot flashes.  Musculoskeletal: Negative for arthralgias and back pain.  Skin: Negative for rash.  Neurological:  Negative for dizziness, weakness and headaches.  Psychiatric/Behavioral: Positive for sleep disturbance. Negative for dysphoric mood, self-injury and suicidal ideas. The patient is not nervous/anxious.      Vitals LMP 01/03/2000   Objective:   Physical Exam  No PE due to phone visit.  Assessment and Plan   1. Primary insomnia  2. Vasomotor symptoms due to menopause   Insomnia- suboptimal.  Offered pt to go up to 150mg  qhs  trazodone 1 hr prior to bed, however, pt wanting to stick with her current regimen. Pt wanting to stay with the trazodone 1-2 tab qhs 50mg .  Not wanting to increase at this time.  Pt weaned off the klonapin.  Hot flashes- pt is seeing gyn and taking gabapentin.  Pt has had effexor in past and didn't want to cont with this.  Pt stating will f/u with gyn for possible increase in gabapentin.   Return in about 6 months (around 09/24/2020) for f/u insomnia.   Follow Up Instructions:    I discussed the assessment and treatment plan with the patient. The patient was provided an opportunity to ask questions and all were answered. The patient agreed with the plan and demonstrated an understanding of the instructions.   The patient was advised to call back or seek an in-person evaluation if the symptoms worsen or if the condition fails to improve as anticipated.  I provided 15 minutes of non-face-to-face time during this encounter.

## 2020-05-25 NOTE — Progress Notes (Signed)
55 y.o. [id number] Divorced White or Caucasian Not Hispanic or Latino female here for annual exam.  H/o hysterectomy.  On gabapentin at night for vasomotor symptoms. Sleeping better, but not great. Usually up 1 x a night, can't always fall back asleep. Not having vasomotor symptoms at night.  She is having more hot flashes, can get drenched in sweat, having 0-2 hot flashes a day. Overall tolerable.   Same partner for 2.5 years, very happy. No dyspareunia.   She had small ovarian cysts on u/s in 2/21. F/U imaging in 9/21 was normal.   No bowel or bladder issues.     Patient's last menstrual period was 01/03/2000.          Sexually active: Yes.    The current method of family planning is status post hysterectomy.    Exercising: Yes.    weight training  Smoker:  no  Health Maintenance: Pap:  2012 ( hysterectomy)  History of abnormal Pap:  No MMG:  11/07/18 density C Bi-rads 2 benign  BMD:   Unsure  Colonoscopy:7/20, was told she needed f/u in 1 year  (inadequate prep)  TDaP:  2014 Gardasil: n/a   reports that she has quit smoking. She has never used smokeless tobacco. She reports current alcohol use. She reports that she does not use drugs. Just occasional ETOH. She works in Database administrator. Lives with her boyfriend. 3 grown kids. She has 5 grandchildren, 6th is due in July. All local.   Past Medical History:  Diagnosis Date  . Anxiety   . Chronic joint pain   . Depression   . IBS (irritable bowel syndrome)   . Reflux     Past Surgical History:  Procedure Laterality Date  . ABDOMINAL HYSTERECTOMY  2002   TAH- Fibroids , endometriosis; ov retained  . BACK SURGERY  2005   L5-S1 Fusion  . CESAREAN SECTION  1987  . ESOPHAGOGASTRODUODENOSCOPY    . LAPAROSCOPIC CHOLECYSTECTOMY  1995  . MASTOPEXY  2012  . REDUCTION MAMMAPLASTY Bilateral   . SHOULDER SURGERY    . TONSILLECTOMY    . TUBAL LIGATION  1993   BTSP    Current Outpatient Medications  Medication Sig Dispense  Refill  . Cyanocobalamin (B-12 PO) Take by mouth.    . gabapentin (NEURONTIN) 100 MG capsule Take one tablet po qhs (add to the 300 mg tablet), if tolerating after one week can increase to 2 tablets po qhs (with the 300 mg tablet), after another week to 3 tablets qhs (with the 300 mg tablet) 90 capsule 0  . meloxicam (MOBIC) 7.5 MG tablet Take by mouth.    . Multiple Vitamin (MULTIVITAMIN) tablet Take 1 tablet by mouth daily.    . traZODone (DESYREL) 50 MG tablet TAKE 1 TO 2 TABLETS (50 MG TO 100 MG TOTAL) AT BEDTIME AS NEEDED FOR SLEEP 135 tablet 1  . gabapentin (NEURONTIN) 300 MG capsule Take 1 capsule (300 mg total) by mouth at bedtime. 90 capsule 3   No current facility-administered medications for this visit.    Family History  Problem Relation Age of Onset  . Hypertension Mother   . Diabetes Mother        Type 2  . Thyroid disease Mother   . Glaucoma Mother   . Hypertension Father   . Atrial fibrillation Father   . Cancer Father        bladder & liver  . Heart disease Father   . Crohn's disease Father   .  Heart attack Maternal Grandfather   . Diabetes Maternal Grandmother        Type 2  . Hypertension Maternal Grandmother   . Heart disease Maternal Grandmother   . Heart disease Paternal Grandmother   . CAD Maternal Uncle   . Hypertension Brother   . Melanoma Brother   . Heart murmur Brother   . Other Brother        leaky aortic valve  . Breast cancer Neg Hx     Review of Systems  Exam:   BP 136/82   Pulse 68   Ht 5\' 3"  (1.6 m)   Wt 144 lb 6.4 oz (65.5 kg)   LMP 01/03/2000   SpO2 99%   BMI 25.58 kg/m   Weight change: @WEIGHTCHANGE @ Height:   Height: 5\' 3"  (160 cm)  Ht Readings from Last 3 Encounters:  05/26/20 5\' 3"  (1.6 m)  09/18/19 5\' 3"  (1.6 m)  09/03/19 5\' 3"  (1.6 m)    General appearance: alert, cooperative and appears stated age Head: Normocephalic, without obvious abnormality, atraumatic Neck: no adenopathy, supple, symmetrical, trachea midline  and thyroid normal to inspection and palpation Lungs: clear to auscultation bilaterally Cardiovascular: regular rate and rhythm Breasts: normal appearance, no masses or tenderness Abdomen: soft, non-tender; non distended,  no masses,  no organomegaly Extremities: extremities normal, atraumatic, no cyanosis or edema Skin: Skin color, texture, turgor normal. No rashes or lesions Lymph nodes: Cervical, supraclavicular, and axillary nodes normal. No abnormal inguinal nodes palpated Neurologic: Grossly normal   Pelvic: External genitalia:  no lesions              Urethra:  normal appearing urethra with no masses, tenderness or lesions              Bartholins and Skenes: normal                 Vagina: normal appearing vagina with normal color and discharge, no lesions              Cervix: absent               Bimanual Exam:  Uterus:  uterus absent              Adnexa: no mass, fullness, tenderness               Rectovaginal: Confirms               Anus:  normal sphincter tone, no lesions  05/28/20 chaperoned for the exam.  1. Well woman exam Mammogram overdue, she will schedule Colonoscopy overdue, she will schedule  2. Vasomotor symptoms due to menopause Will increase her night time dose, not sleeping great. So far daytime hot flashes are tolerable - gabapentin (NEURONTIN) 300 MG capsule; Take 1 capsule (300 mg total) by mouth at bedtime.  Dispense: 90 capsule; Refill: 3 - gabapentin (NEURONTIN) 100 MG capsule; Take one tablet po qhs (add to the 300 mg tablet), if tolerating after one week can increase to 2 tablets po qhs (with the 300 mg tablet), after another week to 3 tablets qhs (with the 300 mg tablet)  Dispense: 90 capsule; Refill: 0  3. Laboratory exam ordered as part of routine general medical examination Normal lipids last year - CBC - Comprehensive metabolic panel  4. Vitamin D deficiency - VITAMIN D 25 Hydroxy (Vit-D Deficiency, Fractures)

## 2020-05-26 ENCOUNTER — Ambulatory Visit (INDEPENDENT_AMBULATORY_CARE_PROVIDER_SITE_OTHER): Payer: BC Managed Care – PPO | Admitting: Obstetrics and Gynecology

## 2020-05-26 ENCOUNTER — Other Ambulatory Visit: Payer: Self-pay

## 2020-05-26 ENCOUNTER — Encounter: Payer: Self-pay | Admitting: Obstetrics and Gynecology

## 2020-05-26 VITALS — BP 136/82 | HR 68 | Ht 63.0 in | Wt 144.4 lb

## 2020-05-26 DIAGNOSIS — E559 Vitamin D deficiency, unspecified: Secondary | ICD-10-CM | POA: Diagnosis not present

## 2020-05-26 DIAGNOSIS — K625 Hemorrhage of anus and rectum: Secondary | ICD-10-CM | POA: Insufficient documentation

## 2020-05-26 DIAGNOSIS — Z1211 Encounter for screening for malignant neoplasm of colon: Secondary | ICD-10-CM | POA: Insufficient documentation

## 2020-05-26 DIAGNOSIS — N951 Menopausal and female climacteric states: Secondary | ICD-10-CM | POA: Diagnosis not present

## 2020-05-26 DIAGNOSIS — Z0000 Encounter for general adult medical examination without abnormal findings: Secondary | ICD-10-CM

## 2020-05-26 DIAGNOSIS — Z01419 Encounter for gynecological examination (general) (routine) without abnormal findings: Secondary | ICD-10-CM | POA: Diagnosis not present

## 2020-05-26 MED ORDER — GABAPENTIN 100 MG PO CAPS: ORAL_CAPSULE | ORAL | 0 refills | Status: DC

## 2020-05-26 MED ORDER — GABAPENTIN 300 MG PO CAPS: 300.0000 mg | ORAL_CAPSULE | Freq: Every day | ORAL | 3 refills | Status: DC

## 2020-05-26 NOTE — Patient Instructions (Signed)

## 2020-05-27 LAB — CBC
HCT: 45.4 % — ABNORMAL HIGH (ref 35.0–45.0)
Hemoglobin: 15 g/dL (ref 11.7–15.5)
MCH: 29.8 pg (ref 27.0–33.0)
MCHC: 33 g/dL (ref 32.0–36.0)
MCV: 90.3 fL (ref 80.0–100.0)
MPV: 11.7 fL (ref 7.5–12.5)
Platelets: 286 10*3/uL (ref 140–400)
RBC: 5.03 10*6/uL (ref 3.80–5.10)
RDW: 12 % (ref 11.0–15.0)
WBC: 6.8 10*3/uL (ref 3.8–10.8)

## 2020-05-27 LAB — COMPREHENSIVE METABOLIC PANEL
AG Ratio: 2 (calc) (ref 1.0–2.5)
ALT: 36 U/L — ABNORMAL HIGH (ref 6–29)
AST: 24 U/L (ref 10–35)
Albumin: 4.5 g/dL (ref 3.6–5.1)
Alkaline phosphatase (APISO): 101 U/L (ref 37–153)
BUN/Creatinine Ratio: 36 (calc) — ABNORMAL HIGH (ref 6–22)
BUN: 32 mg/dL — ABNORMAL HIGH (ref 7–25)
CO2: 26 mmol/L (ref 20–32)
Calcium: 9.9 mg/dL (ref 8.6–10.4)
Chloride: 104 mmol/L (ref 98–110)
Creat: 0.88 mg/dL (ref 0.50–1.05)
Globulin: 2.3 g/dL (calc) (ref 1.9–3.7)
Glucose, Bld: 94 mg/dL (ref 65–99)
Potassium: 4.5 mmol/L (ref 3.5–5.3)
Sodium: 138 mmol/L (ref 135–146)
Total Bilirubin: 0.6 mg/dL (ref 0.2–1.2)
Total Protein: 6.8 g/dL (ref 6.1–8.1)

## 2020-05-27 LAB — VITAMIN D 25 HYDROXY (VIT D DEFICIENCY, FRACTURES): Vit D, 25-Hydroxy: 68 ng/mL (ref 30–100)

## 2020-06-02 ENCOUNTER — Other Ambulatory Visit: Payer: Self-pay | Admitting: Obstetrics and Gynecology

## 2020-06-02 DIAGNOSIS — R7989 Other specified abnormal findings of blood chemistry: Secondary | ICD-10-CM

## 2020-06-07 ENCOUNTER — Encounter: Payer: Self-pay | Admitting: Obstetrics and Gynecology

## 2020-06-07 ENCOUNTER — Ambulatory Visit: Payer: BC Managed Care – PPO | Admitting: Obstetrics and Gynecology

## 2020-06-07 ENCOUNTER — Other Ambulatory Visit: Payer: Self-pay

## 2020-06-07 VITALS — BP 118/76 | HR 77

## 2020-06-07 DIAGNOSIS — R300 Dysuria: Secondary | ICD-10-CM | POA: Diagnosis not present

## 2020-06-07 DIAGNOSIS — N3000 Acute cystitis without hematuria: Secondary | ICD-10-CM

## 2020-06-07 MED ORDER — PHENAZOPYRIDINE HCL 200 MG PO TABS
200.0000 mg | ORAL_TABLET | Freq: Three times a day (TID) | ORAL | 0 refills | Status: DC | PRN
Start: 2020-06-07 — End: 2020-08-18

## 2020-06-07 MED ORDER — NITROFURANTOIN MONOHYD MACRO 100 MG PO CAPS: 100.0000 mg | ORAL_CAPSULE | Freq: Two times a day (BID) | ORAL | 0 refills | Status: DC

## 2020-06-07 NOTE — Progress Notes (Signed)
GYNECOLOGY  VISIT   HPI: 55 y.o.   Divorced [name]   [phone] with Patient's last menstrual period was 01/03/2000.   here for frequency and dysuria. No vaginal sx's.  Some urgency and frequency to void.  Pain and burning once with urination this am.   Denies hematuria.   No fever, shakes, or chills.  No nausea or vomiting.   Has low dull mild back pain.   Hx UTIs. E Coli UTI 09/18/19.  Used a new soap today.   No vaginal discharge or odor.   No partner change.   Has a lump on her right labia with a white head on it, but not today.  Can enlarge or sometimes is not noticeable.  No drainage.   Waxes her pubic hair.   GYNECOLOGIC HISTORY: Patient's last menstrual period was 01/03/2000. Contraception: status post hysterectomy. Menopausal hormone therapy:  none Last mammogram: 11/07/18 density C Bi-rads 2 benign Last pap smear:    2012 ( hysterectomy)         OB History    Gravida  5   Para  3   Term  1   Preterm  2   AB  2   Living  3     SAB      IAB  1   Ectopic      Multiple      Live Births  3              Patient Active Problem List   Diagnosis Date Noted  . Colon cancer screening 05/26/2020  . Rectal bleeding 05/26/2020  . Chronic constipation 12/06/2018  . Status post arthroscopy of left shoulder 08/27/2018  . Biceps tendinitis of right shoulder 08/20/2018  . Right shoulder pain 08/20/2018  . Rotator cuff tendonitis, left 01/25/2017  . Generalized anxiety disorder 12/29/2014  . Chronic left shoulder pain 07/11/2013  . Insomnia 04/11/2013  . Atypical chest pain 01/17/2013  . Dyspnea 01/03/2013  . Esophageal reflux 05/30/2012  . Depression 05/30/2012  . Chronic cough 05/30/2012    Past Medical History:  Diagnosis Date  . Anxiety   . C. difficile colitis   . Chronic joint pain   . Depression   . IBS (irritable bowel syndrome)   . Reflux     Past Surgical History:  Procedure Laterality Date  . ABDOMINAL  HYSTERECTOMY  2002   TAH- Fibroids , endometriosis; ov retained  . BACK SURGERY  2005   L5-S1 Fusion  . CESAREAN SECTION  1987  . ESOPHAGOGASTRODUODENOSCOPY    . LAPAROSCOPIC CHOLECYSTECTOMY  1995  . MASTOPEXY  2012  . REDUCTION MAMMAPLASTY Bilateral   . SHOULDER SURGERY    . TONSILLECTOMY    . TUBAL LIGATION  1993   BTSP    Current Outpatient Medications  Medication Sig Dispense Refill  . COLLAGEN PO Take by mouth.    . Cyanocobalamin (B-12 PO) Take by mouth.    . gabapentin (NEURONTIN) 100 MG capsule Take one tablet po qhs (add to the 300 mg tablet), if tolerating after one week can increase to 2 tablets po qhs (with the 300 mg tablet), after another week to 3 tablets qhs (with the 300 mg tablet) 90 capsule 0  . gabapentin (NEURONTIN) 300 MG capsule Take 1 capsule (300 mg total) by mouth at bedtime. 90 capsule 3  . glucosamine-chondroitin 500-400 MG tablet Take 1 tablet by mouth 3 (three) times daily.    . meloxicam (MOBIC) 7.5 MG tablet Take by  mouth.    . Multiple Vitamin (MULTIVITAMIN) tablet Take 1 tablet by mouth daily.    . nitrofurantoin, macrocrystal-monohydrate, (MACROBID) 100 MG capsule Take 1 capsule (100 mg total) by mouth 2 (two) times daily. Take for 5 days. 10 capsule 0  . phenazopyridine (PYRIDIUM) 200 MG tablet Take 1 tablet (200 mg total) by mouth 3 (three) times daily as needed for pain. 10 tablet 0  . traZODone (DESYREL) 50 MG tablet TAKE 1 TO 2 TABLETS (50 MG TO 100 MG TOTAL) AT BEDTIME AS NEEDED FOR SLEEP 135 tablet 1   No current facility-administered medications for this visit.     ALLERGIES: Effexor [venlafaxine hcl], Sulfa antibiotics, and Zoloft [sertraline hcl]  Family History  Problem Relation Age of Onset  . Hypertension Mother   . Diabetes Mother        Type 2  . Thyroid disease Mother   . Glaucoma Mother   . Hypertension Father   . Atrial fibrillation Father   . Cancer Father        bladder & liver  . Heart disease Father   . Crohn's  disease Father   . Heart attack Maternal Grandfather   . Diabetes Maternal Grandmother        Type 2  . Hypertension Maternal Grandmother   . Heart disease Maternal Grandmother   . Heart disease Paternal Grandmother   . CAD Maternal Uncle   . Hypertension Brother   . Melanoma Brother   . Heart murmur Brother   . Other Brother        leaky aortic valve  . Breast cancer Neg Hx     Social History   Socioeconomic History  . Marital status: Divorced    Spouse name: Not on file  . Number of children: Not on file  . Years of education: Not on file  . Highest education level: Not on file  Occupational History  . Not on file  Tobacco Use  . Smoking status: Former Games developer  . Smokeless tobacco: Never Used  Vaping Use  . Vaping Use: Never used  Substance and Sexual Activity  . Alcohol use: Yes    Alcohol/week: 0.0 - 3.0 standard drinks  . Drug use: No  . Sexual activity: Yes    Partners: Male    Birth control/protection: Surgical    Comment: TAH  Other Topics Concern  . Not on file  Social History Narrative  . Not on file   Social Determinants of Health   Financial Resource Strain: Not on file  Food Insecurity: Not on file  Transportation Needs: Not on file  Physical Activity: Not on file  Stress: Not on file  Social Connections: Not on file  Intimate Partner Violence: Not on file    Review of Systems  Constitutional: Negative.   HENT: Negative.   Eyes: Negative.   Respiratory: Negative.   Cardiovascular: Negative.   Gastrointestinal: Negative.   Endocrine: Negative.   Genitourinary: Positive for dysuria and frequency.  Musculoskeletal: Negative.   Skin: Negative.   Allergic/Immunologic: Negative.   Neurological: Negative.   Hematological: Negative.   Psychiatric/Behavioral: Negative.     PHYSICAL EXAMINATION:    BP 118/76 (BP Location: Right Arm, Patient Position: Sitting, Cuff Size: Normal)   Pulse 77   LMP 01/03/2000   SpO2 99%     General  appearance: alert, cooperative and appears stated age  Pelvic: External genitalia:  Follicle irritation on mons pubis.  Right labia majora with small sebaceous cyst.  Urethra:  normal appearing urethra with no masses, tenderness or lesions     Chaperone was present for exam.  ASSESSMENT  Cystitis.  Hx C Diff. Folliculitis and small sebaceous cyst of vulva. Does not appear to be molluscum.   PLAN  Urine dip:  sg 1.010, ph 7.0, 40 - 60 WBC, 0 - 2 RBC, 6 - 10 squams, mod bacteria.  UC sent.  Start Macrobid 100 mg po bid x 5 days.  Pyridium 200 mg po tid x 2 - 3 days prn.  Hydrate well.  Call if no improvement in 48 hours.  We discussed a short clip of her pubic hair instead of waxing on order to avoid skin irritation.   29 min  total time was spent for this patient encounter, including preparation, face-to-face counseling with the patient, coordination of care, and documentation of the encounter.

## 2020-06-07 NOTE — Patient Instructions (Signed)
Urinary Tract Infection, Adult  A urinary tract infection (UTI) is an infection of any part of the urinary tract. The urinary tract includes the kidneys, ureters, bladder, and urethra. These organs make, store, and get rid of urine in the body. An upper UTI affects the ureters and kidneys. A lower UTI affects the bladder and urethra. What are the causes? Most urinary tract infections are caused by bacteria in your genital area around your urethra, where urine leaves your body. These bacteria grow and cause inflammation of your urinary tract. What increases the risk? You are more likely to develop this condition if:  You have a urinary catheter that stays in place.  You are not able to control when you urinate or have a bowel movement (incontinence).  You are female and you: ? Use a spermicide or diaphragm for birth control. ? Have low estrogen levels. ? Are pregnant.  You have certain genes that increase your risk.  You are sexually active.  You take antibiotic medicines.  You have a condition that causes your flow of urine to slow down, such as: ? An enlarged prostate, if you are female. ? Blockage in your urethra. ? A kidney stone. ? A nerve condition that affects your bladder control (neurogenic bladder). ? Not getting enough to drink, or not urinating often.  You have certain medical conditions, such as: ? Diabetes. ? A weak disease-fighting system (immunesystem). ? Sickle cell disease. ? Gout. ? Spinal cord injury. What are the signs or symptoms? Symptoms of this condition include:  Needing to urinate right away (urgency).  Frequent urination. This may include small amounts of urine each time you urinate.  Pain or burning with urination.  Blood in the urine.  Urine that smells bad or unusual.  Trouble urinating.  Cloudy urine.  Vaginal discharge, if you are female.  Pain in the abdomen or the lower back. You may also have:  Vomiting or a decreased  appetite.  Confusion.  Irritability or tiredness.  A fever or chills.  Diarrhea. The first symptom in older adults may be confusion. In some cases, they may not have any symptoms until the infection has worsened. How is this diagnosed? This condition is diagnosed based on your medical history and a physical exam. You may also have other tests, including:  Urine tests.  Blood tests.  Tests for STIs (sexually transmitted infections). If you have had more than one UTI, a cystoscopy or imaging studies may be done to determine the cause of the infections. How is this treated? Treatment for this condition includes:  Antibiotic medicine.  Over-the-counter medicines to treat discomfort.  Drinking enough water to stay hydrated. If you have frequent infections or have other conditions such as a kidney stone, you may need to see a health care provider who specializes in the urinary tract (urologist). In rare cases, urinary tract infections can cause sepsis. Sepsis is a life-threatening condition that occurs when the body responds to an infection. Sepsis is treated in the hospital with IV antibiotics, fluids, and other medicines. Follow these instructions at home: Medicines  Take over-the-counter and prescription medicines only as told by your health care provider.  If you were prescribed an antibiotic medicine, take it as told by your health care provider. Do not stop using the antibiotic even if you start to feel better. General instructions  Make sure you: ? Empty your bladder often and completely. Do not hold urine for long periods of time. ? Empty your bladder after   sex. ? Wipe from front to back after urinating or having a bowel movement if you are female. Use each tissue only one time when you wipe.  Drink enough fluid to keep your urine pale yellow.  Keep all follow-up visits. This is important.   Contact a health care provider if:  Your symptoms do not get better after 1-2  days.  Your symptoms go away and then return. Get help right away if:  You have severe pain in your back or your lower abdomen.  You have a fever or chills.  You have nausea or vomiting. Summary  A urinary tract infection (UTI) is an infection of any part of the urinary tract, which includes the kidneys, ureters, bladder, and urethra.  Most urinary tract infections are caused by bacteria in your genital area.  Treatment for this condition often includes antibiotic medicines.  If you were prescribed an antibiotic medicine, take it as told by your health care provider. Do not stop using the antibiotic even if you start to feel better.  Keep all follow-up visits. This is important. This information is not intended to replace advice given to you by your health care provider. Make sure you discuss any questions you have with your health care provider. Document Revised: 08/01/2019 Document Reviewed: 08/01/2019 Elsevier Patient Education  2021 [employer]  

## 2020-06-10 LAB — URINE CULTURE
MICRO NUMBER:: 11972559
SPECIMEN QUALITY:: ADEQUATE

## 2020-06-10 LAB — URINALYSIS, COMPLETE W/RFL CULTURE
Bilirubin Urine: NEGATIVE
Glucose, UA: NEGATIVE
Hyaline Cast: NONE SEEN /LPF
Ketones, ur: NEGATIVE
Nitrites, Initial: NEGATIVE
Protein, ur: NEGATIVE
Specific Gravity, Urine: 1.01 (ref 1.001–1.035)
pH: 7 (ref 5.0–8.0)

## 2020-06-10 LAB — CULTURE INDICATED

## 2020-06-14 ENCOUNTER — Other Ambulatory Visit: Payer: Self-pay

## 2020-06-14 ENCOUNTER — Encounter: Payer: Self-pay | Admitting: Obstetrics and Gynecology

## 2020-06-14 MED ORDER — AMOXICILLIN-POT CLAVULANATE 500-125 MG PO TABS
1.0000 | ORAL_TABLET | Freq: Two times a day (BID) | ORAL | 0 refills | Status: DC
Start: 2020-06-14 — End: 2020-08-18

## 2020-06-14 NOTE — Telephone Encounter (Signed)
I recommend Augmentin 500 mg/125 mg po q 12 hours x 5 days.  Disp:  10 RF:  none  If symptoms persist, please have her make an office appointment.

## 2020-06-14 NOTE — Telephone Encounter (Signed)
Office visit 06/07/20 and treated with Macrobid. Ur Culture was positive for E. Coli

## 2020-06-23 ENCOUNTER — Ambulatory Visit: Payer: BC Managed Care – PPO | Admitting: Obstetrics and Gynecology

## 2020-06-23 ENCOUNTER — Encounter: Payer: Self-pay | Admitting: Obstetrics and Gynecology

## 2020-06-23 ENCOUNTER — Other Ambulatory Visit: Payer: Self-pay

## 2020-06-23 VITALS — BP 120/70

## 2020-06-23 DIAGNOSIS — B3731 Acute candidiasis of vulva and vagina: Secondary | ICD-10-CM

## 2020-06-23 DIAGNOSIS — B373 Candidiasis of vulva and vagina: Secondary | ICD-10-CM

## 2020-06-23 DIAGNOSIS — N760 Acute vaginitis: Secondary | ICD-10-CM

## 2020-06-23 DIAGNOSIS — B9689 Other specified bacterial agents as the cause of diseases classified elsewhere: Secondary | ICD-10-CM

## 2020-06-23 LAB — WET PREP FOR TRICH, YEAST, CLUE

## 2020-06-23 MED ORDER — BETAMETHASONE VALERATE 0.1 % EX OINT: TOPICAL_OINTMENT | CUTANEOUS | 0 refills | Status: DC

## 2020-06-23 MED ORDER — FLUCONAZOLE 150 MG PO TABS: 150.0000 mg | ORAL_TABLET | Freq: Once | ORAL | 0 refills | Status: AC

## 2020-06-23 MED ORDER — METRONIDAZOLE 0.75 % VA GEL: 1.0000 | Freq: Every day | VAGINAL | 0 refills | Status: DC

## 2020-06-23 NOTE — Progress Notes (Signed)
GYNECOLOGY  VISIT   HPI: 55 y.o.   Divorced White or Caucasian Not Hispanic or Latino  female   [phone] with Patient's last menstrual period was 01/03/2000.   here for  vaginal itch  The patient was treated with antibiotics earlier this month for a UTI. Vaginal itching started ~ 1 week ago, feels irritated, uncomfortable to sit. No vaginal discharge, feels very dry.   GYNECOLOGIC HISTORY: Patient's last menstrual period was 01/03/2000. Contraception:hyst Menopausal hormone therapy: none        OB History     Gravida  5   Para  3   Term  1   Preterm  2   AB  2   Living  3      SAB      IAB  1   Ectopic      Multiple      Live Births  3              Patient Active Problem List   Diagnosis Date Noted   Colon cancer screening 05/26/2020   Rectal bleeding 05/26/2020   Chronic constipation 12/06/2018   Status post arthroscopy of left shoulder 08/27/2018   Biceps tendinitis of right shoulder 08/20/2018   Right shoulder pain 08/20/2018   Rotator cuff tendonitis, left 01/25/2017   Generalized anxiety disorder 12/29/2014   Chronic left shoulder pain 07/11/2013   Insomnia 04/11/2013   Atypical chest pain 01/17/2013   Dyspnea 01/03/2013   Esophageal reflux 05/30/2012   Depression 05/30/2012   Chronic cough 05/30/2012    Past Medical History:  Diagnosis Date   Anxiety    C. difficile colitis    Chronic joint pain    Depression    IBS (irritable bowel syndrome)    Reflux     Past Surgical History:  Procedure Laterality Date   ABDOMINAL HYSTERECTOMY  2002   TAH- Fibroids , endometriosis; ov retained   BACK SURGERY  2005   L5-S1 Fusion   CESAREAN SECTION  1987   ESOPHAGOGASTRODUODENOSCOPY     LAPAROSCOPIC CHOLECYSTECTOMY  1995   MASTOPEXY  2012   REDUCTION MAMMAPLASTY Bilateral    SHOULDER SURGERY     TONSILLECTOMY     TUBAL LIGATION  1993   BTSP    Current Outpatient Medications  Medication Sig Dispense Refill   amoxicillin-clavulanate  (AUGMENTIN) 500-125 MG tablet Take 1 tablet (500 mg total) by mouth every 12 (twelve) hours. (Patient not taking: Reported on 06/23/2020) 10 tablet 0   COLLAGEN PO Take by mouth.     Cyanocobalamin (B-12 PO) Take by mouth.     gabapentin (NEURONTIN) 100 MG capsule Take one tablet po qhs (add to the 300 mg tablet), if tolerating after one week can increase to 2 tablets po qhs (with the 300 mg tablet), after another week to 3 tablets qhs (with the 300 mg tablet) 90 capsule 0   gabapentin (NEURONTIN) 300 MG capsule Take 1 capsule (300 mg total) by mouth at bedtime. 90 capsule 3   glucosamine-chondroitin 500-400 MG tablet Take 1 tablet by mouth 3 (three) times daily.     meloxicam (MOBIC) 7.5 MG tablet Take by mouth.     Multiple Vitamin (MULTIVITAMIN) tablet Take 1 tablet by mouth daily.     traZODone (DESYREL) 50 MG tablet TAKE 1 TO 2 TABLETS (50 MG TO 100 MG TOTAL) AT BEDTIME AS NEEDED FOR SLEEP 135 tablet 1   nitrofurantoin, macrocrystal-monohydrate, (MACROBID) 100 MG capsule Take 1 capsule (100 mg total)  by mouth 2 (two) times daily. Take for 5 days. (Patient not taking: Reported on 06/23/2020) 10 capsule 0   phenazopyridine (PYRIDIUM) 200 MG tablet Take 1 tablet (200 mg total) by mouth 3 (three) times daily as needed for pain. (Patient not taking: Reported on 06/23/2020) 10 tablet 0   No current facility-administered medications for this visit.     ALLERGIES: Effexor [venlafaxine hcl], Sulfa antibiotics, and Zoloft [sertraline hcl]  Family History  Problem Relation Age of Onset   Hypertension Mother    Diabetes Mother        Type 2   Thyroid disease Mother    Glaucoma Mother    Hypertension Father    Atrial fibrillation Father    Cancer Father        bladder & liver   Heart disease Father    Crohn's disease Father    Heart attack Maternal Grandfather    Diabetes Maternal Grandmother        Type 2   Hypertension Maternal Grandmother    Heart disease Maternal Grandmother    Heart  disease Paternal Grandmother    CAD Maternal Uncle    Hypertension Brother    Melanoma Brother    Heart murmur Brother    Other Brother        leaky aortic valve   Breast cancer Neg Hx     Social History   Socioeconomic History   Marital status: Divorced    Spouse name: Not on file   Number of children: Not on file   Years of education: Not on file   Highest education level: Not on file  Occupational History   Not on file  Tobacco Use   Smoking status: Former    Pack years: 0.00   Smokeless tobacco: Never  Vaping Use   Vaping Use: Never used  Substance and Sexual Activity   Alcohol use: Yes    Alcohol/week: 0.0 - 3.0 standard drinks   Drug use: No   Sexual activity: Yes    Partners: Male    Birth control/protection: Surgical    Comment: TAH  Other Topics Concern   Not on file  Social History Narrative   Not on file   Social Determinants of Health   Financial Resource Strain: Not on file  Food Insecurity: Not on file  Transportation Needs: Not on file  Physical Activity: Not on file  Stress: Not on file  Social Connections: Not on file  Intimate Partner Violence: Not on file    ROS  PHYSICAL EXAMINATION:    BP 120/70 (BP Location: Right Arm, Patient Position: Sitting, Cuff Size: Normal)   LMP 01/03/2000     General appearance: alert, cooperative and appears stated age  Pelvic: External genitalia:  mild erythema, fissure between the right labia minora and majora.               Urethra:  normal appearing urethra with no masses, tenderness or lesions              Bartholins and Skenes: normal                 Vagina: mildly erythematous appearing vagina with a slight increase in watery and thick, white vaginal d/c              Cervix: absent               Chaperone was present for exam.  1. Vulvovaginitis - WET PREP FOR TRICH, YEAST, CLUE: +  yeast and clue - betamethasone valerate ointment (VALISONE) 0.1 %; Use a pea sized amount BID for up to 2 weeks if  needed.  Dispense: 15 g; Refill: 0  2. Yeast vaginitis - fluconazole (DIFLUCAN) 150 MG tablet; Take 1 tablet (150 mg total) by mouth once for 1 dose. Take one tablet.  Repeat in 72 hours if symptoms are not completely resolved.  Dispense: 2 tablet; Refill: 0 - betamethasone valerate ointment (VALISONE) 0.1 %; Use a pea sized amount BID for up to 2 weeks if needed.  Dispense: 15 g; Refill: 0  3. BV (bacterial vaginosis) - metroNIDAZOLE (METROGEL) 0.75 % vaginal gel; Place 1 Applicatorful vaginally at bedtime. Use for 5 nights.  Dispense: 70 g; Refill: 0

## 2020-06-23 NOTE — Patient Instructions (Signed)

## 2020-07-13 ENCOUNTER — Encounter: Payer: Self-pay | Admitting: Obstetrics and Gynecology

## 2020-07-13 DIAGNOSIS — N951 Menopausal and female climacteric states: Secondary | ICD-10-CM

## 2020-07-14 MED ORDER — GABAPENTIN 100 MG PO CAPS: ORAL_CAPSULE | ORAL | 3 refills | Status: DC

## 2020-08-16 ENCOUNTER — Encounter: Payer: Self-pay | Admitting: Obstetrics and Gynecology

## 2020-08-17 ENCOUNTER — Telehealth: Payer: Self-pay | Admitting: *Deleted

## 2020-08-17 NOTE — Telephone Encounter (Signed)
Patient called  with symptoms of Low back pain and fatigue. Previous pain with urination, which has subsided.Patient would like to know if she can be treated over phone or should she come in. I will route this to provider for recommendations.

## 2020-08-17 NOTE — Telephone Encounter (Signed)
She should be seen. Please put her in at 8:15 tomorrow

## 2020-08-17 NOTE — Telephone Encounter (Signed)
Message sent to [facility] appointment pool to contact patient.

## 2020-08-18 ENCOUNTER — Encounter: Payer: Self-pay | Admitting: Obstetrics and Gynecology

## 2020-08-18 ENCOUNTER — Ambulatory Visit: Payer: BC Managed Care – PPO | Admitting: Obstetrics and Gynecology

## 2020-08-18 ENCOUNTER — Other Ambulatory Visit: Payer: Self-pay

## 2020-08-18 VITALS — BP 122/68 | HR 64 | Ht 63.0 in | Wt 141.0 lb

## 2020-08-18 DIAGNOSIS — R1032 Left lower quadrant pain: Secondary | ICD-10-CM

## 2020-08-18 DIAGNOSIS — Z87898 Personal history of other specified conditions: Secondary | ICD-10-CM

## 2020-08-18 DIAGNOSIS — M546 Pain in thoracic spine: Secondary | ICD-10-CM | POA: Diagnosis not present

## 2020-08-18 DIAGNOSIS — R6882 Decreased libido: Secondary | ICD-10-CM

## 2020-08-18 DIAGNOSIS — N959 Unspecified menopausal and perimenopausal disorder: Secondary | ICD-10-CM

## 2020-08-18 DIAGNOSIS — Z7189 Other specified counseling: Secondary | ICD-10-CM

## 2020-08-18 LAB — URINALYSIS, COMPLETE W/RFL CULTURE
Bacteria, UA: NONE SEEN /HPF
Bilirubin Urine: NEGATIVE
Glucose, UA: NEGATIVE
Hgb urine dipstick: NEGATIVE
Hyaline Cast: NONE SEEN /LPF
Ketones, ur: NEGATIVE
Leukocyte Esterase: NEGATIVE
Nitrites, Initial: NEGATIVE
Protein, ur: NEGATIVE
RBC / HPF: NONE SEEN /HPF (ref 0–2)
Specific Gravity, Urine: 1.015 (ref 1.001–1.035)
WBC, UA: NONE SEEN /HPF (ref 0–5)
pH: 6 (ref 5.0–8.0)

## 2020-08-18 LAB — NO CULTURE INDICATED

## 2020-08-18 NOTE — Patient Instructions (Signed)

## 2020-08-18 NOTE — Progress Notes (Addendum)
GYNECOLOGY  VISIT   HPI: 55 y.o.   Divorced White or Caucasian Not Hispanic or Latino  female   [phone] with Patient's last menstrual period was 01/03/2000.   here for dysuria and lower back pain.  On Sunday night she started having dysuria and lower back pain. She increased her hydration and the burning has stopped, lower back pain persists. No urinary frequency or urgency.  The back pain is a low level nagging, L>R. No increase in strenuous exercise. She has had some LLQ abdominal pain since Sunday as well.  Long h/o constipation. She had a good BM on Sunday. Typically can go a week without a BM. She doesn't have more gas.  She did have more gas when she increased her gabapentin in July, now back to 300 mg at bedtime and is doing okay.  A week ago she was started on testosterone pellets by her Naturopath.     GYNECOLOGIC HISTORY: Patient's last menstrual period was 01/03/2000. Contraception:hysterectomy  Menopausal hormone therapy: none         OB History     Gravida  5   Para  3   Term  1   Preterm  2   AB  2   Living  3      SAB      IAB  1   Ectopic      Multiple      Live Births  3              Patient Active Problem List   Diagnosis Date Noted   Colon cancer screening 05/26/2020   Rectal bleeding 05/26/2020   Chronic constipation 12/06/2018   Status post arthroscopy of left shoulder 08/27/2018   Biceps tendinitis of right shoulder 08/20/2018   Right shoulder pain 08/20/2018   Rotator cuff tendonitis, left 01/25/2017   Generalized anxiety disorder 12/29/2014   Chronic left shoulder pain 07/11/2013   Insomnia 04/11/2013   Atypical chest pain 01/17/2013   Dyspnea 01/03/2013   Esophageal reflux 05/30/2012   Depression 05/30/2012   Chronic cough 05/30/2012    Past Medical History:  Diagnosis Date   Anxiety    C. difficile colitis    Chronic joint pain    Depression    IBS (irritable bowel syndrome)    Reflux     Past Surgical History:   Procedure Laterality Date   ABDOMINAL HYSTERECTOMY  2002   TAH- Fibroids , endometriosis; ov retained   BACK SURGERY  2005   L5-S1 Fusion   CESAREAN SECTION  1987   ESOPHAGOGASTRODUODENOSCOPY     LAPAROSCOPIC CHOLECYSTECTOMY  1995   MASTOPEXY  2012   REDUCTION MAMMAPLASTY Bilateral    SHOULDER SURGERY     TONSILLECTOMY     TUBAL LIGATION  1993   BTSP    Current Outpatient Medications  Medication Sig Dispense Refill   gabapentin (NEURONTIN) 300 MG capsule Take 1 capsule (300 mg total) by mouth at bedtime. 90 capsule 3   meloxicam (MOBIC) 7.5 MG tablet Take by mouth.     metroNIDAZOLE (METROGEL) 0.75 % vaginal gel Place 1 Applicatorful vaginally at bedtime. Use for 5 nights. 70 g 0   Multiple Vitamin (MULTIVITAMIN) tablet Take 1 tablet by mouth daily.     traZODone (DESYREL) 50 MG tablet TAKE 1 TO 2 TABLETS (50 MG TO 100 MG TOTAL) AT BEDTIME AS NEEDED FOR SLEEP 135 tablet 1   No current facility-administered medications for this visit.     ALLERGIES: Effexor [  venlafaxine hcl], Sulfa antibiotics, and Zoloft [sertraline hcl]  Family History  Problem Relation Age of Onset   Hypertension Mother    Diabetes Mother        Type 2   Thyroid disease Mother    Glaucoma Mother    Hypertension Father    Atrial fibrillation Father    Cancer Father        bladder & liver   Heart disease Father    Crohn's disease Father    Heart attack Maternal Grandfather    Diabetes Maternal Grandmother        Type 2   Hypertension Maternal Grandmother    Heart disease Maternal Grandmother    Heart disease Paternal Grandmother    CAD Maternal Uncle    Hypertension Brother    Melanoma Brother    Heart murmur Brother    Other Brother        leaky aortic valve   Breast cancer Neg Hx     Social History   Socioeconomic History   Marital status: Divorced    Spouse name: Not on file   Number of children: Not on file   Years of education: Not on file   Highest education level: Not on file   Occupational History   Not on file  Tobacco Use   Smoking status: Former   Smokeless tobacco: Never  Vaping Use   Vaping Use: Never used  Substance and Sexual Activity   Alcohol use: Yes    Alcohol/week: 0.0 - 3.0 standard drinks   Drug use: No   Sexual activity: Yes    Partners: Male    Birth control/protection: Surgical    Comment: TAH  Other Topics Concern   Not on file  Social History Narrative   Not on file   Social Determinants of Health   Financial Resource Strain: Not on file  Food Insecurity: Not on file  Transportation Needs: Not on file  Physical Activity: Not on file  Stress: Not on file  Social Connections: Not on file  Intimate Partner Violence: Not on file    Review of Systems  Genitourinary:  Positive for dysuria and flank pain.  All other systems reviewed and are negative.  PHYSICAL EXAMINATION:    BP 122/68   Pulse 64   Ht 5\' 3"  (1.6 m)   Wt 141 lb (64 kg)   LMP 01/03/2000   SpO2 100%   BMI 24.98 kg/m     General appearance: alert, cooperative and appears stated age Abdomen: soft, mildly tender BLQ, no rebound, no guarding; non distended, no masses,  no organomegaly Lower back: not tender  Pelvic: External genitalia:  no lesions              Urethra:  normal appearing urethra with no masses, tenderness or lesions              Bartholins and Skenes: normal                 Cervix: absent              Bimanual Exam:  Uterus:  uterus absent              Adnexa:  no masses, mildly tender bilaterally              Rectovaginal: Yes.  .  Confirms.              Anus:  normal sphincter tone, no lesions  1. Lower thoracic  back pain Not c/w MS pain. Normal exam  2. LLQ abdominal pain Mildly tender, no masses.   3. History of dysuria Resolved - Urinalysis,Complete w/RFL Culture  4. Menopausal problem Recently started on testosterone pellets. Labs from [facility] reviewed and scanned. Patient counseled on risks of testosterone pellets.  Recommended she have her levels checked one month after pellet placement  5. Counseling for hormone replacement therapy Discussed the option of ERT, reviewed the risks  6. Low libido Discussed the off label use of testosterone and need to keep her testosterone levels in a normal female range.   Over 30 minutes in total patient care.   Addendum: advised to take Ibuprofen as needed. Call if pain doesn't resolve.

## 2020-09-08 ENCOUNTER — Ambulatory Visit (INDEPENDENT_AMBULATORY_CARE_PROVIDER_SITE_OTHER): Payer: BC Managed Care – PPO | Admitting: Internal Medicine

## 2020-09-20 ENCOUNTER — Other Ambulatory Visit: Payer: Self-pay | Admitting: Family Medicine

## 2020-11-23 ENCOUNTER — Ambulatory Visit
Admission: RE | Admit: 2020-11-23 | Discharge: 2020-11-23 | Disposition: A | Discharge status: Home / Self Care | Payer: BC Managed Care – PPO | Source: Ambulatory Visit | Attending: Obstetrics and Gynecology | Admitting: Obstetrics and Gynecology

## 2020-11-23 ENCOUNTER — Other Ambulatory Visit: Payer: Self-pay | Admitting: Obstetrics and Gynecology

## 2020-11-23 DIAGNOSIS — Z1231 Encounter for screening mammogram for malignant neoplasm of breast: Secondary | ICD-10-CM

## 2021-03-03 DIAGNOSIS — L719 Rosacea, unspecified: Secondary | ICD-10-CM | POA: Insufficient documentation

## 2021-05-31 ENCOUNTER — Ambulatory Visit: Payer: BC Managed Care – PPO | Admitting: Obstetrics and Gynecology

## 2021-05-31 NOTE — Progress Notes (Deleted)
56 y.o. [id number] Divorced White or Caucasian Not Hispanic or Latino female here for annual exam.      Patient's last menstrual period was 01/03/2000.          Sexually active: {yes no:314532}  The current method of family planning is {contraception:315051}.    Exercising: {yes no:314532}  {types:19826} Smoker:  {YES NO:22349}  Health Maintenance: Pap:  2012 ( hysterectomy)  History of abnormal Pap:  no MMG:  11/24/20  Bi-rads 1 neg  BMD:   none  Colonoscopy: 7/20, was told she needed f/u in 1 year  (inadequate prep) TDaP:  02/02/12  Gardasil: n/a   reports that she has quit smoking. She has never used smokeless tobacco. She reports current alcohol use. She reports that she does not use drugs.  Past Medical History:  Diagnosis Date   Anxiety    C. difficile colitis    Chronic joint pain    Depression    IBS (irritable bowel syndrome)    Reflux     Past Surgical History:  Procedure Laterality Date   ABDOMINAL HYSTERECTOMY  2002   TAH- Fibroids , endometriosis; ov retained   BACK SURGERY  2005   L5-S1 Fusion   CESAREAN SECTION  1987   ESOPHAGOGASTRODUODENOSCOPY     LAPAROSCOPIC CHOLECYSTECTOMY  1995   MASTOPEXY  2012   REDUCTION MAMMAPLASTY Bilateral    SHOULDER SURGERY     TONSILLECTOMY     TUBAL LIGATION  1993   BTSP    Current Outpatient Medications  Medication Sig Dispense Refill   gabapentin (NEURONTIN) 300 MG capsule Take 1 capsule (300 mg total) by mouth at bedtime. 90 capsule 3   meloxicam (MOBIC) 7.5 MG tablet Take by mouth.     metroNIDAZOLE (METROGEL) 0.75 % vaginal gel Place 1 Applicatorful vaginally at bedtime. Use for 5 nights. 70 g 0   Multiple Vitamin (MULTIVITAMIN) tablet Take 1 tablet by mouth daily.     traZODone (DESYREL) 50 MG tablet TAKE 1 TO 2 TABLETS BY MOUTH AT BEDTIME AS NEEDED FOR SLEEP 135 tablet 0   No current facility-administered medications for this visit.    Family History  Problem Relation Age of Onset   Hypertension Mother     Diabetes Mother        Type 2   Thyroid disease Mother    Glaucoma Mother    Hypertension Father    Atrial fibrillation Father    Cancer Father        bladder & liver   Heart disease Father    Crohn's disease Father    Heart attack Maternal Grandfather    Diabetes Maternal Grandmother        Type 2   Hypertension Maternal Grandmother    Heart disease Maternal Grandmother    Heart disease Paternal Grandmother    CAD Maternal Uncle    Hypertension Brother    Melanoma Brother    Heart murmur Brother    Other Brother        leaky aortic valve   Breast cancer Neg Hx     Review of Systems  Exam:   LMP 01/03/2000   Weight change: @WEIGHTCHANGE @ Height:      Ht Readings from Last 3 Encounters:  08/18/20 5\' 3"  (1.6 m)  05/26/20 5\' 3"  (1.6 m)  09/18/19 5\' 3"  (1.6 m)    General appearance: alert, cooperative and appears stated age Head: Normocephalic, without obvious abnormality, atraumatic Neck: no adenopathy, supple, symmetrical, trachea midline and thyroid {  CHL AMB PHY EX THYROID NORM DEFAULT:[phone]::"normal to inspection and palpation"} Lungs: clear to auscultation bilaterally Cardiovascular: regular rate and rhythm Breasts: {Exam; breast:13139::"normal appearance, no masses or tenderness"} Abdomen: soft, non-tender; non distended,  no masses,  no organomegaly Extremities: extremities normal, atraumatic, no cyanosis or edema Skin: Skin color, texture, turgor normal. No rashes or lesions Lymph nodes: Cervical, supraclavicular, and axillary nodes normal. No abnormal inguinal nodes palpated Neurologic: Grossly normal   Pelvic: External genitalia:  no lesions              Urethra:  normal appearing urethra with no masses, tenderness or lesions              Bartholins and Skenes: normal                 Vagina: normal appearing vagina with normal color and discharge, no lesions              Cervix: {CHL AMB PHY EX CERVIX NORM DEFAULT:[phone]::"no lesions"}                Bimanual Exam:  Uterus:  {CHL AMB PHY EX UTERUS NORM DEFAULT:[phone]::"normal size, contour, position, consistency, mobility, non-tender"}              Adnexa: {CHL AMB PHY EX ADNEXA NO MASS DEFAULT:[phone]::"no mass, fullness, tenderness"}               Rectovaginal: Confirms               Anus:  normal sphincter tone, no lesions  *** chaperoned for the exam.  A:  Well Woman with normal exam  P:

## 2021-06-03 ENCOUNTER — Ambulatory Visit: Payer: BC Managed Care – PPO | Admitting: Obstetrics and Gynecology

## 2021-06-15 ENCOUNTER — Other Ambulatory Visit: Payer: Self-pay | Admitting: *Deleted

## 2021-06-15 DIAGNOSIS — N951 Menopausal and female climacteric states: Secondary | ICD-10-CM

## 2021-06-15 NOTE — Telephone Encounter (Signed)
Patient called requesting refill on gabapentin 300 mg capsule. Last annual exam was 05/2020. Message sent to appointments to schedule annual exam.

## 2021-06-15 NOTE — Telephone Encounter (Signed)
Annual exam scheduled on 06/28/21

## 2021-06-16 MED ORDER — GABAPENTIN 300 MG PO CAPS: 300.0000 mg | ORAL_CAPSULE | Freq: Every day | ORAL | 0 refills | Status: DC

## 2021-06-28 ENCOUNTER — Encounter: Payer: Self-pay | Admitting: Obstetrics and Gynecology

## 2021-06-28 ENCOUNTER — Ambulatory Visit (INDEPENDENT_AMBULATORY_CARE_PROVIDER_SITE_OTHER): Payer: BC Managed Care – PPO | Admitting: Obstetrics and Gynecology

## 2021-06-28 VITALS — BP 122/64 | HR 59 | Ht 63.0 in | Wt 141.0 lb

## 2021-06-28 DIAGNOSIS — Z01419 Encounter for gynecological examination (general) (routine) without abnormal findings: Secondary | ICD-10-CM

## 2021-06-28 DIAGNOSIS — N390 Urinary tract infection, site not specified: Secondary | ICD-10-CM

## 2021-06-28 DIAGNOSIS — E782 Mixed hyperlipidemia: Secondary | ICD-10-CM | POA: Insufficient documentation

## 2021-06-28 DIAGNOSIS — G479 Sleep disorder, unspecified: Secondary | ICD-10-CM | POA: Insufficient documentation

## 2021-06-28 DIAGNOSIS — M2550 Pain in unspecified joint: Secondary | ICD-10-CM | POA: Insufficient documentation

## 2021-06-28 DIAGNOSIS — N951 Menopausal and female climacteric states: Secondary | ICD-10-CM

## 2021-06-28 DIAGNOSIS — R6882 Decreased libido: Secondary | ICD-10-CM | POA: Insufficient documentation

## 2021-06-28 DIAGNOSIS — R232 Flushing: Secondary | ICD-10-CM | POA: Insufficient documentation

## 2021-06-28 DIAGNOSIS — Z780 Asymptomatic menopausal state: Secondary | ICD-10-CM | POA: Insufficient documentation

## 2021-06-28 DIAGNOSIS — Z23 Encounter for immunization: Secondary | ICD-10-CM | POA: Diagnosis not present

## 2021-06-28 DIAGNOSIS — N898 Other specified noninflammatory disorders of vagina: Secondary | ICD-10-CM | POA: Insufficient documentation

## 2021-06-28 DIAGNOSIS — R768 Other specified abnormal immunological findings in serum: Secondary | ICD-10-CM | POA: Insufficient documentation

## 2021-06-28 MED ORDER — NITROFURANTOIN MACROCRYSTAL 50 MG PO CAPS: ORAL_CAPSULE | ORAL | 0 refills | Status: DC

## 2021-06-28 MED ORDER — GABAPENTIN 300 MG PO CAPS: 300.0000 mg | ORAL_CAPSULE | Freq: Every day | ORAL | 3 refills | Status: DC

## 2021-07-02 ENCOUNTER — Ambulatory Visit
Admission: EM | Admit: 2021-07-02 | Discharge: 2021-07-02 | Disposition: A | Discharge status: Home / Self Care | Payer: BC Managed Care – PPO | Attending: Family Medicine | Admitting: Family Medicine

## 2021-07-02 DIAGNOSIS — N898 Other specified noninflammatory disorders of vagina: Secondary | ICD-10-CM | POA: Insufficient documentation

## 2021-07-02 DIAGNOSIS — R300 Dysuria: Secondary | ICD-10-CM | POA: Diagnosis present

## 2021-07-02 LAB — POCT URINALYSIS DIP (MANUAL ENTRY)
Bilirubin, UA: NEGATIVE
Blood, UA: NEGATIVE
Glucose, UA: NEGATIVE mg/dL
Ketones, POC UA: NEGATIVE mg/dL
Nitrite, UA: NEGATIVE
Protein Ur, POC: NEGATIVE mg/dL
Spec Grav, UA: 1.02 (ref 1.010–1.025)
Urobilinogen, UA: 0.2 E.U./dL
pH, UA: 6 (ref 5.0–8.0)

## 2021-07-02 MED ORDER — CEPHALEXIN 500 MG PO CAPS: 500.0000 mg | ORAL_CAPSULE | Freq: Two times a day (BID) | ORAL | 0 refills | Status: DC

## 2021-07-02 NOTE — ED Provider Notes (Signed)
[facility]    CSN: [id number] Arrival date & time: 07/02/21  0809      History   Chief Complaint Chief Complaint  Patient presents with   Dysuria    HPI Angel Silva is a 56 y.o. female.   Presenting today with 1 week history of waxing and waning dysuria, urinary frequency, cloudy urine, vaginal discomfort, lower back aching.  Denies fever, chills, nausea, vomiting, diarrhea, constipation, vaginal discharge.  So far not trying anything over-the-counter for symptoms apart from drinking more water.    Past Medical History:  Diagnosis Date   Anxiety    C. difficile colitis    Chronic joint pain    Depression    IBS (irritable bowel syndrome)    Reflux     Patient Active Problem List   Diagnosis Date Noted   Hot flashes 06/28/2021   Joint pain 06/28/2021   Low libido 06/28/2021   Menopause 06/28/2021   Mixed hyperlipidemia 06/28/2021   Other specified abnormal immunological findings in serum 06/28/2021   Sleep disturbance 06/28/2021   Vaginal dryness 06/28/2021   Rosacea 03/03/2021   Colon cancer screening 05/26/2020   Rectal bleeding 05/26/2020   Chronic constipation 12/06/2018   Status post arthroscopy of left shoulder 08/27/2018   Biceps tendinitis of right shoulder 08/20/2018   Right shoulder pain 08/20/2018   Rotator cuff tendonitis, left 01/25/2017   Generalized anxiety disorder 12/29/2014   Chronic left shoulder pain 07/11/2013   Insomnia 04/11/2013   Atypical chest pain 01/17/2013   Dyspnea 01/03/2013   Esophageal reflux 05/30/2012   Depression 05/30/2012   Chronic cough 05/30/2012    Past Surgical History:  Procedure Laterality Date   ABDOMINAL HYSTERECTOMY  2002   TAH- Fibroids , endometriosis; ov retained   BACK SURGERY  2005   L5-S1 Fusion   CESAREAN SECTION  1987   ESOPHAGOGASTRODUODENOSCOPY     LAPAROSCOPIC CHOLECYSTECTOMY  1995   MASTOPEXY  2012   REDUCTION MAMMAPLASTY Bilateral    SHOULDER SURGERY     TONSILLECTOMY      TUBAL LIGATION  1993   BTSP    OB History     Gravida  5   Para  3   Term  1   Preterm  2   AB  2   Living  3      SAB      IAB  1   Ectopic      Multiple      Live Births  3            Home Medications    Prior to Admission medications   Medication Sig Start Date End Date Taking? Authorizing Provider  cephALEXin (KEFLEX) 500 MG capsule Take 1 capsule (500 mg total) by mouth 2 (two) times daily. 07/02/21  Yes Volney American, PA-C  gabapentin (NEURONTIN) 300 MG capsule Take 1 capsule (300 mg total) by mouth at bedtime. 06/28/21   Salvadore Dom, MD  Multiple Vitamin (MULTIVITAMIN) tablet Take 1 tablet by mouth daily.    [provider]  nitrofurantoin (MACRODANTIN) 50 MG capsule Take one tablet po prn intercourse 06/28/21   Salvadore Dom, MD  progesterone (PROMETRIUM) 100 MG capsule one capsule 03/04/21   [provider]  traZODone (DESYREL) 50 MG tablet TAKE 1 TO 2 TABLETS BY MOUTH AT BEDTIME AS NEEDED FOR SLEEP 09/21/20   Nilda Simmer, NP  UNABLE TO FIND See admin instructions. Estrogen pellets 11/19/20   [provider]  UNABLE TO FIND See admin instructions. Testosterone pellets 100 mg 08/06/20   [provider]    Family History Family History  Problem Relation Age of Onset   Hypertension Mother    Diabetes Mother        Type 2   Thyroid disease Mother    Glaucoma Mother    Hypertension Father    Atrial fibrillation Father    Cancer Father        bladder & liver   Heart disease Father    Crohn's disease Father    Heart attack Maternal Grandfather    Diabetes Maternal Grandmother        Type 2   Hypertension Maternal Grandmother    Heart disease Maternal Grandmother    Heart disease Paternal Grandmother    CAD Maternal Uncle    Hypertension Brother    Melanoma Brother    Heart murmur Brother    Other Brother        leaky aortic valve   Breast cancer Neg Hx     Social  History Social History   Tobacco Use   Smoking status: Former   Smokeless tobacco: Never  Building services engineer Use: Never used  Substance Use Topics   Alcohol use: Yes    Alcohol/week: 0.0 - 3.0 standard drinks of alcohol   Drug use: No     Allergies   Effexor [venlafaxine hcl], Sulfa antibiotics, and Zoloft [sertraline hcl]   Review of Systems Review of Systems Per HPI  Physical Exam Triage Vital Signs ED Triage Vitals  Enc Vitals Group     BP 07/02/21 0831 (!) 151/89     Pulse Rate 07/02/21 0831 77     Resp 07/02/21 0831 16     Temp 07/02/21 0831 97.7 F (36.5 C)     Temp Source 07/02/21 0831 Oral     SpO2 07/02/21 0831 96 %     Weight --      Height --      Head Circumference --      Peak Flow --      Pain Score 07/02/21 0829 3     Pain Loc --      Pain Edu? --      Excl. in GC? --    No data found.  Updated Vital Signs BP (!) 151/89 (BP Location: Right Arm)   Pulse 77   Temp 97.7 F (36.5 C) (Oral)   Resp 16   LMP 01/03/2000   SpO2 96%   Visual Acuity Right Eye Distance:   Left Eye Distance:   Bilateral Distance:    Right Eye Near:   Left Eye Near:    Bilateral Near:     Physical Exam Vitals and nursing note reviewed.  Constitutional:      Appearance: Normal appearance. She is not ill-appearing.  HENT:     Head: Atraumatic.     Mouth/Throat:     Mouth: Mucous membranes are moist.     Pharynx: Oropharynx is clear.  Eyes:     Extraocular Movements: Extraocular movements intact.     Conjunctiva/sclera: Conjunctivae normal.  Cardiovascular:     Rate and Rhythm: Normal rate and regular rhythm.     Heart sounds: Normal heart sounds.  Pulmonary:     Effort: Pulmonary effort is normal.     Breath sounds: Normal breath sounds.  Abdominal:     General: Bowel sounds are normal. There is no distension.  Palpations: Abdomen is soft.     Tenderness: There is no abdominal tenderness. There is no right CVA tenderness, left CVA tenderness or  guarding.  Genitourinary:    Comments: GU exam deferred, self swab performed Musculoskeletal:        General: Normal range of motion.     Cervical back: Normal range of motion and neck supple.  Skin:    General: Skin is warm and dry.  Neurological:     Mental Status: She is alert and oriented to person, place, and time.     Motor: No weakness.     Gait: Gait normal.  Psychiatric:        Mood and Affect: Mood normal.        Thought Content: Thought content normal.        Judgment: Judgment normal.      UC Treatments / Results  Labs (all labs ordered are listed, but only abnormal results are displayed) Labs Reviewed  POCT URINALYSIS DIP (MANUAL ENTRY) - Abnormal; Notable for the following components:      Result Value   Leukocytes, UA Trace (*)    All other components within normal limits  URINE CULTURE  CERVICOVAGINAL ANCILLARY ONLY    EKG   Radiology No results found.  Procedures Procedures (including critical care time)  Medications Ordered in UC Medications - No data to display  Initial Impression / Assessment and Plan / UC Course  I have reviewed the triage vital signs and the nursing notes.  Pertinent labs & imaging results that were available during my care of the patient were reviewed by me and considered in my medical decision making (see chart for details).     Trace leuks on urinalysis, urine culture and vaginal swab pending, treat with Keflex while awaiting results in case urinary tract infection.  Push fluids, return for worsening symptoms.  Final Clinical Impressions(s) / UC Diagnoses   Final diagnoses:  Dysuria  Vaginal irritation   Discharge Instructions   None    ED Prescriptions     Medication Sig Dispense Auth. Provider   cephALEXin (KEFLEX) 500 MG capsule Take 1 capsule (500 mg total) by mouth 2 (two) times daily. 10 capsule Particia Nearing, [place]      PDMP not reviewed this encounter.   Roosvelt Maser [place],  [place] 07/02/21 [phone]

## 2021-07-02 NOTE — ED Triage Notes (Signed)
Pt reports burning when urinating, increased urinary frequency, cloudy urine, vaginal discomfort, lower back pain x 1 week.

## 2021-07-03 LAB — URINE CULTURE: Culture: NO GROWTH

## 2021-07-04 ENCOUNTER — Telehealth (HOSPITAL_COMMUNITY): Payer: Self-pay | Admitting: Emergency Medicine

## 2021-07-04 LAB — CERVICOVAGINAL ANCILLARY ONLY
Bacterial Vaginitis (gardnerella): NEGATIVE
Candida Glabrata: NEGATIVE
Candida Vaginitis: POSITIVE — AB
Comment: NEGATIVE
Comment: NEGATIVE
Comment: NEGATIVE

## 2021-07-04 MED ORDER — FLUCONAZOLE 150 MG PO TABS: 150.0000 mg | ORAL_TABLET | Freq: Once | ORAL | 0 refills | Status: DC

## 2021-07-04 MED ORDER — FLUCONAZOLE 150 MG PO TABS: 150.0000 mg | ORAL_TABLET | Freq: Once | ORAL | 0 refills | Status: AC

## 2021-09-16 IMAGING — MG DIGITAL DIAGNOSTIC BILAT W/ TOMO W/ CAD
6 of 12 series · 6 of 36 positions shown · non-contrast
Comparison: Previous exam(s).

CLINICAL DATA: Patient with history of right breast biopsy which
demonstrated acute and chronic inflammation consistent with evolving
exams. Patient presents for re-evaluation.

EXAM:
DIGITAL DIAGNOSTIC BILATERAL MAMMOGRAM WITH CAD AND TOMO
ULTRASOUND BILATERAL BREAST

[L CC synth-2D (1 of 2)]
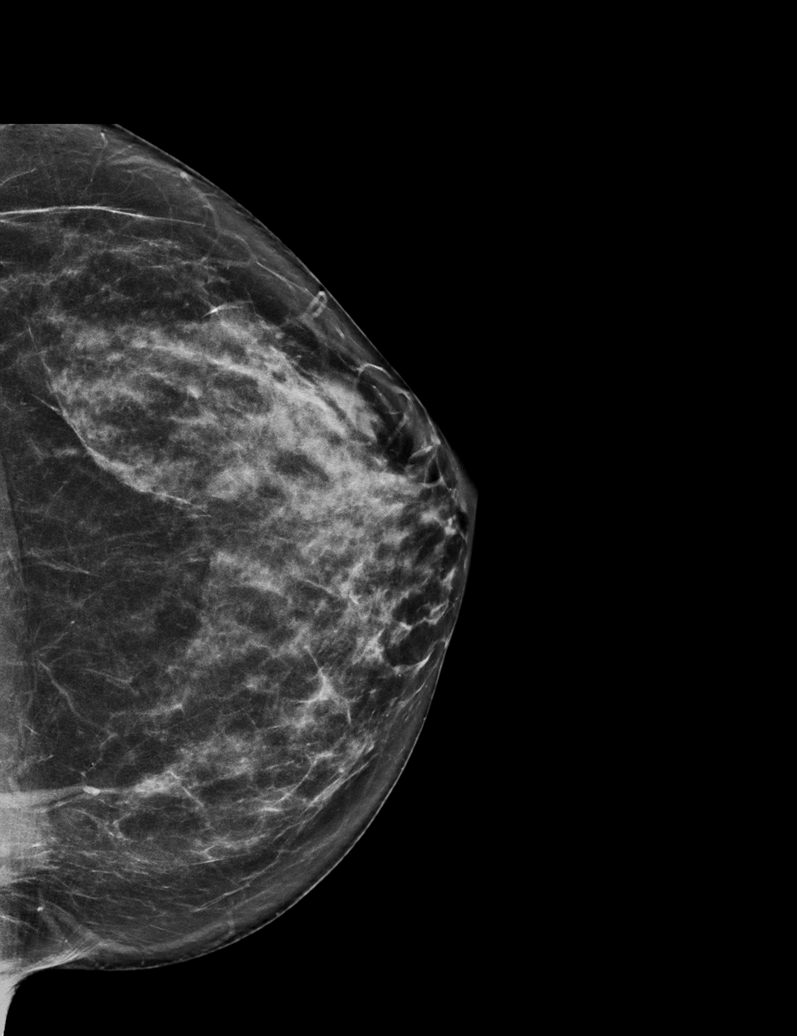

[L CC synth-2D (2 of 2)]
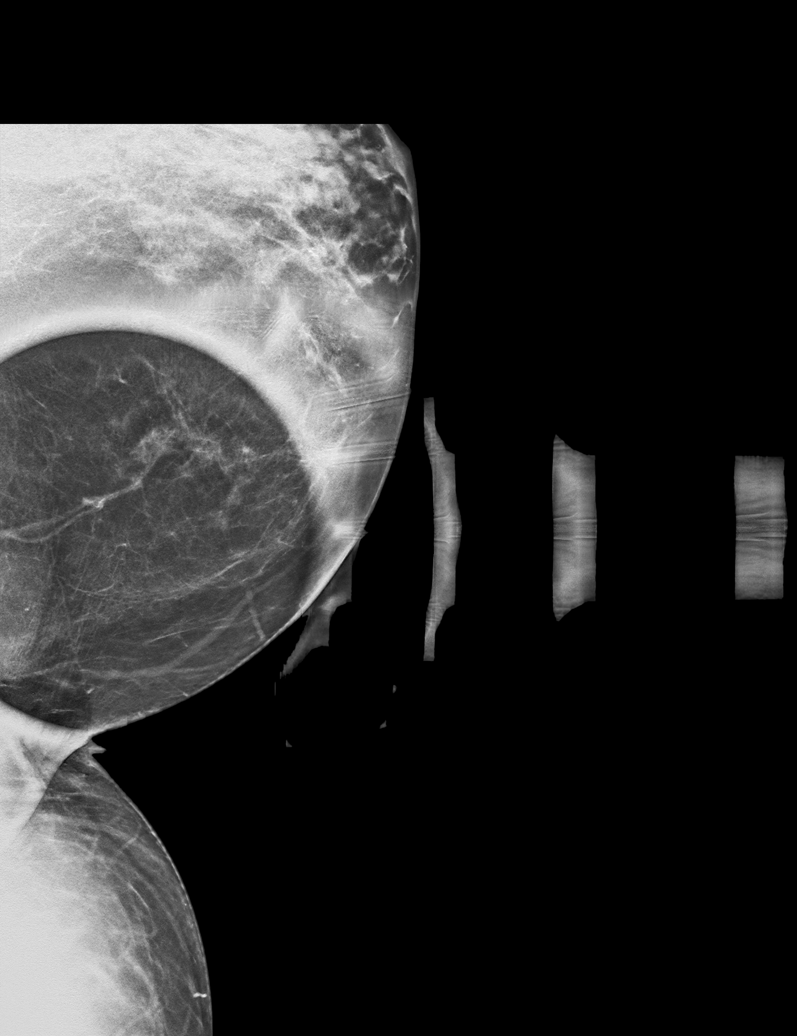

[R MLO synth-2D]
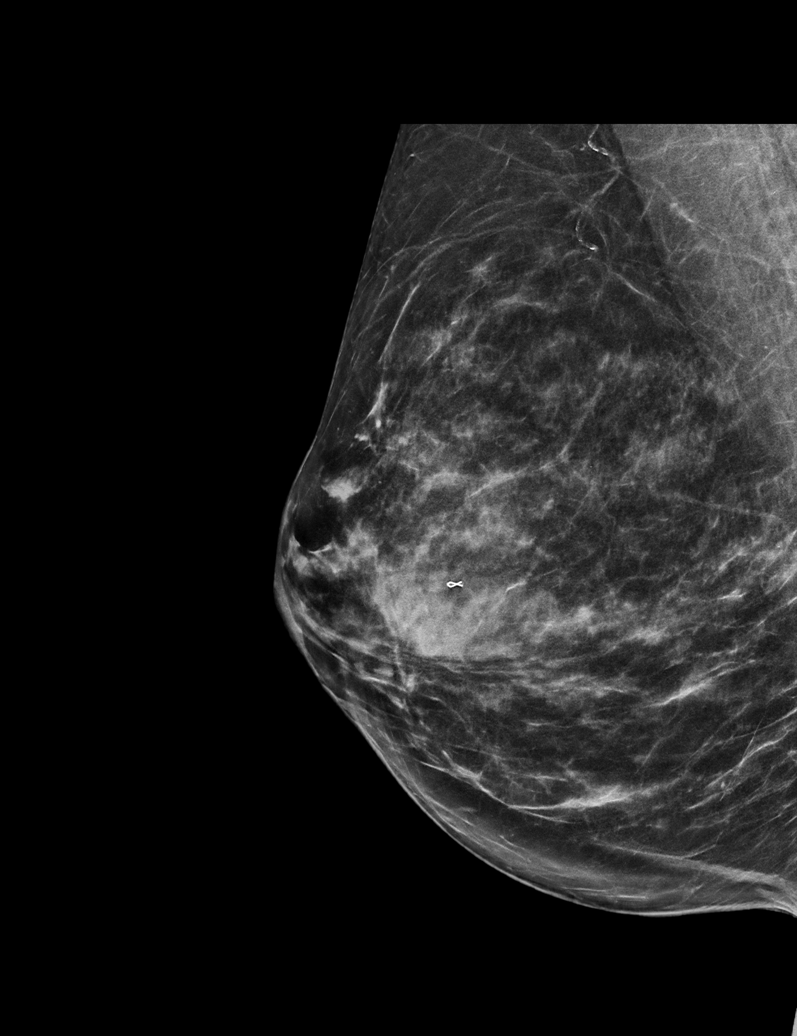

[R CC synth-2D]
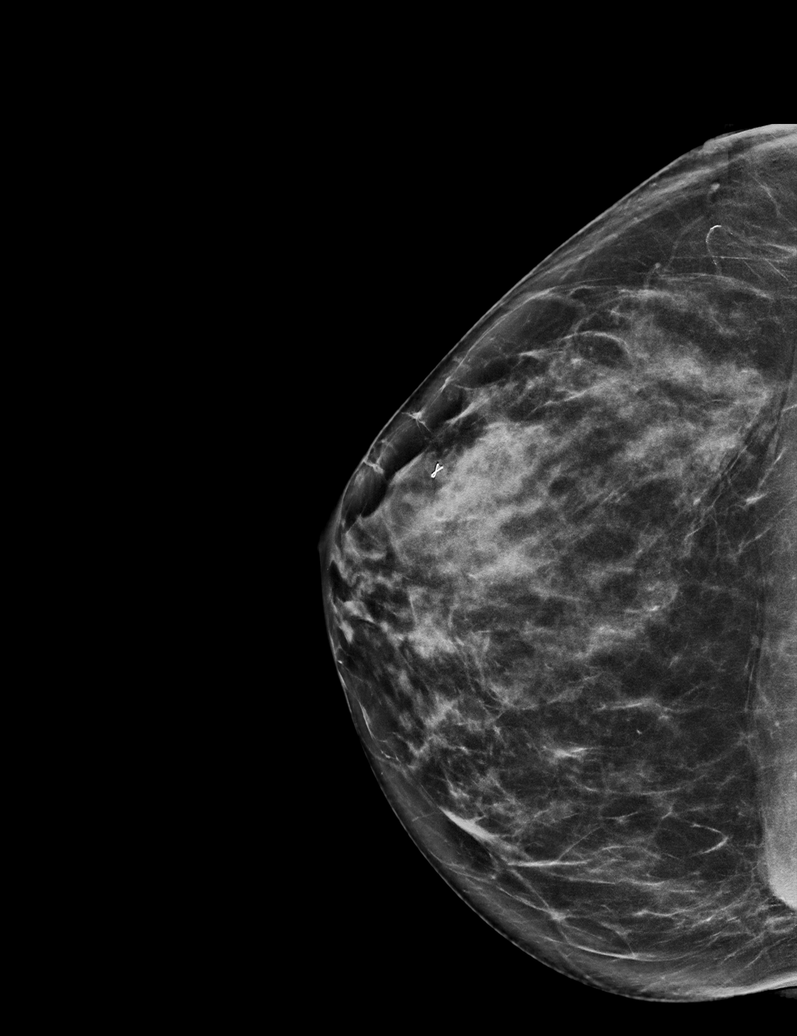

[L ML synth-2D]
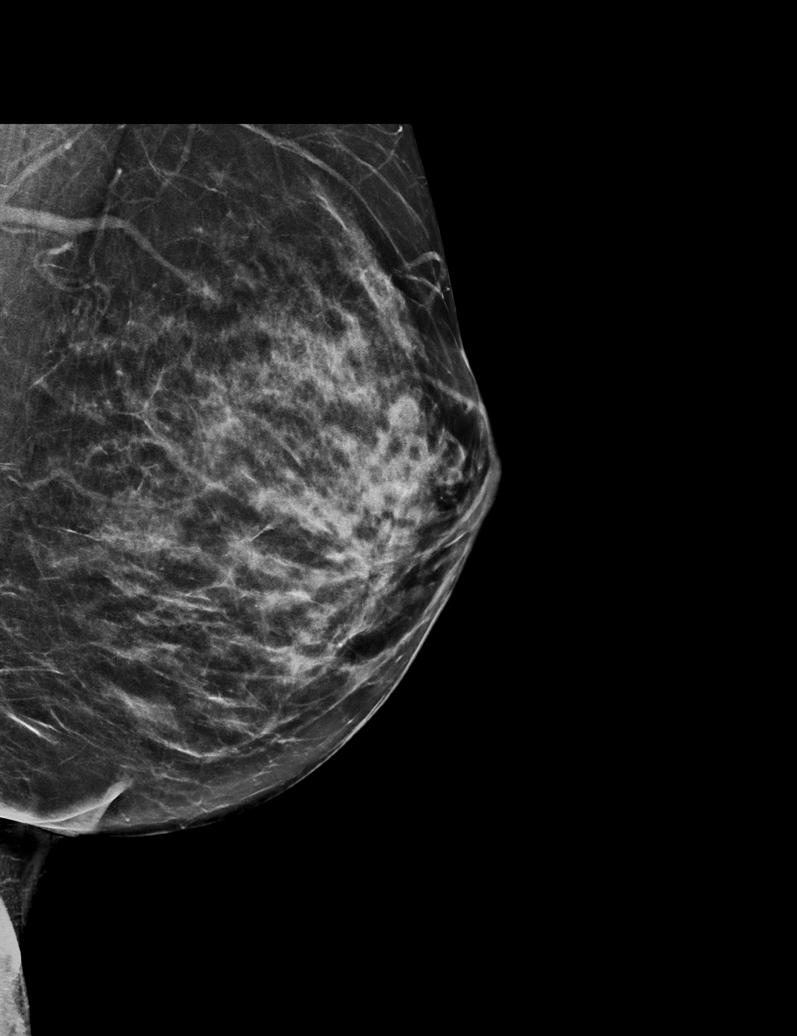

[L MLO synth-2D]
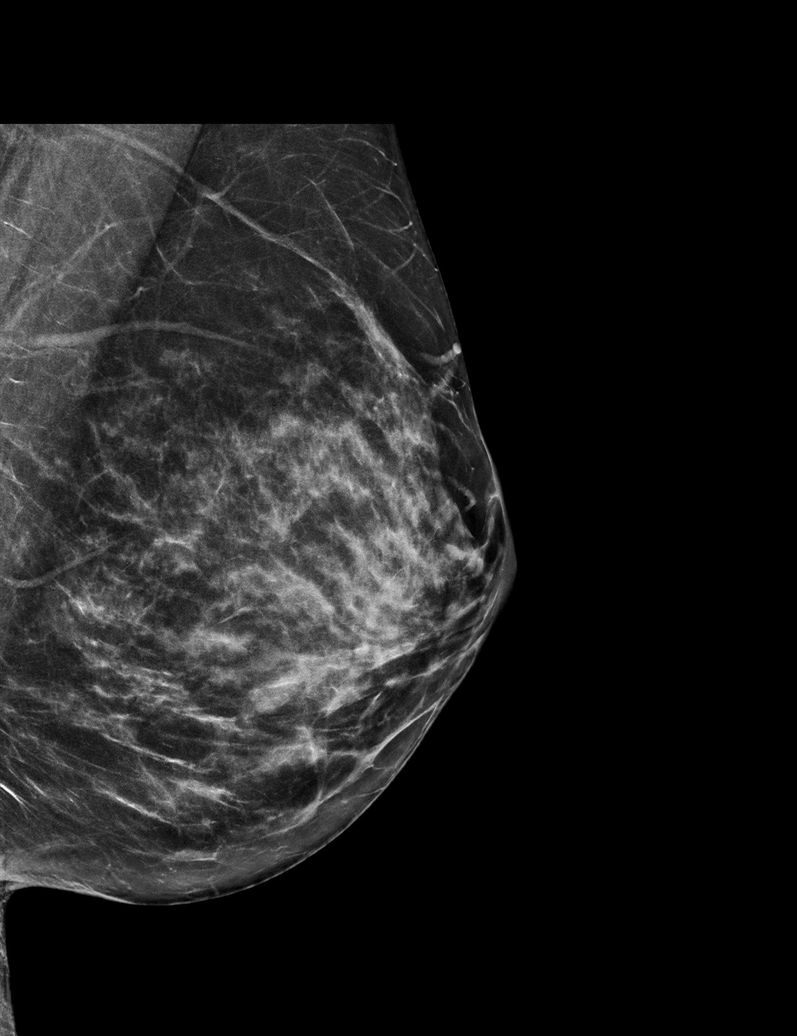

[6 of 36 positions shown; findings below may reference images not displayed]

ACR Breast Density Category c: The breast tissue is heterogeneously
dense, which may obscure small masses.
FINDINGS: Stable fibroglandular pattern within the right and left breast.
Within the medial posterior left breast on the CC view there is
focal density which is favored to represent outpouching of the
pectoralis muscle. Stable position biopsy clip within the lateral
right breast anterior depth

Mammographic images were processed with CAD.

Targeted ultrasound is performed, showing no discrete solid or
cystic mass within the medial left breast.

Within the right breast 8 o'clock position 2 cm from nipple the
previously described mass has resolved. The biopsy marking clip is
visualized.
IMPRESSION: No mammographic evidence for malignancy.

Interval resolution of previously described masses in the right
breast compatible with resolved abscess.

RECOMMENDATION:
Screening mammogram in one year.(Code:[id number]).

Patient was instructed to return for additional imaging evaluation
at the symptomatology in the right breast returns.

I have discussed the findings and recommendations with the patient.
If applicable, a reminder letter will be sent to the patient
regarding the next appointment.

BI-RADS CATEGORY  2: Benign.

## 2022-03-29 ENCOUNTER — Other Ambulatory Visit: Payer: Self-pay | Admitting: Obstetrics and Gynecology

## 2022-03-29 DIAGNOSIS — Z1231 Encounter for screening mammogram for malignant neoplasm of breast: Secondary | ICD-10-CM

## 2022-04-07 ENCOUNTER — Ambulatory Visit
Admission: RE | Admit: 2022-04-07 | Discharge: 2022-04-07 | Disposition: A | Discharge status: Home / Self Care | Payer: BC Managed Care – PPO | Source: Ambulatory Visit | Attending: Obstetrics and Gynecology | Admitting: Obstetrics and Gynecology

## 2022-04-07 DIAGNOSIS — Z1231 Encounter for screening mammogram for malignant neoplasm of breast: Secondary | ICD-10-CM

## 2022-04-11 ENCOUNTER — Other Ambulatory Visit: Payer: Self-pay | Admitting: Obstetrics and Gynecology

## 2022-04-11 DIAGNOSIS — R928 Other abnormal and inconclusive findings on diagnostic imaging of breast: Secondary | ICD-10-CM

## 2022-04-13 ENCOUNTER — Encounter: Payer: Self-pay | Admitting: Obstetrics and Gynecology

## 2022-04-21 ENCOUNTER — Ambulatory Visit
Admission: RE | Admit: 2022-04-21 | Discharge: 2022-04-21 | Disposition: A | Discharge status: Home / Self Care | Payer: BC Managed Care – PPO | Source: Ambulatory Visit | Attending: Obstetrics and Gynecology | Admitting: Obstetrics and Gynecology

## 2022-04-21 DIAGNOSIS — R928 Other abnormal and inconclusive findings on diagnostic imaging of breast: Secondary | ICD-10-CM

## 2022-07-05 ENCOUNTER — Ambulatory Visit: Payer: BC Managed Care – PPO | Admitting: Radiology

## 2022-07-05 ENCOUNTER — Ambulatory Visit: Payer: BC Managed Care – PPO | Admitting: Obstetrics and Gynecology

## 2022-09-22 ENCOUNTER — Other Ambulatory Visit: Payer: Self-pay

## 2022-09-22 DIAGNOSIS — N951 Menopausal and female climacteric states: Secondary | ICD-10-CM

## 2022-09-22 MED ORDER — GABAPENTIN 300 MG PO CAPS: 300.0000 mg | ORAL_CAPSULE | Freq: Every day | ORAL | 0 refills | Status: DC

## 2022-09-22 NOTE — Telephone Encounter (Signed)
Med refill request: gabapentin 300mg  Last AEX: 07/28/21 Dr. Oscar La Next AEX: none scheduled Last MMG (if hormonal med) n/a Refill sent to provider for approval.

## 2023-03-21 ENCOUNTER — Ambulatory Visit: Admitting: Nurse Practitioner

## 2023-03-21 ENCOUNTER — Encounter: Payer: Self-pay | Admitting: Nurse Practitioner

## 2023-03-21 VITALS — BP 120/76 | HR 80 | Resp 14 | Ht 62.0 in | Wt 139.0 lb

## 2023-03-21 DIAGNOSIS — N898 Other specified noninflammatory disorders of vagina: Secondary | ICD-10-CM | POA: Diagnosis not present

## 2023-03-21 DIAGNOSIS — N951 Menopausal and female climacteric states: Secondary | ICD-10-CM

## 2023-03-21 DIAGNOSIS — N390 Urinary tract infection, site not specified: Secondary | ICD-10-CM | POA: Diagnosis not present

## 2023-03-21 DIAGNOSIS — R300 Dysuria: Secondary | ICD-10-CM | POA: Diagnosis not present

## 2023-03-21 DIAGNOSIS — N941 Unspecified dyspareunia: Secondary | ICD-10-CM

## 2023-03-21 LAB — URINALYSIS, COMPLETE W/RFL CULTURE
Bacteria, UA: NONE SEEN /HPF
Bilirubin Urine: NEGATIVE
Glucose, UA: NEGATIVE
Hgb urine dipstick: NEGATIVE
Hyaline Cast: NONE SEEN /LPF
Ketones, ur: NEGATIVE
Leukocyte Esterase: NEGATIVE
Nitrites, Initial: NEGATIVE
Protein, ur: NEGATIVE
RBC / HPF: NONE SEEN /HPF (ref 0–2)
Specific Gravity, Urine: 1.015 (ref 1.001–1.035)
WBC, UA: NONE SEEN /HPF (ref 0–5)
pH: 7 (ref 5.0–8.0)

## 2023-03-21 LAB — WET PREP FOR TRICH, YEAST, CLUE

## 2023-03-21 LAB — NO CULTURE INDICATED

## 2023-03-21 MED ORDER — ESTRADIOL 0.1 MG/GM VA CREA
1.0000 g | TOPICAL_CREAM | VAGINAL | 0 refills | Status: DC
Start: 2023-03-22 — End: 2023-05-07

## 2023-03-21 MED ORDER — GABAPENTIN 300 MG PO CAPS: 300.0000 mg | ORAL_CAPSULE | Freq: Every day | ORAL | 0 refills | Status: DC

## 2023-03-21 MED ORDER — NITROFURANTOIN MACROCRYSTAL 50 MG PO CAPS: ORAL_CAPSULE | ORAL | 0 refills | Status: DC

## 2023-03-21 NOTE — Progress Notes (Signed)
   Acute Office Visit  Subjective:    Patient ID: Angel Silva, female    DOB: [birth year], 58 y.o.   MRN: [id number]   HPI 58 y.o. presents today for vaginal irritation/yeast symptoms x 3 weeks. Treated with OTC monistat about 2 weeks ago but now symptoms are worse and include new onset back pain and lower abdominal pain x 2 days. Some burning with urination and frequency. Took prn Macrobid once but no improvement. H/O postcoital UTIs, needs refill. Very sexually active. Has noticed some discomfort with intercourse. Also needs refill on gabapentin for night sweats/sleep.   Patient's last menstrual period was 01/03/2000.    Review of Systems  Constitutional: Negative.  Negative for chills and fever.  Gastrointestinal:  Positive for abdominal pain (Lower).  Genitourinary:  Positive for dysuria, frequency, urgency, vaginal discharge and vaginal pain (Irritation). Negative for flank pain, hematuria and pelvic pain.       Objective:    Physical Exam Constitutional:      Appearance: Normal appearance.  Genitourinary:    General: Normal vulva.     Vagina: No vaginal discharge or erythema.     BP 120/76   Pulse 80   Resp 14   Ht 5\' 2"  (1.575 m)   Wt 139 lb (63 kg)   LMP 01/03/2000   BMI 25.42 kg/m  Wt Readings from Last 3 Encounters:  03/21/23 139 lb (63 kg)  06/28/21 141 lb (64 kg)  08/18/20 141 lb (64 kg)        Patient informed chaperone available to be present for breast and/or pelvic exam. Patient has requested no chaperone to be present. Patient has been advised what will be completed during breast and pelvic exam.   Wet prep negative  UA negative  Assessment & Plan:   Problem List Items Addressed This Visit   None Visit Diagnoses       Menopausal vaginal dryness    -  Primary   Relevant Medications   estradiol (ESTRACE VAGINAL) 0.1 MG/GM vaginal cream (Start on 03/22/2023)     Dysuria       Relevant Orders   Urinalysis,Complete w/RFL Culture      Vaginal irritation       Relevant Orders   WET PREP FOR TRICH, YEAST, CLUE     Postcoital UTI       Relevant Medications   nitrofurantoin (MACRODANTIN) 50 MG capsule     Dyspareunia in female       Relevant Medications   estradiol (ESTRACE VAGINAL) 0.1 MG/GM vaginal cream (Start on 03/22/2023)     Vasomotor symptoms due to menopause       Relevant Medications   gabapentin (NEURONTIN) 300 MG capsule      Plan: Negative wet prep, UA and exam. Discussed vaginal changes that can occur with menopause. Having some discomfort with intercourse. Will try vaginal estrogen - Use nightly x 1 week, then decrease to twice weekly. Short-term refills provided for Macrodantin and Gabapentin. Aware she is due for annual exam.   Return if symptoms worsen or fail to improve.    Olivia Mackie DNP, 9:02 AM 03/21/2023

## 2023-03-31 ENCOUNTER — Other Ambulatory Visit: Payer: Self-pay | Admitting: Nurse Practitioner

## 2023-03-31 DIAGNOSIS — N951 Menopausal and female climacteric states: Secondary | ICD-10-CM

## 2023-04-02 NOTE — Telephone Encounter (Signed)
 Medication refill request: gabapentin 300mg  Last AEX:  06-28-21 Last OV: 03-21-23 Next AEX: 06-07-23 Last MMG (if hormonal medication request): n/a Refill authorized: Rx was already sent on 03-21-23 #90 with 0 refills. Rx to be denied today

## 2023-05-04 ENCOUNTER — Other Ambulatory Visit: Payer: Self-pay | Admitting: Nurse Practitioner

## 2023-05-04 DIAGNOSIS — N941 Unspecified dyspareunia: Secondary | ICD-10-CM

## 2023-05-04 DIAGNOSIS — N951 Menopausal and female climacteric states: Secondary | ICD-10-CM

## 2023-05-04 NOTE — Telephone Encounter (Signed)
 Medication refill request: estrace  0.1mg  vaginal cream Last AEX:  06-28-21 Next AEX: 06-07-23 Last MMG (if hormonal medication request): 04-07-22 bilateral & 04-21-22 left breast u/s birads 1:neg Refill authorized: please approve or deny as appropriate

## 2023-06-07 ENCOUNTER — Ambulatory Visit: Admitting: Nurse Practitioner

## 2023-06-24 ENCOUNTER — Other Ambulatory Visit: Payer: Self-pay | Admitting: Nurse Practitioner

## 2023-06-24 DIAGNOSIS — N941 Unspecified dyspareunia: Secondary | ICD-10-CM

## 2023-06-24 DIAGNOSIS — N951 Menopausal and female climacteric states: Secondary | ICD-10-CM

## 2023-06-25 NOTE — Telephone Encounter (Signed)
 Med refill request: estradiol  and gabapentin  Last AEX: 06/28/21 JJ Next AEX: 07/27/23 TW Last MMG (if hormonal med) 04/21/22 Refill authorized: Estradiol  - Last Rx sent #42.5 g with zero refills on 05/07/23 TW. Gabapentin  - Last Rx sent #90 with zero refills on 03/21/23 TW. Please approve or deny.

## 2023-07-27 ENCOUNTER — Encounter: Payer: Self-pay | Admitting: Nurse Practitioner

## 2023-07-27 ENCOUNTER — Ambulatory Visit: Admitting: Nurse Practitioner

## 2023-07-27 VITALS — BP 111/70 | HR 69 | Ht 62.0 in | Wt 136.6 lb

## 2023-07-27 DIAGNOSIS — N941 Unspecified dyspareunia: Secondary | ICD-10-CM

## 2023-07-27 DIAGNOSIS — N390 Urinary tract infection, site not specified: Secondary | ICD-10-CM | POA: Diagnosis not present

## 2023-07-27 DIAGNOSIS — Z01419 Encounter for gynecological examination (general) (routine) without abnormal findings: Secondary | ICD-10-CM

## 2023-07-27 DIAGNOSIS — Z79890 Hormone replacement therapy (postmenopausal): Secondary | ICD-10-CM

## 2023-07-27 DIAGNOSIS — N951 Menopausal and female climacteric states: Secondary | ICD-10-CM | POA: Diagnosis not present

## 2023-07-27 DIAGNOSIS — Z1331 Encounter for screening for depression: Secondary | ICD-10-CM

## 2023-07-27 DIAGNOSIS — Z780 Asymptomatic menopausal state: Secondary | ICD-10-CM

## 2023-07-27 MED ORDER — IMVEXXY MAINTENANCE PACK 10 MCG VA INST: 1.0000 | VAGINAL_INSERT | VAGINAL | 11 refills | Status: AC

## 2023-07-27 MED ORDER — GABAPENTIN 100 MG PO CAPS: 100.0000 mg | ORAL_CAPSULE | Freq: Three times a day (TID) | ORAL | 3 refills | Status: DC

## 2023-07-27 MED ORDER — NITROFURANTOIN MACROCRYSTAL 50 MG PO CAPS: ORAL_CAPSULE | ORAL | 0 refills | Status: AC

## 2023-07-27 MED ORDER — PROGESTERONE MICRONIZED 100 MG PO CAPS: 100.0000 mg | ORAL_CAPSULE | Freq: Every evening | ORAL | 3 refills | Status: AC

## 2023-07-27 NOTE — Progress Notes (Signed)
 Angel Silva [birth year] [id number]   History:  58 y.o. [id number] presents for annual exam. S/P 2012 hysterectomy. On HRT with [place], testosterone pellets, estrogen pellets, prometrium. Has not had Prometrium for a few weeks due to communication issues between provider and pharmacy. Asking to have filled today. Not sleeping well without it. Takes Gabapentin  for sleep. H/O postcoital UTIs, takes Macrodantin  as needed. Using vaginal estrogen for dryness and painful intercourse, good management but does not like the messiness and wants to discuss other options. Normal pap history.   Gynecologic History Patient's last menstrual period was 01/03/2000.   Contraception/Family planning: status post hysterectomy Sexually active: Yes  Health Maintenance Last Pap: 2012 Last mammogram: 04/07/2022. Results were: Left breast asymmetry, follow up imaging normal Last colonoscopy: 07/19/2018. Results were: Poor prep, 1-year recall  Last Dexa: Never     07/27/2023    3:52 PM  Depression screen PHQ 2/9  Decreased Interest 0  Down, Depressed, Hopeless 0  PHQ - 2 Score 0     Past medical history, past surgical history, family history and social history were all reviewed and documented in the EPIC chart. Married. Works for [employer].   ROS:  A ROS was performed and pertinent positives and negatives are included.  Exam:  Vitals:   07/27/23 1552  BP: 111/70  Pulse: 69  SpO2: 99%  Weight: 136 lb 9.6 oz (62 kg)  Height: 5' 2 (1.575 m)   Body mass index is 24.98 kg/m.  General appearance:  Normal Thyroid :  Symmetrical, normal in size, without palpable masses or nodularity. Respiratory  Auscultation:  Clear without wheezing or rhonchi Cardiovascular  Auscultation:  Regular rate, without rubs, murmurs or gallops  Edema/varicosities:  Not grossly evident Abdominal  Soft,nontender, without masses, guarding or rebound.  Liver/spleen:  No organomegaly noted  Hernia:  None  appreciated  Skin  Inspection:  Grossly normal Breasts: Examined lying and sitting.   Right: Without masses, retractions, nipple discharge or axillary adenopathy.   Left: Without masses, retractions, nipple discharge or axillary adenopathy. Pelvic: External genitalia:  no lesions              Urethra:  normal appearing urethra with no masses, tenderness or lesions              Bartholins and Skenes: normal                 Vagina: normal appearing vagina with normal color and discharge, no lesions              Cervix: absent Bimanual Exam:  Uterus:  absent              Adnexa: no mass, fullness, tenderness              Rectovaginal: Deferred              Anus:  normal, no lesions  Dereck Keas, CMA present as chaperone.   Assessment/Plan:  57 y.o. [id number] for annual exam.   Well female exam with routine gynecological exam - Education provided on SBEs, importance of preventative screenings, current guidelines, high calcium diet, regular exercise, and multivitamin daily. Labs with PCP.   Postcoital UTI - Plan: nitrofurantoin  (MACRODANTIN ) 50 MG capsule as needed with intercourse.   Vasomotor symptoms due to menopause - Plan: gabapentin  (NEURONTIN ) 100 MG capsule nightly. Currently takes 300 mg, will try to decrease.   Menopausal vaginal dryness - Plan: Estradiol  (IMVEXXY  MAINTENANCE PACK) 10 MCG INST twice  weekly. Aware sent to specialty pharmacy.   Postmenopausal hormone therapy - Plan: progesterone (PROMETRIUM) 100 MG capsule nightly. Testosterone + Estrogen pellets by [facility].   Dyspareunia in female - Plan: Estradiol  (IMVEXXY  MAINTENANCE PACK) 10 MCG INST twice weekly.  Screening for cervical cancer - Normal Pap history.  No longer screening per guidelines.   Screening for breast cancer - Normal mammogram history. Overdue and encouraged to schedule. Normal breast exam today.  Screening for colon cancer - 2020 colonoscopy.  Screening for osteoporosis - Average risk. Will plan  DXA at age 55.   Return in about 1 year (around 07/26/2024) for Annual.     Annabella DELENA Shutter DNP, 4:10 PM 07/27/2023

## 2023-08-24 ENCOUNTER — Other Ambulatory Visit: Payer: Self-pay | Admitting: Nurse Practitioner

## 2023-08-24 DIAGNOSIS — N390 Urinary tract infection, site not specified: Secondary | ICD-10-CM

## 2023-08-24 NOTE — Telephone Encounter (Signed)
 Med refill request: nitrofurantoin  50 mg Last AEX: 07/27/23 Next AEX: 07/30/24 Last MMG (if hormonal med) n/a Last refill: 07/27/2023 Refill authorized: Please Advise?

## 2023-08-27 ENCOUNTER — Other Ambulatory Visit: Payer: Self-pay | Admitting: Nurse Practitioner

## 2023-08-27 DIAGNOSIS — Z1231 Encounter for screening mammogram for malignant neoplasm of breast: Secondary | ICD-10-CM

## 2023-08-30 ENCOUNTER — Ambulatory Visit (HOSPITAL_COMMUNITY)
Admission: RE | Admit: 2023-08-30 | Discharge: 2023-08-30 | Disposition: A | Discharge status: Home / Self Care | Source: Ambulatory Visit

## 2023-08-30 ENCOUNTER — Other Ambulatory Visit (HOSPITAL_COMMUNITY): Payer: Self-pay

## 2023-08-30 DIAGNOSIS — M5441 Lumbago with sciatica, right side: Secondary | ICD-10-CM | POA: Diagnosis present

## 2023-09-14 ENCOUNTER — Ambulatory Visit

## 2023-09-21 ENCOUNTER — Ambulatory Visit
Admission: RE | Admit: 2023-09-21 | Discharge: 2023-09-21 | Disposition: A | Discharge status: Home / Self Care | Source: Ambulatory Visit | Attending: Nurse Practitioner | Admitting: Nurse Practitioner

## 2023-09-21 DIAGNOSIS — Z1231 Encounter for screening mammogram for malignant neoplasm of breast: Secondary | ICD-10-CM

## 2023-10-05 ENCOUNTER — Ambulatory Visit (INDEPENDENT_AMBULATORY_CARE_PROVIDER_SITE_OTHER): Admitting: Emergency Medicine

## 2023-12-04 ENCOUNTER — Other Ambulatory Visit: Payer: Self-pay | Admitting: Nurse Practitioner

## 2023-12-04 DIAGNOSIS — N951 Menopausal and female climacteric states: Secondary | ICD-10-CM

## 2023-12-04 NOTE — Telephone Encounter (Signed)
 Med refill request:   gabapentin  (NEURONTIN ) 100 MG capsule  Start:  07/27/23 Disp:  90 capsules  Refills:  3  Last AEX:  07/27/23 Next AEX:  07/30/24 Last MMG (if hormonal med):  N/A Refill authorized? Please Advise.

## 2024-01-06 ENCOUNTER — Other Ambulatory Visit: Payer: Self-pay | Admitting: Nurse Practitioner

## 2024-01-06 DIAGNOSIS — N951 Menopausal and female climacteric states: Secondary | ICD-10-CM

## 2024-01-07 NOTE — Telephone Encounter (Signed)
 Med refill request:   gabapentin  (NEURONTIN ) 100 MG capsule Start:  12/05/23 Disp:  90 capsules Refills:  0  Last AEX:  07/27/23 Next AEX:  07/30/24 Last MMG (if hormonal med):  N/A Refill authorized? Please Advise.

## 2024-01-16 ENCOUNTER — Other Ambulatory Visit: Payer: Self-pay | Admitting: Nurse Practitioner

## 2024-01-16 ENCOUNTER — Encounter: Payer: Self-pay | Admitting: Nurse Practitioner

## 2024-01-16 DIAGNOSIS — B009 Herpesviral infection, unspecified: Secondary | ICD-10-CM

## 2024-01-16 MED ORDER — VALACYCLOVIR HCL 1 G PO TABS: 1000.0000 mg | ORAL_TABLET | Freq: Two times a day (BID) | ORAL | 0 refills | Status: AC

## 2024-01-16 NOTE — Telephone Encounter (Signed)
 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

## 2024-02-01 ENCOUNTER — Other Ambulatory Visit (HOSPITAL_COMMUNITY): Payer: Self-pay

## 2024-02-01 DIAGNOSIS — Z823 Family history of stroke: Secondary | ICD-10-CM

## 2024-03-14 ENCOUNTER — Other Ambulatory Visit (HOSPITAL_COMMUNITY)

## 2024-07-30 ENCOUNTER — Ambulatory Visit: Admitting: Nurse Practitioner
